# Patient Record
Sex: Female | Born: 1980
Health system: Southern US, Community
[De-identification: ages and names within clinical notes are randomized; demographics above are authoritative.]

## PROBLEM LIST (undated history)

## (undated) DIAGNOSIS — I10 Essential (primary) hypertension: Secondary | ICD-10-CM

## (undated) DIAGNOSIS — E119 Type 2 diabetes mellitus without complications: Secondary | ICD-10-CM

## (undated) DIAGNOSIS — E785 Hyperlipidemia, unspecified: Secondary | ICD-10-CM

## (undated) HISTORY — DX: Hyperlipidemia, unspecified: E78.5

## (undated) HISTORY — PX: OTHER SURGICAL HISTORY: SHX169

## (undated) HISTORY — DX: Essential (primary) hypertension: I10

## (undated) HISTORY — PX: CHOLECYSTECTOMY: SHX55

## (undated) HISTORY — DX: Type 2 diabetes mellitus without complications: E11.9

---
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
## 2001-12-02 ENCOUNTER — Ambulatory Visit (HOSPITAL_COMMUNITY): Admission: RE | Admit: 2001-12-02 | Discharge: 2001-12-02 | Payer: Self-pay

## 2002-01-02 ENCOUNTER — Ambulatory Visit (HOSPITAL_COMMUNITY): Admission: RE | Admit: 2002-01-02 | Discharge: 2002-01-02 | Payer: Self-pay

## 2002-01-07 ENCOUNTER — Encounter: Payer: Self-pay | Admitting: Emergency Medicine

## 2002-01-07 ENCOUNTER — Emergency Department (HOSPITAL_COMMUNITY): Admission: EM | Admit: 2002-01-07 | Discharge: 2002-01-07 | Payer: Self-pay | Admitting: Emergency Medicine

## 2002-03-17 ENCOUNTER — Encounter (HOSPITAL_COMMUNITY): Admission: RE | Admit: 2002-03-17 | Discharge: 2002-04-16 | Payer: Self-pay

## 2002-04-03 ENCOUNTER — Inpatient Hospital Stay (HOSPITAL_COMMUNITY): Admission: AD | Admit: 2002-04-03 | Discharge: 2002-04-06 | Payer: Self-pay

## 2002-04-13 ENCOUNTER — Encounter: Admission: RE | Admit: 2002-04-13 | Discharge: 2002-04-13 | Payer: Self-pay | Admitting: *Deleted

## 2002-04-17 ENCOUNTER — Encounter (HOSPITAL_COMMUNITY): Admission: RE | Admit: 2002-04-17 | Discharge: 2002-05-17 | Payer: Self-pay

## 2002-05-18 ENCOUNTER — Encounter (HOSPITAL_COMMUNITY): Admission: RE | Admit: 2002-05-18 | Discharge: 2002-05-22 | Payer: Self-pay

## 2002-05-24 ENCOUNTER — Inpatient Hospital Stay (HOSPITAL_COMMUNITY): Admission: AD | Admit: 2002-05-24 | Discharge: 2002-05-26 | Payer: Self-pay

## 2002-06-10 ENCOUNTER — Emergency Department (HOSPITAL_COMMUNITY): Admission: EM | Admit: 2002-06-10 | Discharge: 2002-06-11 | Payer: Self-pay | Admitting: Emergency Medicine

## 2002-06-11 ENCOUNTER — Encounter: Payer: Self-pay | Admitting: Emergency Medicine

## 2002-07-05 ENCOUNTER — Ambulatory Visit (HOSPITAL_COMMUNITY): Admission: RE | Admit: 2002-07-05 | Discharge: 2002-07-06 | Payer: Self-pay | Admitting: General Surgery

## 2002-07-05 ENCOUNTER — Encounter (INDEPENDENT_AMBULATORY_CARE_PROVIDER_SITE_OTHER): Payer: Self-pay | Admitting: Specialist

## 2002-07-05 ENCOUNTER — Encounter: Payer: Self-pay | Admitting: General Surgery

## 2002-09-05 ENCOUNTER — Other Ambulatory Visit: Admission: RE | Admit: 2002-09-05 | Discharge: 2002-09-05 | Payer: Self-pay

## 2004-05-09 ENCOUNTER — Other Ambulatory Visit: Admission: RE | Admit: 2004-05-09 | Discharge: 2004-05-09 | Payer: Self-pay | Admitting: Obstetrics and Gynecology

## 2004-07-28 ENCOUNTER — Inpatient Hospital Stay (HOSPITAL_COMMUNITY): Admission: AD | Admit: 2004-07-28 | Discharge: 2004-07-28 | Payer: Self-pay | Admitting: Obstetrics and Gynecology

## 2004-08-06 ENCOUNTER — Inpatient Hospital Stay (HOSPITAL_COMMUNITY): Admission: AD | Admit: 2004-08-06 | Discharge: 2004-08-06 | Payer: Self-pay | Admitting: Obstetrics and Gynecology

## 2004-08-11 ENCOUNTER — Inpatient Hospital Stay (HOSPITAL_COMMUNITY): Admission: AD | Admit: 2004-08-11 | Discharge: 2004-08-11 | Payer: Self-pay | Admitting: Obstetrics and Gynecology

## 2004-08-13 ENCOUNTER — Inpatient Hospital Stay (HOSPITAL_COMMUNITY): Admission: AD | Admit: 2004-08-13 | Discharge: 2004-08-13 | Payer: Self-pay | Admitting: Obstetrics and Gynecology

## 2004-08-19 ENCOUNTER — Inpatient Hospital Stay (HOSPITAL_COMMUNITY): Admission: AD | Admit: 2004-08-19 | Discharge: 2004-08-23 | Payer: Self-pay | Admitting: Obstetrics and Gynecology

## 2004-09-25 ENCOUNTER — Other Ambulatory Visit: Admission: RE | Admit: 2004-09-25 | Discharge: 2004-09-25 | Payer: Self-pay | Admitting: Obstetrics and Gynecology

## 2005-09-18 ENCOUNTER — Encounter: Admission: RE | Admit: 2005-09-18 | Discharge: 2005-09-18 | Payer: Self-pay | Admitting: Cardiovascular Disease

## 2006-09-10 ENCOUNTER — Other Ambulatory Visit: Admission: RE | Admit: 2006-09-10 | Discharge: 2006-09-10 | Payer: Self-pay | Admitting: Obstetrics and Gynecology

## 2008-12-26 ENCOUNTER — Emergency Department (HOSPITAL_COMMUNITY): Admission: EM | Admit: 2008-12-26 | Discharge: 2008-12-26 | Payer: Self-pay | Admitting: Family Medicine

## 2009-01-01 ENCOUNTER — Emergency Department (HOSPITAL_COMMUNITY): Admission: EM | Admit: 2009-01-01 | Discharge: 2009-01-01 | Payer: Self-pay | Admitting: Emergency Medicine

## 2009-01-05 ENCOUNTER — Emergency Department (HOSPITAL_COMMUNITY): Admission: EM | Admit: 2009-01-05 | Discharge: 2009-01-05 | Payer: Self-pay | Admitting: Emergency Medicine

## 2010-06-27 NOTE — Discharge Summary (Signed)
   NAME:  Lynn Anderson, Lynn Anderson                       ACCOUNT NO.:  [id number]   MEDICAL RECORD NO.:  [id number]                   PATIENT TYPE:  INP   LOCATION:  9155                                 FACILITY:  [facility]   PHYSICIAN:  Ronda Fairly. Galen Daft, M.D.              DATE OF BIRTH:  04/19/1980   DATE OF ADMISSION:  04/03/2002  DATE OF DISCHARGE:  04/06/2002                                 DISCHARGE SUMMARY   ADMISSION DIAGNOSIS:  Abnormal Doppler's.   PRINCIPAL DIAGNOSIS:  Abnormal Doppler's.   SECONDARY DIAGNOSIS:  Intrauterine growth restriction and oligohydramnios.   FINAL DIAGNOSIS:  Intrauterine growth restriction ad oligohydramnios.   COMPLICATIONS OF HOSPITALIZATION:  None.   CONDITION AT DISCHARGE:  Improved.   HOSPITAL COURSE:  The patient was admitted  on 04/03/02 for non reassuring  antenatal evaluation with oligohydramnios by ultrasound report and abnormal  umbilical artery Doppler's.  The patient had normal cerebral artery  Doppler's and the ultrasound fetal weight showed 1500-1600 grams with a  grade 2 placenta, this was 04/03/02.  The amniotic fluid index was 10.3 cm  but this was felt to be just above the fifth percentile.  The Doppler's  again were slightly abnormal.  The patient had no other extrinsic factors  and internal fetal medicine consult was performed during her  hospitalization, and NST testing and biophysical profile.  The patient did  well in the hospital, her tracing improved and she was discharged home on  the 26th.  We did a full evaluation and viral studies were performed.  All  of these results came back negative including hepatitis and lupus studies.  The patient did have slight anemia with a hemoglobin of 11.1 grams, a  slightly elevated white count at 13,000 but otherwise unremarkable labs.  Her creatinine clearance was normal after correction from the lab.  The  initial studies in the hospital suggested that there was a very low  creatinine clearance  but this was corrected and actually turned out to be a  high creatinine clearance when the math was improved.  The patient was given  TORCH titers also which were negative, and herpes was negative.  The patient  did have anticardiolipin's, they were negative.  ANA was negative.  The  patient was given instructions for medications and full instructions for  activity, wound care and follow up in the office.                                                Ronda Fairly. Galen Daft, M.D.    NJT/MEDQ  D:  05/05/2002  T:  05/06/2002  Job:  [id number]

## 2010-06-27 NOTE — Discharge Summary (Signed)
[name], Lynn Anderson NO.:  [id number]   MEDICAL RECORD NO.:  [id number]          PATIENT TYPE:  INP   LOCATION:  9121                          FACILITY:  [facility]   PHYSICIAN:  Rudy Jew. Ashley Royalty, M.D.DATE OF BIRTH:  [birth year]   DATE OF ADMISSION:  08/19/2004  DATE OF DISCHARGE:  08/23/2004                                 DISCHARGE SUMMARY   DISCHARGE DIAGNOSES:  1.  Transient hypertension.  2.  Normal spontaneous vaginal delivery of viable infant over intact      perineum.   HISTORY OF PRESENT ILLNESS:  The patient is a 30 year old gravida 3, para 1-  0-1-1 with an EDC of August 30, 2004.  Risk factors include late presentation  for prenatal care at 24 weeks.   LABORATORY DATA:  Blood type B positive, antibody screen negative, RPR,  HBsAg, HIV nonreactive.   HOSPITAL COURSE AND TREATMENT:  The patient was admitted August 19, 2004 for  induction of labor secondary to recent transient blood pressure elevations.  On admission, blood pressure was 140/84.  Cervical exam: Cervix was 4 cm,  75%, -2 to 3, vertex.  Rupture of membranes revealed clear fluid.  The  patient's labor was induced, PIH panel was obtained which was reassuring.  Uric acid 10.3.  The patient was delivered of a 7 pound, 9 ounce female  infant, Apgars 8 and 9 over intact perineum.  Nuchal cord.  There was  minimal right-sided periurethral __________  requiring a single suture.  Postpartum course was complicated by elevated blood pressure of 159/108.  The patient was sent to ICU for magnesium sulfate therapy and also labetalol  100 mg p.o. b.i.d.  On August 21, 2004, the [facility] panel was within normal  limits.  Uric acid had decreased.  The patient was stable to be discharged  in satisfactory condition on August 23, 2004.   DISPOSITION:  Followup in the office on July 17, 18 for blood pressure  check.  Labetalol 300 mg q.a.m., 200 in p.m.  Percocet, 5/3.25 15.  Motrin  600 mg q.i.d.      Elwyn Lade .  Hancock, N.P.    ______________________________  Rudy Jew Ashley Royalty, M.D.    MKH/MEDQ  D:  10/01/2004  T:  10/01/2004  Job:  [id number]

## 2010-06-27 NOTE — Op Note (Signed)
NAME:  Lynn Anderson, Lynn Anderson NO.:  [id number]   MEDICAL RECORD NO.:  [id number]                   PATIENT TYPE:  EMS   LOCATION:  MINO                                 FACILITY:  [facility]   PHYSICIAN:  Gita Kudo, M.D.              DATE OF BIRTH:  April 18, 1980   DATE OF PROCEDURE:  07/05/2002  DATE OF DISCHARGE:  06/11/2002                                 OPERATIVE REPORT   PROCEDURE:  Laparoscopic cholecystectomy.   SURGEON:  Gita Kudo, M.D.   ASSISTANT:  Donnie Coffin. Samuella Cota, M.D.   ANESTHESIA:  General endotracheal.   PREOPERATIVE DIAGNOSIS:  Gallstones.   POSTOPERATIVE DIAGNOSIS:  Gallstones.   INDICATIONS FOR PROCEDURE:  This 30 year old female recently pregnant,  developed abdominal pain.  A gallbladder ultrasound showed stones.  ALT was  elevated slightly, and other liver function studies were normal.   FINDINGS:  The gallbladder was thin-walled.  The cystic duct and artery were  normal in anatomy and in size.  A cholangiogram was normal to me.   DESCRIPTION OF PROCEDURE:  Under satisfactory general endotracheal  anesthesia, having received 1.0 g of Ancef, the patient's abdomen was  prepped and draped in a standard fashion.  A midline incision was made at  the umbilicus and carried into the peritoneum.  Controlled with a figure-of-  eight #0 Vicryl suture.  The operating Hussan port was inserted  and good  CO2 pneumoperitoneum established.  The camera was placed, and under direct  vision, through Marcaine-infiltrated incisions, two #5 ports were placed  laterally and a second #10 medially.  With good visualization, the graspers  through the lateral port gave excellent exposure, and we carefully dissected  the cystic duct and artery.  When these structures were circumferentially  identified, the artery was controlled with multiple metal clips, and a  single clip placed on the cystic duct near the gallbladder.  An incision was  made in the  cystic duct and a transcutaneous catheter passed, and  cholangiograms taken.  After interpreting them, the catheter was withdrawn,  and the duct controlled with multiple metal clips, and it and the artery  were divided.  The gallbladder was then dissected off of the liver bed from  below upwards using coagulating dissection for hemostasis.  When removed,  the liver bed was made dry by cautery, and lavaged with saline.  The camera moved to the upper port, and through the lower port a large  grasper was used to extract the gallbladder intact, and without spillage or  problem.  All sites were removed under direct vision after the CO2 was  released, and the area checked for hemostasis and lavaged with saline.  Then  the midline incision was infiltrated with Marcaine  and closed with the previous figure-of-eight, and a second interrupted #0  Vicryl.  The subcutaneous approximated with #4-0 Vicryl and Steri-Strips  applied to all incisions.  Dressings were  applied.  The patient went to the recovery room from the operating room in good  condition, without complications.                                                Gita Kudo, M.D.    MRL/MEDQ  D:  07/05/2002  T:  07/05/2002  Job:  [id number]

## 2010-07-24 IMAGING — CT CT HEAD W/O CM
1 of 2 series · 13 of 30 positions shown, 17 images · non-contrast
Comparison: 09/18/2005

CLINICAL DATA: Headaches, sinusitis

CT HEAD WITHOUT CONTRAST
TECHNIQUE: Contiguous axial images were obtained from the base of
the skull through the vertex without contrast.

[Series 2: brain · axial · 0.47mm/px · z∈[+129,+254]mm · 13 of 28 slices shown, 17 images]
[im 2/28  brain]
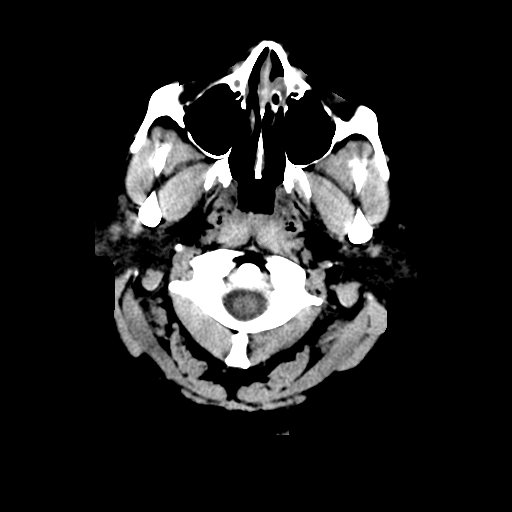
[im 2/28  bone]
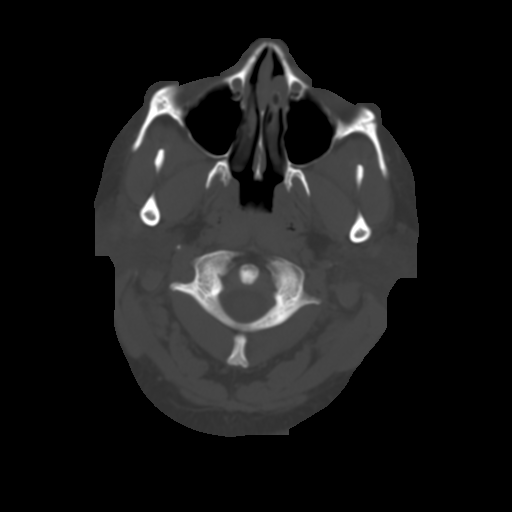
[im 4/28  brain]
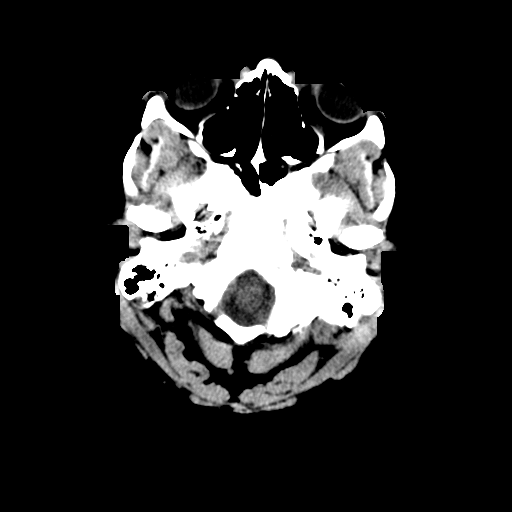
[im 6/28  brain]
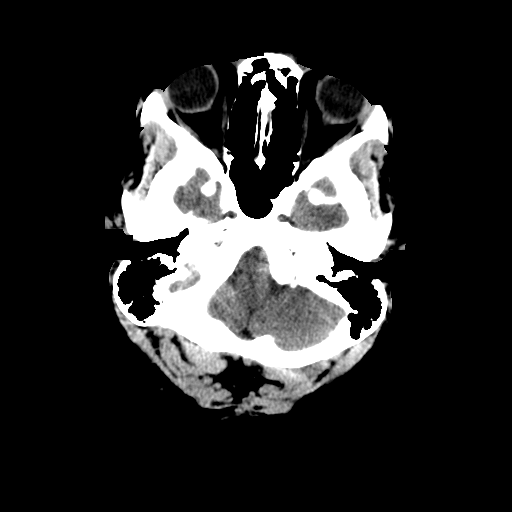
[im 8/28  brain]
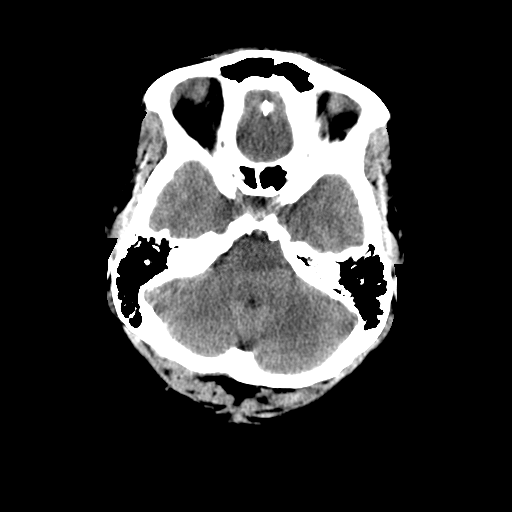
[im 10/28  brain]
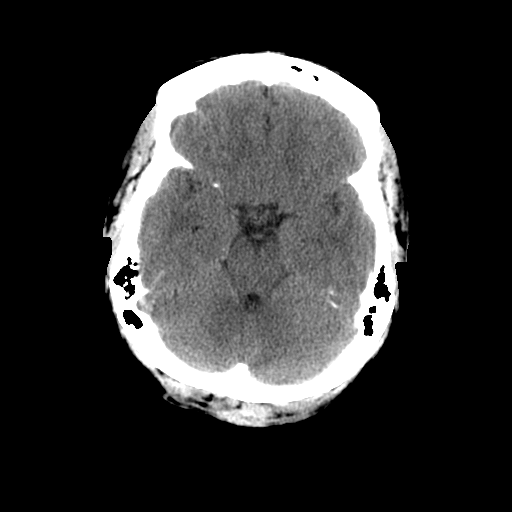
[im 10/28  bone]
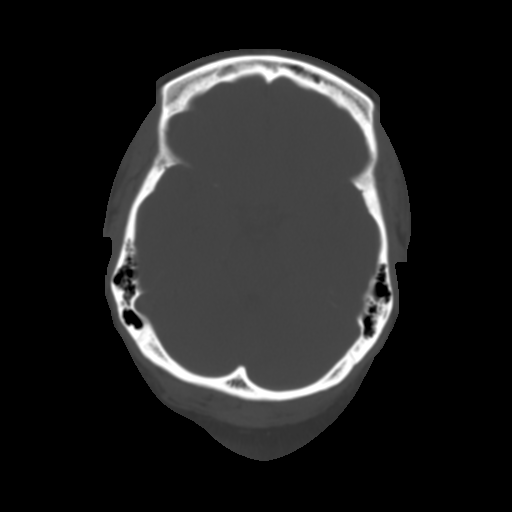
[im 12/28  brain]
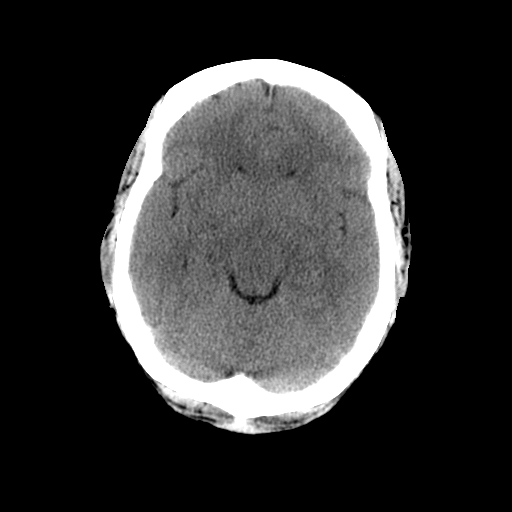
[im 14/28  brain]
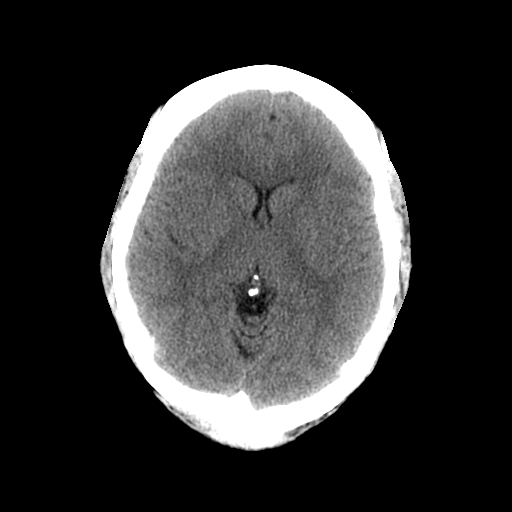
[im 16/28  brain]
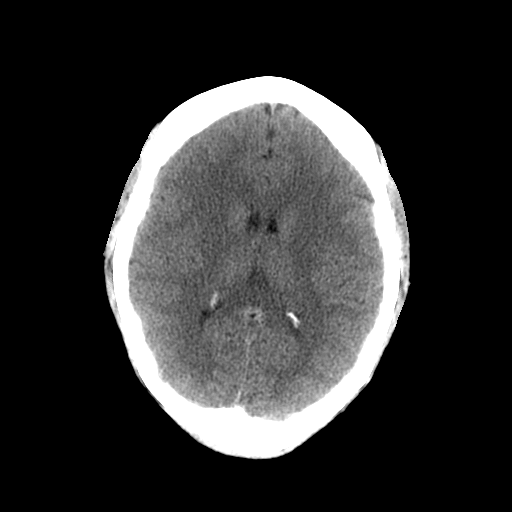
[im 18/28  brain]
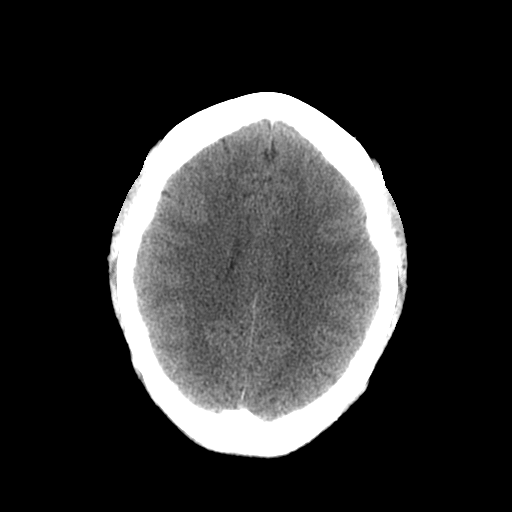
[im 18/28  bone]
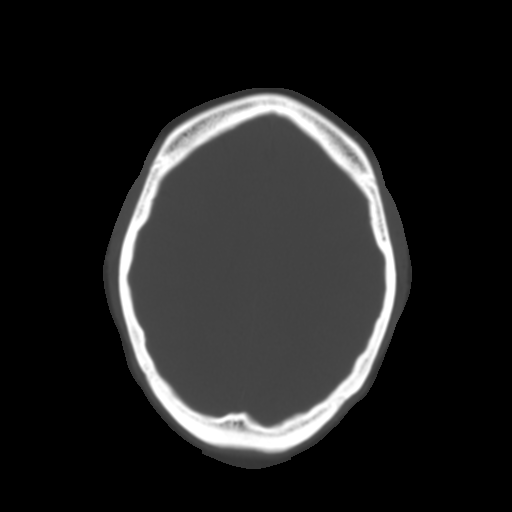
[im 20/28  brain]
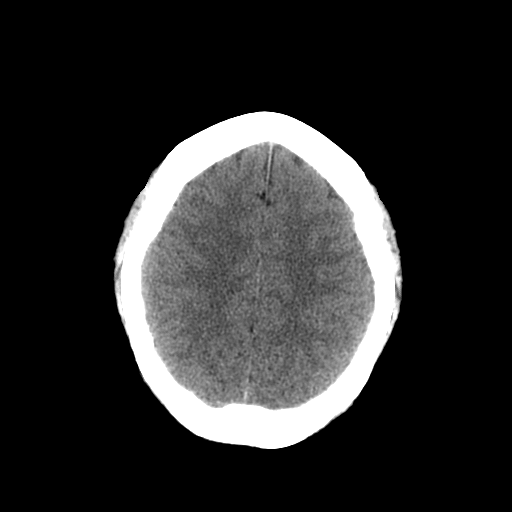
[im 22/28  brain]
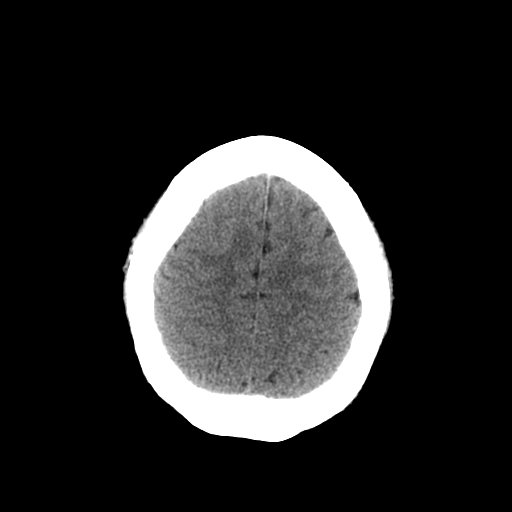
[im 24/28  brain]
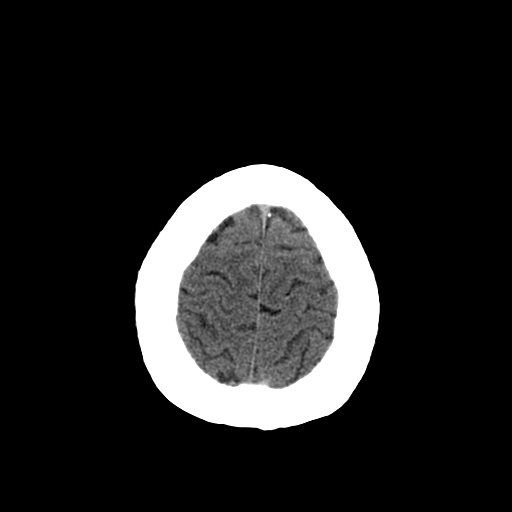
[im 26/28  brain]
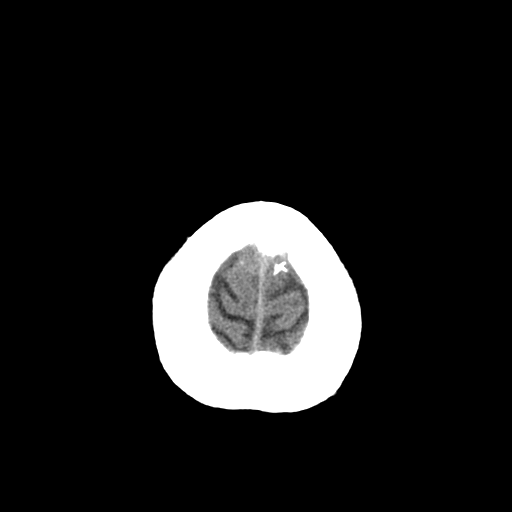
[im 26/28  bone]
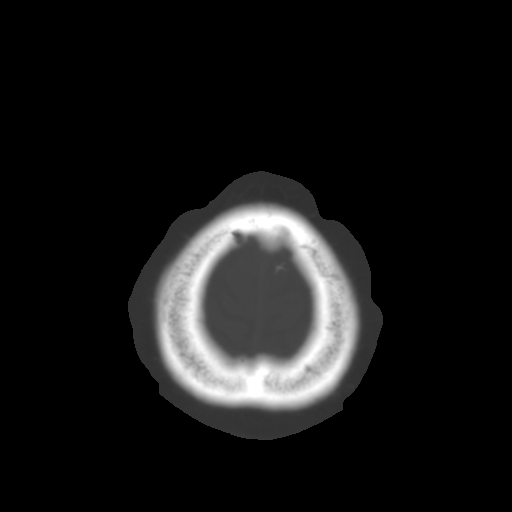

[13 of 30 positions shown; findings below may reference images not displayed]

FINDINGS: Normal ventricular morphology.
No midline shift or mass effect.
Normal appearance of brain parenchyma.
No intracranial hemorrhage, mass lesion, or acute infarction.
Visualized paranasal sinuses and mastoid air cells clear.
Bones unremarkable.
IMPRESSION: No acute intracranial abnormalities.

## 2014-09-28 ENCOUNTER — Ambulatory Visit (INDEPENDENT_AMBULATORY_CARE_PROVIDER_SITE_OTHER): Payer: Commercial Managed Care - PPO | Admitting: Women's Health

## 2014-09-28 ENCOUNTER — Encounter: Payer: Self-pay | Admitting: Women's Health

## 2014-09-28 ENCOUNTER — Other Ambulatory Visit (HOSPITAL_COMMUNITY)
Admission: RE | Admit: 2014-09-28 | Discharge: 2014-09-28 | Disposition: A | Discharge status: Home / Self Care | Payer: Commercial Managed Care - PPO | Source: Ambulatory Visit | Attending: Women's Health | Admitting: Women's Health

## 2014-09-28 VITALS — BP 127/86 | Ht 68.0 in | Wt 300.2 lb

## 2014-09-28 DIAGNOSIS — Z01419 Encounter for gynecological examination (general) (routine) without abnormal findings: Secondary | ICD-10-CM | POA: Insufficient documentation

## 2014-09-28 DIAGNOSIS — N898 Other specified noninflammatory disorders of vagina: Secondary | ICD-10-CM | POA: Diagnosis not present

## 2014-09-28 DIAGNOSIS — N760 Acute vaginitis: Secondary | ICD-10-CM | POA: Diagnosis not present

## 2014-09-28 DIAGNOSIS — A499 Bacterial infection, unspecified: Secondary | ICD-10-CM | POA: Diagnosis not present

## 2014-09-28 DIAGNOSIS — Z13220 Encounter for screening for lipoid disorders: Secondary | ICD-10-CM | POA: Diagnosis not present

## 2014-09-28 DIAGNOSIS — Z1151 Encounter for screening for human papillomavirus (HPV): Secondary | ICD-10-CM | POA: Diagnosis present

## 2014-09-28 DIAGNOSIS — B9689 Other specified bacterial agents as the cause of diseases classified elsewhere: Secondary | ICD-10-CM | POA: Insufficient documentation

## 2014-09-28 LAB — CBC WITH DIFFERENTIAL/PLATELET
Basophils Absolute: 0 10*3/uL (ref 0.0–0.1)
Basophils Relative: 0 % (ref 0–1)
Eosinophils Absolute: 0.1 10*3/uL (ref 0.0–0.7)
Eosinophils Relative: 1 % (ref 0–5)
HCT: 43 % (ref 36.0–46.0)
Hemoglobin: 14 g/dL (ref 12.0–15.0)
Lymphocytes Relative: 30 % (ref 12–46)
Lymphs Abs: 2.3 10*3/uL (ref 0.7–4.0)
MCH: 25.6 pg — ABNORMAL LOW (ref 26.0–34.0)
MCHC: 32.6 g/dL (ref 30.0–36.0)
MCV: 78.6 fL (ref 78.0–100.0)
MPV: 10.7 fL (ref 8.6–12.4)
Monocytes Absolute: 0.6 10*3/uL (ref 0.1–1.0)
Monocytes Relative: 8 % (ref 3–12)
Neutro Abs: 4.6 10*3/uL (ref 1.7–7.7)
Neutrophils Relative %: 61 % (ref 43–77)
Platelets: 232 10*3/uL (ref 150–400)
RBC: 5.47 MIL/uL — ABNORMAL HIGH (ref 3.87–5.11)
RDW: 13.8 % (ref 11.5–15.5)
WBC: 7.5 10*3/uL (ref 4.0–10.5)

## 2014-09-28 LAB — COMPREHENSIVE METABOLIC PANEL
ALT: 60 U/L — ABNORMAL HIGH (ref 6–29)
AST: 58 U/L — ABNORMAL HIGH (ref 10–30)
Albumin: 3.9 g/dL (ref 3.6–5.1)
Alkaline Phosphatase: 68 U/L (ref 33–115)
BUN: 8 mg/dL (ref 7–25)
CO2: 21 mmol/L (ref 20–31)
Calcium: 9 mg/dL (ref 8.6–10.2)
Chloride: 101 mmol/L (ref 98–110)
Creat: 0.67 mg/dL (ref 0.50–1.10)
Glucose, Bld: 247 mg/dL — ABNORMAL HIGH (ref 65–99)
Potassium: 4.4 mmol/L (ref 3.5–5.3)
Sodium: 135 mmol/L (ref 135–146)
Total Bilirubin: 0.4 mg/dL (ref 0.2–1.2)
Total Protein: 6.9 g/dL (ref 6.1–8.1)

## 2014-09-28 LAB — WET PREP FOR TRICH, YEAST, CLUE
Trich, Wet Prep: NONE SEEN
Yeast Wet Prep HPF POC: NONE SEEN

## 2014-09-28 LAB — LIPID PANEL
Cholesterol: 264 mg/dL — ABNORMAL HIGH (ref 125–200)
HDL: 47 mg/dL (ref >=46)
LDL Cholesterol: 167 mg/dL — ABNORMAL HIGH (ref <130)
Total CHOL/HDL Ratio: 5.6 Ratio — ABNORMAL HIGH (ref <=5.0)
Triglycerides: 248 mg/dL — ABNORMAL HIGH (ref <150)
VLDL: 50 mg/dL — ABNORMAL HIGH (ref <30)

## 2014-09-28 MED ORDER — METRONIDAZOLE 500 MG PO TABS: 500.0000 mg | ORAL_TABLET | Freq: Two times a day (BID) | ORAL | Status: DC

## 2014-09-28 NOTE — Patient Instructions (Signed)

## 2014-09-28 NOTE — Progress Notes (Signed)
Lynn Anderson Oct 31, 1980 [id number]    History:    Presents for annual exam.  Mirena IUD placed 09/2009 [facility], (records reviewed) amenorrhea. Reports normal Paps. Same partner, history of positive Trichomonas 2014.  Past medical history, past surgical history, family history and social history were all reviewed and documented in the EPIC chart. 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 2 daughters ages 10 and 65 both doing well. Mother hypertension, father died of heart disease.  ROS:  A ROS was performed and pertinent positives and negatives are included.  Exam:  Filed Vitals:   09/28/14 1045  BP: 127/86    General appearance:  Normal Thyroid:  Symmetrical, normal in size, without palpable masses or nodularity. Respiratory  Auscultation:  Clear without wheezing or rhonchi Cardiovascular  Auscultation:  Regular rate, without rubs, murmurs or gallops  Edema/varicosities:  Not grossly evident Abdominal  Soft,nontender, without masses, guarding or rebound.  Liver/spleen:  No organomegaly noted  Hernia:  None appreciated  Skin  Inspection:  Grossly normal   Breasts: Examined lying and sitting.     Right: Without masses, retractions, discharge or axillary adenopathy.     Left: Without masses, retractions, discharge or axillary adenopathy. Gentitourinary   Inguinal/mons:  Normal without inguinal adenopathy  External genitalia:  Normal  BUS/Urethra/Skene's glands:  Normal  Vagina:  Normal  Cervix:  Normal IUD strings visible  Uterus:   normal in size, shape and contour.  Midline and mobile  Adnexa/parametria:     Rt: Without masses or tenderness.   Lt: Without masses or tenderness.  Anus and perineum: Normal  Digital rectal exam: Normal sphincter tone without palpated masses or tenderness  Assessment/Plan:  34 y.o. SBF G2 P2 for new patient annual exam  requesting IUD to be removed and replaced.  09/19/2009  Mirena  IUD/amenorrhea Morbid obesity Bacteria vaginosis  Plan: We'll check coverage for Mirena IUD return to office for Dr. Lily Peer to remove and replace. Flagyl 500 twice daily for 7 days #14, alcohol precautions reviewed. GC/Chlamydia culture pending. Denies need for HIV, hepatitis or RPR. SBE's, annual screening mammogram at 40, calcium rich diet, vitamin D 1000 daily encouraged. CBC, lipid panel, CMP, TSH, UA, Pap with HR HPV typing, new screening guidelines reviewed. Reviewed importance of increasing exercise and decreasing calories for weight loss for health.  Harrington Challenger WHNP, 1:52 PM 09/28/2014

## 2014-09-29 LAB — GC/CHLAMYDIA PROBE AMP
CT Probe RNA: NEGATIVE
GC Probe RNA: NEGATIVE

## 2014-09-29 LAB — URINALYSIS W MICROSCOPIC + REFLEX CULTURE
Bilirubin Urine: NEGATIVE
Casts: NONE SEEN [LPF]
Crystals: NONE SEEN [HPF]
Hgb urine dipstick: NEGATIVE
Ketones, ur: NEGATIVE
Leukocytes, UA: NEGATIVE
Nitrite: NEGATIVE
Protein, ur: NEGATIVE
RBC / HPF: NONE SEEN RBC/HPF (ref <=2)
Specific Gravity, Urine: 1.018 (ref 1.001–1.035)
Squamous Epithelial / LPF: NONE SEEN [HPF] (ref <=5)
Yeast: NONE SEEN [HPF]
pH: 5.5 (ref 5.0–8.0)

## 2014-09-29 LAB — TSH: TSH: 2.622 u[IU]/mL (ref 0.350–4.500)

## 2014-09-30 LAB — URINE CULTURE
Colony Count: NO GROWTH
Organism ID, Bacteria: NO GROWTH

## 2014-10-01 ENCOUNTER — Telehealth: Payer: Self-pay | Admitting: Gynecology

## 2014-10-01 LAB — CYTOLOGY - PAP

## 2014-10-01 NOTE — Telephone Encounter (Signed)
10/01/14-Pt was notified today that her UMR ins covers the Mirena & insertion for contraception at 100%, no copay. She has appt set up with JF for insertion/wl

## 2014-10-02 ENCOUNTER — Telehealth: Payer: Self-pay | Admitting: *Deleted

## 2014-10-02 DIAGNOSIS — R7301 Impaired fasting glucose: Secondary | ICD-10-CM

## 2014-10-02 DIAGNOSIS — R81 Glycosuria: Secondary | ICD-10-CM

## 2014-10-02 NOTE — Telephone Encounter (Signed)
Referral placed they will contact pt to schedule. 

## 2014-10-02 NOTE — Telephone Encounter (Signed)
-----   Message from Keenan Bachelor, [place] sent at 10/01/2014  4:39 PM EDT ----- Regarding: referral to endocrinology ASAP per [facility] Per Harriett Sine "Please call and review blood sugar diabetic range, triglcerides also high which often are high with high blood sugars, also glucose in urine. 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 She will wait to hear date/time. (She does not have PCP .)

## 2014-10-03 NOTE — Telephone Encounter (Signed)
Appointment 11/05/14 @ 11:15am

## 2014-10-16 ENCOUNTER — Encounter: Payer: Self-pay | Admitting: Gynecology

## 2014-10-16 ENCOUNTER — Ambulatory Visit (INDEPENDENT_AMBULATORY_CARE_PROVIDER_SITE_OTHER): Payer: Commercial Managed Care - PPO | Admitting: Gynecology

## 2014-10-16 VITALS — BP 138/86

## 2014-10-16 DIAGNOSIS — Z30430 Encounter for insertion of intrauterine contraceptive device: Secondary | ICD-10-CM

## 2014-10-16 DIAGNOSIS — Z975 Presence of (intrauterine) contraceptive device: Secondary | ICD-10-CM | POA: Insufficient documentation

## 2014-10-16 NOTE — Patient Instructions (Signed)
Levonorgestrel intrauterine device (IUD) What is this medicine? LEVONORGESTREL IUD (LEE voe nor jes trel) is a contraceptive (birth control) device. The device is placed inside the uterus by a healthcare professional. It is used to prevent pregnancy and can also be used to treat heavy bleeding that occurs during your period. Depending on the device, it can be used for 3 to 5 years. This medicine may be used for other purposes; ask your health care provider or pharmacist if you have questions. COMMON BRAND NAME(S): LILETTA, Mirena, Skyla What should I tell my health care provider before I take this medicine? They need to know if you have any of these conditions: -abnormal Pap smear -cancer of the breast, uterus, or cervix -diabetes -endometritis -genital or pelvic infection now or in the past -have more than one sexual partner or your partner has more than one partner -heart disease -history of an ectopic or tubal pregnancy -immune system problems -IUD in place -liver disease or tumor -problems with blood clots or take blood-thinners -use intravenous drugs -uterus of unusual shape -vaginal bleeding that has not been explained -an unusual or allergic reaction to levonorgestrel, other hormones, silicone, or polyethylene, medicines, foods, dyes, or preservatives -pregnant or trying to get pregnant -breast-feeding How should I use this medicine? This device is placed inside the uterus by a health care professional. Talk to your pediatrician regarding the use of this medicine in children. Special care may be needed. Overdosage: If you think you have taken too much of this medicine contact a poison control center or emergency room at once. NOTE: This medicine is only for you. Do not share this medicine with others. What if I miss a dose? This does not apply. What may interact with this medicine? Do not take this medicine with any of the following  medications: -amprenavir -bosentan -fosamprenavir This medicine may also interact with the following medications: -aprepitant -barbiturate medicines for inducing sleep or treating seizures -bexarotene -griseofulvin -medicines to treat seizures like carbamazepine, ethotoin, felbamate, oxcarbazepine, phenytoin, topiramate -modafinil -pioglitazone -rifabutin -rifampin -rifapentine -some medicines to treat HIV infection like atazanavir, indinavir, lopinavir, nelfinavir, tipranavir, ritonavir -St. John's wort -warfarin This list may not describe all possible interactions. Give your health care provider a list of all the medicines, herbs, non-prescription drugs, or dietary supplements you use. Also tell them if you smoke, drink alcohol, or use illegal drugs. Some items may interact with your medicine. What should I watch for while using this medicine? Visit your doctor or health care professional for regular check ups. See your doctor if you or your partner has sexual contact with others, becomes HIV positive, or gets a sexual transmitted disease. This product does not protect you against HIV infection (AIDS) or other sexually transmitted diseases. You can check the placement of the IUD yourself by reaching up to the top of your vagina with clean fingers to feel the threads. Do not pull on the threads. It is a good habit to check placement after each menstrual period. Call your doctor right away if you feel more of the IUD than just the threads or if you cannot feel the threads at all. The IUD may come out by itself. You may become pregnant if the device comes out. If you notice that the IUD has come out use a backup birth control method like condoms and call your health care provider. Using tampons will not change the position of the IUD and are okay to use during your period. What side effects may   I notice from receiving this medicine? Side effects that you should report to your doctor or  health care professional as soon as possible: -allergic reactions like skin rash, itching or hives, swelling of the face, lips, or tongue -fever, flu-like symptoms -genital sores -high blood pressure -no menstrual period for 6 weeks during use -pain, swelling, warmth in the leg -pelvic pain or tenderness -severe or sudden headache -signs of pregnancy -stomach cramping -sudden shortness of breath -trouble with balance, talking, or walking -unusual vaginal bleeding, discharge -yellowing of the eyes or skin Side effects that usually do not require medical attention (report to your doctor or health care professional if they continue or are bothersome): -acne -breast pain -change in sex drive or performance -changes in weight -cramping, dizziness, or faintness while the device is being inserted -headache -irregular menstrual bleeding within first 3 to 6 months of use -nausea This list may not describe all possible side effects. Call your doctor for medical advice about side effects. You may report side effects to FDA at 1-800-FDA-1088. Where should I keep my medicine? This does not apply. NOTE: This sheet is a summary. It may not cover all possible information. If you have questions about this medicine, talk to your doctor, pharmacist, or health care provider.  2015, Elsevier/Gold Standard. (2011-02-26 13:54:04)  

## 2014-10-16 NOTE — Progress Notes (Signed)
   34 year old patient who presented to the office to remove her expired Mirena IUD and replaced with a new one. This is patient's third Mirena IUD and has done well.                                                                    IUD procedure note       Patient presented to the office today for placement of Mirena IUD. The patient had previously been provided with literature information on this method of contraception. The risks benefits and pros and cons were discussed and all her questions were answered. She is fully aware that this form of contraception is 99% effective and is good for 5 years.  Pelvic exam: Bartholin urethra Skene glands: Within normal limits Vagina: No lesions or discharge Cervix: No lesions or discharge Uterus: Anteverted position Adnexa: No masses or tenderness Rectal exam: Not done  The cervix was cleansed with Betadine solution. A single-tooth tenaculum was placed on the anterior cervical lip. The uterus sounded to 7-1/2 centimeter. The IUD was shown to the patient and inserted in a sterile fashion. The IUD string was trimmed. The single-tooth tenaculum was removed. Patient was instructed to return back to the office in one month for follow up.     Mirena IUD inserted 10/16/2014 good for 5 years. Lot number [id number]

## 2014-11-05 ENCOUNTER — Other Ambulatory Visit: Payer: Self-pay | Admitting: *Deleted

## 2014-11-05 ENCOUNTER — Ambulatory Visit (INDEPENDENT_AMBULATORY_CARE_PROVIDER_SITE_OTHER): Payer: Commercial Managed Care - PPO | Admitting: Internal Medicine

## 2014-11-05 ENCOUNTER — Encounter: Payer: Self-pay | Admitting: Internal Medicine

## 2014-11-05 ENCOUNTER — Other Ambulatory Visit (INDEPENDENT_AMBULATORY_CARE_PROVIDER_SITE_OTHER): Payer: Commercial Managed Care - PPO | Admitting: *Deleted

## 2014-11-05 VITALS — BP 122/60 | HR 83 | Temp 98.1°F | Resp 12 | Ht 68.0 in | Wt 300.0 lb

## 2014-11-05 DIAGNOSIS — R739 Hyperglycemia, unspecified: Secondary | ICD-10-CM

## 2014-11-05 DIAGNOSIS — E1165 Type 2 diabetes mellitus with hyperglycemia: Secondary | ICD-10-CM | POA: Insufficient documentation

## 2014-11-05 LAB — POCT GLYCOSYLATED HEMOGLOBIN (HGB A1C): Hemoglobin A1C: 9.4

## 2014-11-05 MED ORDER — GLUCOSE BLOOD VI STRP: ORAL_STRIP | Status: AC

## 2014-11-05 MED ORDER — METFORMIN HCL 500 MG PO TABS: 500.0000 mg | ORAL_TABLET | Freq: Two times a day (BID) | ORAL | Status: DC

## 2014-11-05 MED ORDER — METFORMIN HCL 500 MG PO TABS: 1000.0000 mg | ORAL_TABLET | Freq: Two times a day (BID) | ORAL | Status: DC

## 2014-11-05 MED ORDER — ONETOUCH DELICA LANCETS FINE MISC: Status: AC

## 2014-11-05 NOTE — Patient Instructions (Signed)
Please start Metformin 500 mg with dinner x 4 days. If you tolerate this well, add another Metformin tablet (500 mg) with breakfast x 4 days. If you tolerate this well, add another metformin tablet with dinner (total 1000 mg) x 4 days. If you tolerate this well, add another metformin tablet with breakfast (total 1000 mg). Continue with 1000 mg of metformin 2x a day with breakfast and dinner.  Please stop drinking sweet tea.  Please return in 1.5 months with your sugar log.  Please check sugars 1-2x a day, rotating check times.  Please let me know if the sugars are consistently <80 or >200.  Please schedule an appt with Oran Rein with nutrition.  Please schedule an appt with Cristy Folks for diabetes education.  PATIENT INSTRUCTIONS FOR TYPE 2 DIABETES:  **Please join MyChart!** - see attached instructions about how to join if you have not done so already.  DIET AND EXERCISE Diet and exercise is an important part of diabetic treatment.  We recommended aerobic exercise in the form of brisk walking (working between 40-60% of maximal aerobic capacity, similar to brisk walking) for 150 minutes per week (such as 30 minutes five days per week) along with 3 times per week performing 'resistance' training (using various gauge rubber tubes with handles) 5-10 exercises involving the major muscle groups (upper body, lower body and core) performing 10-15 repetitions (or near fatigue) each exercise. Start at half the above goal but build slowly to reach the above goals. If limited by weight, joint pain, or disability, we recommend daily walking in a swimming pool with water up to waist to reduce pressure from joints while allow for adequate exercise.    BLOOD GLUCOSES Monitoring your blood glucoses is important for continued management of your diabetes. Please check your blood glucoses 2-4 times a day: fasting, before meals and at bedtime (you can rotate these measurements - e.g. one day check before the  3 meals, the next day check before 2 of the meals and before bedtime, etc.).   HYPOGLYCEMIA (low blood sugar) Hypoglycemia is usually a reaction to not eating, exercising, or taking too much insulin/ other diabetes drugs.  Symptoms include tremors, sweating, hunger, confusion, headache, etc. Treat IMMEDIATELY with 15 grams of Carbs: . 4 glucose tablets .  cup regular juice/soda . 2 tablespoons raisins . 4 teaspoons sugar . 1 tablespoon honey Recheck blood glucose in 15 mins and repeat above if still symptomatic/blood glucose <100.  RECOMMENDATIONS TO REDUCE YOUR RISK OF DIABETIC COMPLICATIONS: * Take your prescribed MEDICATION(S) * Follow a DIABETIC diet: Complex carbs, fiber rich foods, (monounsaturated and polyunsaturated) fats * AVOID saturated/trans fats, high fat foods, >2,300 mg salt per day. * EXERCISE at least 5 times a week for 30 minutes or preferably daily.  * DO NOT SMOKE OR DRINK more than 1 drink a day. * Check your FEET every day. Do not wear tightfitting shoes. Contact us if you develop an ulcer * See your EYE doctor once a year or more if needed * Get a FLU shot once a year * Get a PNEUMONIA vaccine once before and once after age 31 years  GOALS:  * Your Hemoglobin A1c of <7%  * fasting sugars need to be <130 * after meals sugars need to be <180 (2h after you start eating) * Your Systolic BP should be 140 or lower  * Your Diastolic BP should be 80 or lower  * Your HDL (Good Cholesterol) should be 40 or higher  *  Your LDL (Bad Cholesterol) should be 100 or lower. * Your Triglycerides should be 150 or lower  * Your Urine microalbumin (kidney function) should be <30 * Your Body Mass Index should be 25 or lower    Please consider the following ways to cut down carbs and fat and increase fiber and micronutrients in your diet: - substitute whole grain for white bread or pasta - substitute brown rice for white rice - substitute 90-calorie flat bread pieces for  slices of bread when possible - substitute sweet potatoes or yams for white potatoes - substitute humus for margarine - substitute tofu for cheese when possible - substitute almond or rice milk for regular milk (would not drink soy milk daily due to concern for soy estrogen influence on breast cancer risk) - substitute dark chocolate for other sweets when possible - substitute water - can add lemon or orange slices for taste - for diet sodas (artificial sweeteners will trick your body that you can eat sweets without getting calories and will lead you to overeating and weight gain in the long run) - do not skip breakfast or other meals (this will slow down the metabolism and will result in more weight gain over time)  - can try smoothies made from fruit and almond/rice milk in am instead of regular breakfast - can also try old-fashioned (not instant) oatmeal made with almond/rice milk in am - order the dressing on the side when eating salad at a restaurant (pour less than half of the dressing on the salad) - eat as little meat as possible - can try juicing, but should not forget that juicing will get rid of the fiber, so would alternate with eating raw veg./fruits or drinking smoothies - use as little oil as possible, even when using olive oil - can dress a salad with a mix of balsamic vinegar and lemon juice, for e.g. - use agave nectar, stevia sugar, or regular sugar rather than artificial sweateners - steam or broil/roast veggies  - snack on veggies/fruit/nuts (unsalted, preferably) when possible, rather than processed foods - reduce or eliminate aspartame in diet (it is in diet sodas, chewing gum, etc) Read the labels!  Try to read Dr. Katherina Right book: "Program for Reversing Diabetes" for other ideas for healthy eating.

## 2014-11-05 NOTE — Progress Notes (Signed)
Patient ID: Lynn Anderson, female   DOB: Jun 18, 1980, 34 y.o.   MRN: [id number]  HPI: Lynn Anderson is a 34 y.o.-year-old female, referred by Lynn Rowan, NP (Dr Reynaldo Minium), for management of DM2, new dx.  She started to see ObGyn in 09/2014 >> Glucose checked and this was elevated, 247. No increased thirst or urination, no blurry vision, no weight loss.   No previous HbA1c.   Pt is not on any meds for DM yet.  Pt does not check sugars. - am: n/c - 2h after b'fast: n/c - before lunch: n/c - 2h after lunch: n/c - before dinner: n/c - 2h after dinner: n/c - bedtime: n/c - nighttime: n/c  Glucometer: none  Pt's meals are: - Breakfast: grits, hard boiled egg, cereal (cereals) - or skips - Lunch: sandwich - Dinner: spaghetti, parmesan chicken - Snacks: 2x: chips No more sodas, + sweet tea.   - no CKD, last BUN/creatinine:  Lab Results  Component Value Date   BUN 8 09/28/2014   CREATININE 0.67 09/28/2014   - last set of lipids: Lab Results  Component Value Date   CHOL 264* 09/28/2014   HDL 47 09/28/2014   LDLCALC 167* 09/28/2014   TRIG 248* 09/28/2014   CHOLHDL 5.6* 09/28/2014   - last eye exam was in "several year". No DR.  - no numbness and tingling in her feet.  Pt has FH of DM in aunt, mother, MGM.  She also has HTN and HL.  ROS: Constitutional: no weight gain/loss, no fatigue, no subjective hyperthermia/hypothermia Eyes: no blurry vision, no xerophthalmia ENT: no sore throat, no nodules palpated in throat, no dysphagia/odynophagia, no hoarseness Cardiovascular: no CP/SOB/palpitations/leg swelling Respiratory: no cough/SOB Gastrointestinal: no N/V/D/C Musculoskeletal: no muscle/joint aches Skin: no rashes Neurological: no tremors/numbness/tingling/dizziness Psychiatric: no depression/anxiety  Past Medical History  Diagnosis Date  . Hypertension   . Hyperlipidemia    Past Surgical History  Procedure Laterality Date  . Gallbladder surgery   2004   Social History   Social History  . Marital Status: Single    Spouse Name: N/A  . Number of Children: 2   Occupational History  . Surgical tech   Social History Main Topics  . Smoking status: Never Smoker   . Smokeless tobacco: Not on file  . Alcohol Use: No  . Drug Use: No  . Sexual Activity: Yes    Birth Control/ Protection: Condom   No current outpatient prescriptions on file prior to visit.   No current facility-administered medications on file prior to visit.   No Known Allergies Family History  Problem Relation Age of Onset  . Hyperlipidemia Mother   . Hypertension Mother   . Diabetes Mother   . Diabetes Maternal Grandmother   . Heart disease Father   . Diabetes Maternal Aunt    PE: BP 122/60 mmHg  Pulse 83  Temp(Src) 98.1 F (36.7 C) (Oral)  Resp 12  Ht  (1.727 m)  Wt 300 lb (136.079 kg)  BMI 45.63 kg/m2  SpO2 97% Wt Readings from Last 3 Encounters:  11/05/14 300 lb (136.079 kg)  09/28/14 300 lb 3.2 oz (136.17 kg)   Constitutional: Obese, in NAD Eyes: PERRLA, EOMI, no exophthalmos ENT: moist mucous membranes, no thyromegaly, no cervical lymphadenopathy Cardiovascular: RRR, No MRG Respiratory: CTA B Gastrointestinal: abdomen soft, NT, ND, BS+ Musculoskeletal: no deformities, strength intact in all 4 Skin: moist, warm, no rashes, + acanthosis Lynn JOSEPHSeck and face Neurological: no tremor with outstretched  hands, DTR normal in all 4  ASSESSMENT: 1. DM2, new dx  PLAN:  1. Patient with new dx of diabetes, likely type 2 based on body habitus (obese) and presence of acanthosis nigricans on face and neck. We checked a hemoglobin A1c at the time of the visit, and this returned high, at 9.4%, confirming a diagnosis of diabetes.  - We discussed about short and long term complications of diabetes (renal, ophthalmic, neurologic, cardiovascular complications, as well as hypo-and hyperglycemia). I advised her that we need to maintain her  hemoglobin A1c at or lower than 7% to avoid developing these complications in the future.  I explained what the hemoglobin A1c means. - We discussed about the importance of Diet and exercise and I strongly advised her to stop sweet tea - will refer her to nutrition and diabetes education.  - We discussed about options for treatment, and I suggested to start metformin - I advised her of possible side effects from metformin and how to avoid them.   Patient Instructions  Please start Metformin 500 mg with dinner x 4 days. If you tolerate this well, add another Metformin tablet (500 mg) with breakfast x 4 days. If you tolerate this well, add another metformin tablet with dinner (total 1000 mg) x 4 days. If you tolerate this well, add another metformin tablet with breakfast (total 1000 mg). Continue with 1000 mg of metformin 2x a day with breakfast and dinner.  Please stop drinking sweet tea.  Please return in 1.5 months with your sugar log.  Please check sugars 1-2x a day, rotating check times.  Please let me know if the sugars are consistently <80 or >200.  Please schedule an appt with Oran Rein with nutrition.  Please schedule an appt with Cristy Folks for diabetes education.  - Strongly advised her to start checking sugars at different times of the day - check 1-2 times a day, rotating checks - We gave her a glucometer (One Touch Verio flex) and demonstrated use; will send a prescription for test strips to her pharmacy. - given sugar log and advised how to fill it and to bring it at next appt  - given foot care handout and explained the principles  - given instructions for hypoglycemia management "15-15 rule"  - advised for yearly eye exams - I strongly advised her to establish care with a PCP  - Return to clinic in 1.5 mo with sugar log   - time spent with the patient: 1 hour, of which >50% was spent in obtaining information about her new dx of Diabetes, reviewing her previous labs, and  evaluations, counseling her about her condition (please see the discussed topics above), and developing a plan to further investigate it; she had a number of questions which I addressed.

## 2014-11-14 ENCOUNTER — Encounter: Payer: Self-pay | Admitting: Gynecology

## 2014-11-14 ENCOUNTER — Ambulatory Visit (INDEPENDENT_AMBULATORY_CARE_PROVIDER_SITE_OTHER): Payer: Commercial Managed Care - PPO | Admitting: Gynecology

## 2014-11-14 VITALS — BP 134/86

## 2014-11-14 DIAGNOSIS — Z30431 Encounter for routine checking of intrauterine contraceptive device: Secondary | ICD-10-CM | POA: Diagnosis not present

## 2014-11-14 NOTE — Progress Notes (Signed)
   34 she'll patient presented to the office today for her 1 month follow-up after having placed the Mirena IUD. This is patient's third Mirena IUD she's been very successful with it. She has no complaints today.  Exam: Blood pressure 134/86 Gen. appearance well-developed and nourished female in no acute distress Abdomen: Soft nontender no rebound or guarding Pelvic: Bartholin urethra Skene was within normal limits Vagina: No lesions or discharge Cervix: No lesions or discharge IUD string visualized Uterus: Anteverted normal size shape and consistency Adnexa: No palpable masses or tenderness Rectal exam: Not done  Assessment/plan: Patient one month status post replacement of Mirena IUD doing well. Patient otherwise scheduled to return back next year for annual exam or when necessary. Patient was offered the flu vaccine but stated that she will receive it at her job next week.

## 2014-11-16 ENCOUNTER — Ambulatory Visit: Payer: Commercial Managed Care - PPO | Admitting: Gynecology

## 2014-11-19 ENCOUNTER — Encounter: Payer: Commercial Managed Care - PPO | Admitting: Nutrition

## 2014-11-29 ENCOUNTER — Encounter: Payer: Commercial Managed Care - PPO | Admitting: Dietician

## 2014-12-18 ENCOUNTER — Ambulatory Visit (INDEPENDENT_AMBULATORY_CARE_PROVIDER_SITE_OTHER): Payer: Commercial Managed Care - PPO | Admitting: Internal Medicine

## 2014-12-18 ENCOUNTER — Encounter: Payer: Self-pay | Admitting: Internal Medicine

## 2014-12-18 ENCOUNTER — Other Ambulatory Visit (INDEPENDENT_AMBULATORY_CARE_PROVIDER_SITE_OTHER): Payer: Commercial Managed Care - PPO | Admitting: *Deleted

## 2014-12-18 VITALS — BP 118/76 | HR 94 | Temp 98.3°F | Resp 12 | Wt 303.0 lb

## 2014-12-18 DIAGNOSIS — E1165 Type 2 diabetes mellitus with hyperglycemia: Secondary | ICD-10-CM | POA: Diagnosis not present

## 2014-12-18 LAB — POCT GLYCOSYLATED HEMOGLOBIN (HGB A1C): Hemoglobin A1C: 8.3

## 2014-12-18 MED ORDER — METFORMIN HCL ER 500 MG PO TB24: 500.0000 mg | ORAL_TABLET | Freq: Every day | ORAL | Status: DC

## 2014-12-18 NOTE — Patient Instructions (Signed)
Please switch from regular Metformin to Metformin ER 500 mg 2x a day.  Continue to check sugars 1-2x a day.  Please schedule a new eye exam.  Please let me know if the sugars are consistently <80 or >200.  Please come back for a follow-up appointment in 3 months.

## 2014-12-18 NOTE — Progress Notes (Signed)
Patient ID: Lynn Anderson, female   DOB: Dec 15, 1980, 34 y.o.   MRN: [id number]  HPI: Lynn Anderson is a 34 y.o.-year-old female, initially referred by Maryelizabeth RowanNancy Young, NP (Dr Reynaldo MiniumJuan Fernandez), for management of DM2, new dx.   I reviewed and addended hx: She started to see ObGyn in 09/2014 >> Glucose checked and this was elevated, 247. No increased thirst or urination, no blurry vision, no weight loss.   Lab Results  Component Value Date   HGBA1C 9.4 11/05/2014   Pt is on: - Metformin 500 mg once a day in am for 1 mo (diarrhea with 500 mg bid), does not take it when she works - works in ED Sat and Sun  Pt checks sugars 1-3x a day: - am: n/c >> 131-173, 209 - 2h after b'fast: n/c - before lunch: n/c >> 131-194 - 2h after lunch: n/c >> 208 - before dinner: n/c >> 131, 145 - 2h after dinner: n/c - bedtime: n/c - nighttime: n/c  Glucometer: One Touch Verio, but test strips too expensive, using her mother's glucometer  Pt's meals are: - Breakfast: grits, hard boiled egg, cereal (cereals) - or skips - Lunch: sandwich - Dinner: spaghetti, parmesan chicken - Snacks: 2x: chips No more sodas, stopped sweet tea.   - no CKD, last BUN/creatinine:  Lab Results  Component Value Date   BUN 8 09/28/2014   CREATININE 0.67 09/28/2014   - last set of lipids: Lab Results  Component Value Date   CHOL 264* 09/28/2014   HDL 47 09/28/2014   LDLCALC 167* 09/28/2014   TRIG 248* 09/28/2014   CHOLHDL 5.6* 09/28/2014   - last eye exam was "several years ago". No DR.  - no numbness and tingling in her feet.  She also has HTN and HL.  ROS: Constitutional: no weight gain/loss, no fatigue, no subjective hyperthermia/hypothermia Eyes: no blurry vision, no xerophthalmia ENT: no sore throat, no nodules palpated in throat, no dysphagia/odynophagia, no hoarseness Cardiovascular: no CP/SOB/palpitations/leg swelling Respiratory: no cough/SOB Gastrointestinal: no N/V/+ D/C Musculoskeletal: no  muscle/joint aches Skin: no rashes Neurological: no tremors/numbness/tingling/dizziness  I reviewed pt's medications, allergies, PMH, social hx, family hx, and changes were documented in the history of present illness. Otherwise, unchanged from my initial visit note:  Past Medical History  Diagnosis Date  . Hypertension   . Hyperlipidemia    Past Surgical History  Procedure Laterality Date  . Gallbladder surgery  2004   Social History   Social History  . Marital Status: Single    Spouse Name: N/A  . Number of Children: 2   Occupational History  . Surgical tech   Social History Main Topics  . Smoking status: Never Smoker   . Smokeless tobacco: Not on file  . Alcohol Use: No  . Drug Use: No  . Sexual Activity: Yes    Birth Control/ Protection: Condom   Current Outpatient Prescriptions on File Prior to Visit  Medication Sig Dispense Refill  . ibuprofen (ADVIL,MOTRIN) 200 MG tablet Take 200 mg by mouth every 6 (six) hours as needed.    . metFORMIN (GLUCOPHAGE) 500 MG tablet Take 2 tablets (1,000 mg total) by mouth 2 (two) times daily with a meal. 120 tablet 1  . glucose blood (ONETOUCH VERIO) test strip Use to test blood sugar 2 times daily as instructed (Patient not taking: Reported on 12/18/2014) 100 each 2  . ONETOUCH DELICA LANCETS FINE MISC Use to test blood sugar 2 times daily as instructed. (Patient not  taking: Reported on 12/18/2014) 100 each 2   No current facility-administered medications on file prior to visit.   No Known Allergies Family History  Problem Relation Age of Onset  . Hyperlipidemia Mother   . Hypertension Mother   . Diabetes Mother   . Diabetes Maternal Grandmother   . Heart disease Father   . Diabetes Maternal Aunt    PE: BP 118/76 mmHg  Pulse 94  Temp(Src) 98.3 F (36.8 C) (Oral)  Resp 12  Wt 303 lb (137.44 kg)  SpO2 97% Body mass index is 46.08 kg/(m^2). Wt Readings from Last 3 Encounters:  12/18/14 303 lb (137.44 kg)  11/05/14 300 lb  (136.079 kg)  09/28/14 300 lb 3.2 oz (136.17 kg)   Constitutional: Obese, in NAD Eyes: PERRLA, EOMI, no exophthalmos ENT: moist mucous membranes, no thyromegaly, no cervical lymphadenopathy Cardiovascular: RRR, No MRG Respiratory: CTA B Gastrointestinal: abdomen soft, NT, ND, BS+ Musculoskeletal: no deformities, strength intact in all 4 Skin: moist, warm, no rashes, + acanthosis nigricans on neck and face Neurological: no tremor with outstretched hands, DTR normal in all 4  ASSESSMENT: 1. DM2, new dx  PLAN:  1. Patient with new dx of diabetes, likely type 2 based on body habitus (obese) and presence of acanthosis nigricans on face and neck. We checked a hemoglobin A1c at last visit >> returned high, at 9.4%, confirming a diagnosis of diabetes.  - At last visit, we discussed about the importance of Diet and exercise and I strongly advised her to stop sweet tea >> she did this and sugars are much better! We also started metformin, but she could not tolerate more than 500 mg a day and only ~5x a week. We will try to switch to Metformin ER and if she cannot tolerate this, try a DPP4 inhibitor. - At last visit, I also referred her to nutrition and diabetes education. She did not have these appointments yet.  - I advised her to:  Patient Instructions  Please switch from regular Metformin to Metformin ER 500 mg 2x a day.  Continue to check sugars 1-2x a day.  Please schedule a new eye exam.  Please let me know if the sugars are consistently <80 or >200.  Please come back for a follow-up appointment in 3 months.  - Strongly advised her to start checking sugars at different times of the day - check 1-2 times a day, rotating checks - advised for yearly eye exams >> need to schedule - will repeat her HbA1c today to confirm DM2 >> 8.3% (better) - Return to clinic in 3 mo with sugar log

## 2015-03-20 ENCOUNTER — Ambulatory Visit: Payer: Commercial Managed Care - PPO | Admitting: Internal Medicine

## 2015-07-21 ENCOUNTER — Other Ambulatory Visit: Payer: Self-pay | Admitting: Internal Medicine

## 2015-10-01 ENCOUNTER — Ambulatory Visit (INDEPENDENT_AMBULATORY_CARE_PROVIDER_SITE_OTHER): Payer: Commercial Managed Care - PPO | Admitting: Women's Health

## 2015-10-01 ENCOUNTER — Other Ambulatory Visit: Payer: Self-pay | Admitting: Women's Health

## 2015-10-01 ENCOUNTER — Encounter: Payer: Self-pay | Admitting: Women's Health

## 2015-10-01 VITALS — BP 130/84 | Ht 68.0 in | Wt 297.0 lb

## 2015-10-01 DIAGNOSIS — Z01419 Encounter for gynecological examination (general) (routine) without abnormal findings: Secondary | ICD-10-CM

## 2015-10-01 NOTE — Progress Notes (Signed)
Lynn Anderson 09-10-1980 [id number]    History:    Presents for annual exam. Amenorrheic on Mirena IUD placed 10/2014. Has one day of light spotting every 3-4 months. Same partner. Normal Pap history. Diabetes on metformin per primary care.  Past medical history, past surgical history, family history and social history were all reviewed and documented in the EPIC chart. 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 Daughters  ages 5011 and 7313 both doing well. Mother hypertension and diabetes, father died from heart disease, alcohol and smoking.  ROS:  A ROS was performed and pertinent positives and negatives are included.  Exam:  Vitals:   10/01/15 1007  BP: 130/84  Weight: 297 lb (134.7 kg)  Height: 5\' 8"  (1.727 m)   Body mass index is 45.16 kg/m.   General appearance:  Normal Thyroid:  Symmetrical, normal in size, without palpable masses or nodularity. Respiratory  Auscultation:  Clear without wheezing or rhonchi Cardiovascular  Auscultation:  Regular rate, without rubs, murmurs or gallops  Edema/varicosities:  Not grossly evident Abdominal  Soft,nontender, without masses, guarding or rebound.  Liver/spleen:  No organomegaly noted  Hernia:  None appreciated  Skin  Inspection:  Grossly normal   Breasts: Examined lying and sitting.     Right: Without masses, retractions, discharge or axillary adenopathy.     Left: Without masses, retractions, discharge or axillary adenopathy. Gentitourinary   Inguinal/mons:  Normal without inguinal adenopathy  External genitalia:  Normal  BUS/Urethra/Skene's glands:  Normal  Vagina:  Normal  Cervix:  Normal IUD strings visible  Uterus:  normal in size, shape and contour.  Midline and mobile  Adnexa/parametria:     Rt: Without masses or tenderness.   Lt: Without masses or tenderness.  Anus and perineum: Normal  Digital rectal exam: Normal sphincter tone without palpated masses or tenderness  Assessment/Plan:  35  y.o. SBF G2 P2  for annual exam with no complaints.  10/2014 Mirena IUD-amenorrhea Type 2 diabetes on metformin  labs and meds per primary care Obesity  Plan: SBE's, annual screening mammogram at 40, calcium rich diet, vitamin D 1000 daily encouraged. Reviewed importance of increasing regular exercise and decreasing calories/carbs for weight loss. UA, Pap normal with negative HR HPV 2016, new screening guidelines reviewed.    Lynn Anderson,Lynn Anderson [facility]NP, 10:39 AM 10/01/2015

## 2015-10-01 NOTE — Patient Instructions (Signed)
Health Maintenance, Female Adopting a healthy lifestyle and getting preventive care can go a long way to promote health and wellness. Talk with your health care provider about what schedule of regular examinations is right for you. This is a good chance for you to check in with your provider about disease prevention and staying healthy. In between checkups, there are plenty of things you can do on your own. Experts have done a lot of research about which lifestyle changes and preventive measures are most likely to keep you healthy. Ask your health care provider for more information. WEIGHT AND DIET  Eat a healthy diet  Be sure to include plenty of vegetables, fruits, low-fat dairy products, and lean protein.  Do not eat a lot of foods high in solid fats, added sugars, or salt.  Get regular exercise. This is one of the most important things you can do for your health.  Most adults should exercise for at least 150 minutes each week. The exercise should increase your heart rate and make you sweat (moderate-intensity exercise).  Most adults should also do strengthening exercises at least twice a week. This is in addition to the moderate-intensity exercise.  Maintain a healthy weight  Body mass index (BMI) is a measurement that can be used to identify possible weight problems. It estimates body fat based on height and weight. Your health care provider can help determine your BMI and help you achieve or maintain a healthy weight.  For females 20 years of age and older:   A BMI below 18.5 is considered underweight.  A BMI of 18.5 to 24.9 is normal.  A BMI of 25 to 29.9 is considered overweight.  A BMI of 30 and above is considered obese.  Watch levels of cholesterol and blood lipids  You should start having your blood tested for lipids and cholesterol at 37 years of age, then have this test every 5 years.  You may need to have your cholesterol levels checked more often if:  Your lipid  or cholesterol levels are high.  You are older than 37 years of age.  You are at high risk for heart disease.  CANCER SCREENING   Lung Cancer  Lung cancer screening is recommended for adults 55-80 years old who are at high risk for lung cancer because of a history of smoking.  A yearly low-dose CT scan of the lungs is recommended for people who:  Currently smoke.  Have quit within the past 15 years.  Have at least a 30-pack-year history of smoking. A pack year is smoking an average of one pack of cigarettes a day for 1 year.  Yearly screening should continue until it has been 15 years since you quit.  Yearly screening should stop if you develop a health problem that would prevent you from having lung cancer treatment.  Breast Cancer  Practice breast self-awareness. This means understanding how your breasts normally appear and feel.  It also means doing regular breast self-exams. Let your health care provider know about any changes, no matter how small.  If you are in your 20s or 30s, you should have a clinical breast exam (CBE) by a health care provider every 1-3 years as part of a regular health exam.  If you are 40 or older, have a CBE every year. Also consider having a breast X-ray (mammogram) every year.  If you have a family history of breast cancer, talk to your health care provider about genetic screening.  If you   are at high risk for breast cancer, talk to your health care provider about having an MRI and a mammogram every year.  Breast cancer gene (BRCA) assessment is recommended for women who have family members with BRCA-related cancers. BRCA-related cancers include:  Breast.  Ovarian.  Tubal.  Peritoneal cancers.  Results of the assessment will determine the need for genetic counseling and BRCA1 and BRCA2 testing. Cervical Cancer Your health care provider may recommend that you be screened regularly for cancer of the pelvic organs (ovaries, uterus, and  vagina). This screening involves a pelvic examination, including checking for microscopic changes to the surface of your cervix (Pap test). You may be encouraged to have this screening done every 3 years, beginning at age 21.  For women ages 30-65, health care providers may recommend pelvic exams and Pap testing every 3 years, or they may recommend the Pap and pelvic exam, combined with testing for human papilloma virus (HPV), every 5 years. Some types of HPV increase your risk of cervical cancer. Testing for HPV may also be done on women of any age with unclear Pap test results.  Other health care providers may not recommend any screening for nonpregnant women who are considered low risk for pelvic cancer and who do not have symptoms. Ask your health care provider if a screening pelvic exam is right for you.  If you have had past treatment for cervical cancer or a condition that could lead to cancer, you need Pap tests and screening for cancer for at least 20 years after your treatment. If Pap tests have been discontinued, your risk factors (such as having a new sexual partner) need to be reassessed to determine if screening should resume. Some women have medical problems that increase the chance of getting cervical cancer. In these cases, your health care provider may recommend more frequent screening and Pap tests. Colorectal Cancer  This type of cancer can be detected and often prevented.  Routine colorectal cancer screening usually begins at 37 years of age and continues through 37 years of age.  Your health care provider may recommend screening at an earlier age if you have risk factors for colon cancer.  Your health care provider may also recommend using home test kits to check for hidden blood in the stool.  A small camera at the end of a tube can be used to examine your colon directly (sigmoidoscopy or colonoscopy). This is done to check for the earliest forms of colorectal  cancer.  Routine screening usually begins at age 50.  Direct examination of the colon should be repeated every 5-10 years through 37 years of age. However, you may need to be screened more often if early forms of precancerous polyps or small growths are found. Skin Cancer  Check your skin from head to toe regularly.  Tell your health care provider about any new moles or changes in moles, especially if there is a change in a mole's shape or color.  Also tell your health care provider if you have a mole that is larger than the size of a pencil eraser.  Always use sunscreen. Apply sunscreen liberally and repeatedly throughout the day.  Protect yourself by wearing long sleeves, pants, a wide-brimmed hat, and sunglasses whenever you are outside. HEART DISEASE, DIABETES, AND HIGH BLOOD PRESSURE   High blood pressure causes heart disease and increases the risk of stroke. High blood pressure is more likely to develop in:  People who have blood pressure in the high end   of the normal range (130-139/85-89 mm Hg).  People who are overweight or obese.  People who are African American.  If you are 38-23 years of age, have your blood pressure checked every 3-5 years. If you are 61 years of age or older, have your blood pressure checked every year. You should have your blood pressure measured twice--once when you are at a hospital or clinic, and once when you are not at a hospital or clinic. Record the average of the two measurements. To check your blood pressure when you are not at a hospital or clinic, you can use:  An automated blood pressure machine at a pharmacy.  A home blood pressure monitor.  If you are between 45 years and 39 years old, ask your health care provider if you should take aspirin to prevent strokes.  Have regular diabetes screenings. This involves taking a blood sample to check your fasting blood sugar level.  If you are at a normal weight and have a low risk for diabetes,  have this test once every three years after 37 years of age.  If you are overweight and have a high risk for diabetes, consider being tested at a younger age or more often. PREVENTING INFECTION  Hepatitis B  If you have a higher risk for hepatitis B, you should be screened for this virus. You are considered at high risk for hepatitis B if:  You were born in a country where hepatitis B is common. Ask your health care provider which countries are considered high risk.  Your parents were born in a high-risk country, and you have not been immunized against hepatitis B (hepatitis B vaccine).  You have HIV or AIDS.  You use needles to inject street drugs.  You live with someone who has hepatitis B.  You have had sex with someone who has hepatitis B.  You get hemodialysis treatment.  You take certain medicines for conditions, including cancer, organ transplantation, and autoimmune conditions. Hepatitis C  Blood testing is recommended for:  Everyone born from [birth year] through 1967.  Anyone with known risk factors for hepatitis C. Sexually transmitted infections (STIs)  You should be screened for sexually transmitted infections (STIs) including gonorrhea and chlamydia if:  You are sexually active and are younger than 37 years of age.  You are older than 37 years of age and your health care provider tells you that you are at risk for this type of infection.  Your sexual activity has changed since you were last screened and you are at an increased risk for chlamydia or gonorrhea. Ask your health care provider if you are at risk.  If you do not have HIV, but are at risk, it may be recommended that you take a prescription medicine daily to prevent HIV infection. This is called pre-exposure prophylaxis (PrEP). You are considered at risk if:  You are sexually active and do not regularly use condoms or know the HIV status of your partner(s).  You take drugs by injection.  You are sexually  active with a partner who has HIV. Talk with your health care provider about whether you are at high risk of being infected with HIV. If you choose to begin PrEP, you should first be tested for HIV. You should then be tested every 3 months for as long as you are taking PrEP.  PREGNANCY   If you are premenopausal and you may become pregnant, ask your health care provider about preconception counseling.  If you may  become pregnant, take 400 to 800 micrograms (mcg) of folic acid every day.  If you want to prevent pregnancy, talk to your health care provider about birth control (contraception). OSTEOPOROSIS AND MENOPAUSE   Osteoporosis is a disease in which the bones lose minerals and strength with aging. This can result in serious bone fractures. Your risk for osteoporosis can be identified using a bone density scan.  If you are 42 years of age or older, or if you are at risk for osteoporosis and fractures, ask your health care provider if you should be screened.  Ask your health care provider whether you should take a calcium or vitamin D supplement to lower your risk for osteoporosis.  Menopause may have certain physical symptoms and risks.  Hormone replacement therapy may reduce some of these symptoms and risks. Talk to your health care provider about whether hormone replacement therapy is right for you.  HOME CARE INSTRUCTIONS   Schedule regular health, dental, and eye exams.  Stay current with your immunizations.   Do not use any tobacco products including cigarettes, chewing tobacco, or electronic cigarettes.  If you are pregnant, do not drink alcohol.  If you are breastfeeding, limit how much and how often you drink alcohol.  Limit alcohol intake to no more than 1 drink per day for nonpregnant women. One drink equals 12 ounces of beer, 5 ounces of wine, or 1 ounces of hard liquor.  Do not use street drugs.  Do not share needles.  Ask your health care provider for help if  you need support or information about quitting drugs.  Tell your health care provider if you often feel depressed.  Tell your health care provider if you have ever been abused or do not feel safe at home.   This information is not intended to replace advice given to you by your health care provider. Make sure you discuss any questions you have with your health care provider.   Document Released: 08/11/2010 Document Revised: 02/16/2014 Document Reviewed: 12/28/2012 Elsevier Interactive Patient Education 2016 [place] Carbohydrate Counting for Diabetes Mellitus Carbohydrate counting is a method for keeping track of the amount of carbohydrates you eat. Eating carbohydrates naturally increases the level of sugar (glucose) in your blood, so it is important for you to know the amount that is okay for you to have in every meal. Carbohydrate counting helps keep the level of glucose in your blood within normal limits. The amount of carbohydrates allowed is different for every person. A dietitian can help you calculate the amount that is right for you. Once you know the amount of carbohydrates you can have, you can count the carbohydrates in the foods you want to eat. Carbohydrates are found in the following foods:  Grains, such as breads and cereals.  Dried beans and soy products.  Starchy vegetables, such as potatoes, peas, and corn.  Fruit and fruit juices.  Milk and yogurt.  Sweets and snack foods, such as cake, cookies, candy, chips, soft drinks, and fruit drinks. CARBOHYDRATE COUNTING There are two ways to count the carbohydrates in your food. You can use either of the methods or a combination of both. Reading the "Nutrition Facts" on [place] The "Nutrition Facts" is an area that is included on the labels of almost all packaged food and beverages in the [place]. It includes the serving size of that food or beverage and information about the nutrients in each serving of  the food, including the grams (g)  of carbohydrate per serving.  Decide the number of servings of this food or beverage that you will be able to eat or drink. Multiply that number of servings by the number of grams of carbohydrate that is listed on the label for that serving. The total will be the amount of carbohydrates you will be having when you eat or drink this food or beverage. Learning Standard Serving Sizes of Food When you eat food that is not packaged or does not include "Nutrition Facts" on the label, you need to measure the servings in order to count the amount of carbohydrates.A serving of most carbohydrate-rich foods contains about 15 g of carbohydrates. The following list includes serving sizes of carbohydrate-rich foods that provide 15 g ofcarbohydrate per serving:   1 slice of bread (1 oz) or 1 six-inch tortilla.    of a hamburger bun or English muffin.  4-6 crackers.   cup unsweetened dry cereal.    cup hot cereal.   cup rice or pasta.    cup mashed potatoes or  of a large baked potato.  1 cup fresh fruit or one small piece of fruit.    cup canned or frozen fruit or fruit juice.  1 cup milk.   cup plain fat-free yogurt or yogurt sweetened with artificial sweeteners.   cup cooked dried beans or starchy vegetable, such as peas, corn, or potatoes.  Decide the number of standard-size servings that you will eat. Multiply that number of servings by 15 (the grams of carbohydrates in that serving). For example, if you eat 2 cups of strawberries, you will have eaten 2 servings and 30 g of carbohydrates (2 servings x 15 g = 30 g). For foods such as soups and casseroles, in which more than one food is mixed in, you will need to count the carbohydrates in each food that is included. EXAMPLE OF CARBOHYDRATE COUNTING Sample Dinner  3 oz chicken breast.   cup of brown rice.   cup of corn.  1 cup milk.   1 cup strawberries with sugar-free whipped topping.   Carbohydrate Calculation Step 1: Identify the foods that contain carbohydrates:   Rice.   Corn.   Milk.   Strawberries. Step 2:Calculate the number of servings eaten of each:   2 servings of rice.   1 serving of corn.   1 serving of milk.   1 serving of strawberries. Step 3: Multiply each of those number of servings by 15 g:   2 servings of rice x 15 g = 30 g.   1 serving of corn x 15 g = 15 g.   1 serving of milk x 15 g = 15 g.   1 serving of strawberries x 15 g = 15 g. Step 4: Add together all of the amounts to find the total grams of carbohydrates eaten: 30 g + 15 g + 15 g + 15 g = 75 g.   This information is not intended to replace advice given to you by your health care provider. Make sure you discuss any questions you have with your health care provider.   Document Released: 01/26/2005 Document Revised: 02/16/2014 Document Reviewed: 12/23/2012 Elsevier Interactive Patient Education [employer].

## 2015-10-02 LAB — URINALYSIS W MICROSCOPIC + REFLEX CULTURE
Bilirubin Urine: NEGATIVE
Casts: NONE SEEN [LPF]
Crystals: NONE SEEN [HPF]
Hgb urine dipstick: NEGATIVE
Ketones, ur: NEGATIVE
Nitrite: NEGATIVE
Protein, ur: NEGATIVE
Specific Gravity, Urine: 1.018 (ref 1.001–1.035)
Yeast: NONE SEEN [HPF]
pH: 5.5 (ref 5.0–8.0)

## 2015-10-05 LAB — URINE CULTURE

## 2015-10-11 ENCOUNTER — Other Ambulatory Visit: Payer: Self-pay | Admitting: Women's Health

## 2015-10-11 MED ORDER — NITROFURANTOIN MONOHYD MACRO 100 MG PO CAPS: 100.0000 mg | ORAL_CAPSULE | Freq: Two times a day (BID) | ORAL | 0 refills | Status: AC

## 2015-10-28 ENCOUNTER — Other Ambulatory Visit: Payer: Self-pay | Admitting: Internal Medicine

## 2015-10-28 ENCOUNTER — Other Ambulatory Visit: Payer: Self-pay

## 2015-12-22 ENCOUNTER — Other Ambulatory Visit: Payer: Self-pay | Admitting: Internal Medicine

## 2016-06-24 ENCOUNTER — Encounter: Payer: Self-pay | Admitting: Gynecology

## 2016-10-07 ENCOUNTER — Encounter: Payer: Self-pay | Admitting: Women's Health

## 2016-10-07 ENCOUNTER — Ambulatory Visit (INDEPENDENT_AMBULATORY_CARE_PROVIDER_SITE_OTHER): Payer: Commercial Managed Care - PPO | Admitting: Women's Health

## 2016-10-07 ENCOUNTER — Other Ambulatory Visit: Payer: Self-pay | Admitting: Women's Health

## 2016-10-07 VITALS — BP 132/84 | Ht 68.0 in | Wt 291.0 lb

## 2016-10-07 DIAGNOSIS — E1100 Type 2 diabetes mellitus with hyperosmolarity without nonketotic hyperglycemic-hyperosmolar coma (NKHHC): Secondary | ICD-10-CM

## 2016-10-07 DIAGNOSIS — Z13220 Encounter for screening for lipoid disorders: Secondary | ICD-10-CM | POA: Diagnosis not present

## 2016-10-07 DIAGNOSIS — Z01419 Encounter for gynecological examination (general) (routine) without abnormal findings: Secondary | ICD-10-CM

## 2016-10-07 LAB — CBC WITH DIFFERENTIAL/PLATELET
Basophils Absolute: 0 cells/uL (ref 0–200)
Basophils Relative: 0 %
Eosinophils Absolute: 96 cells/uL (ref 15–500)
Eosinophils Relative: 1 %
HCT: 42.8 % (ref 35.0–45.0)
Hemoglobin: 14.1 g/dL (ref 11.7–15.5)
Lymphocytes Relative: 27 %
Lymphs Abs: 2592 cells/uL (ref 850–3900)
MCH: 26.2 pg — ABNORMAL LOW (ref 27.0–33.0)
MCHC: 32.9 g/dL (ref 32.0–36.0)
MCV: 79.6 fL — ABNORMAL LOW (ref 80.0–100.0)
MPV: 10.7 fL (ref 7.5–12.5)
Monocytes Absolute: 672 cells/uL (ref 200–950)
Monocytes Relative: 7 %
Neutro Abs: 6240 cells/uL (ref 1500–7800)
Neutrophils Relative %: 65 %
Platelets: 247 10*3/uL (ref 140–400)
RBC: 5.38 MIL/uL — ABNORMAL HIGH (ref 3.80–5.10)
RDW: 13.7 % (ref 11.0–15.0)
WBC: 9.6 10*3/uL (ref 3.8–10.8)

## 2016-10-07 MED ORDER — METFORMIN HCL ER 500 MG PO TB24: ORAL_TABLET | ORAL | 0 refills | Status: DC

## 2016-10-07 NOTE — Progress Notes (Signed)
Lynn MelenaLakeisha R Anderson 11-21-1980 [id number]    History:    Presents for annual exam.  10/2014 Mirena IUD with rare spotting. Normal Pap history. Diabetes on Glucophage per endocrinologist, states has not been on Glucophage due to inability to return to office due to high cost. Same partner years.  Past medical history, past surgical history, family history and social history were all reviewed and documented in the EPIC chart. 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 Mother hypertension and diabetes, father heart disease deceased. 2 daughters ages 4512 and 6114.  ROS:  A ROS was performed and pertinent positives and negatives are included.  Exam:  Vitals:   10/07/16 1031  BP: 132/84  Weight: 291 lb (132 kg)  Height: 5\' 8"  (1.727 m)   Body mass index is 44.25 kg/m.   General appearance:  Normal Thyroid:  Symmetrical, normal in size, without palpable masses or nodularity. Respiratory  Auscultation:  Clear without wheezing or rhonchi Cardiovascular  Auscultation:  Regular rate, without rubs, murmurs or gallops  Edema/varicosities:  Not grossly evident Abdominal  Soft,nontender, without masses, guarding or rebound.  Liver/spleen:  No organomegaly noted  Hernia:  None appreciated  Skin  Inspection:  Grossly normal   Breasts: Examined lying and sitting.     Right: Without masses, retractions, discharge or axillary adenopathy.     Left: Without masses, retractions, discharge or axillary adenopathy. Gentitourinary   Inguinal/mons:  Normal without inguinal adenopathy  External genitalia:  Normal  BUS/Urethra/Skene's glands:  Normal  Vagina:  Normal  Cervix:  Normal IUD strings visible  Uterus:  normal in size, shape and contour.  Midline and mobile  Adnexa/parametria:     Rt: Without masses or tenderness.   Lt: Without masses or tenderness.  Anus and perineum: Normal  Digital rectal exam: Normal sphincter tone without palpated masses or tenderness  Assessment/Plan:   36 y.o. SBF G2 P2  for annual exam with no complaints.   10/2014 Mirena IUD rare spotting Type 2 diabetes without follow-up Morbid obesity  Plan: Reviewed importance of diabetes follow-up, will check labs today and will follow-up with primary care, prescription for Glucophage 500 XL prescription, proper use given 90 day supply only. SBE's, annual screening mammogram at 40, calcium rich diet, vitamin D 1000 daily encouraged. Reviewed importance of increasing exercise and decreasing calories/carbs for weight loss encouraged. CBC,  CMP, hemoglobin A1c, lipid panel, UA, Pap normal 2016, new screening guidelines reviewed.    Harrington Challengerancy J Kylina [facility] Orth[facility] SurgeryWHNP, 12:58 PM 10/07/2016

## 2016-10-07 NOTE — Patient Instructions (Signed)
Health Maintenance, Female Adopting a healthy lifestyle and getting preventive care can go a long way to promote health and wellness. Talk with your health care provider about what schedule of regular examinations is right for you. This is a good chance for you to check in with your provider about disease prevention and staying healthy. In between checkups, there are plenty of things you can do on your own. Experts have done a lot of research about which lifestyle changes and preventive measures are most likely to keep you healthy. Ask your health care provider for more information. Weight and diet Eat a healthy diet  Be sure to include plenty of vegetables, fruits, low-fat dairy products, and lean protein.  Do not eat a lot of foods high in solid fats, added sugars, or salt.  Get regular exercise. This is one of the most important things you can do for your health. ? Most adults should exercise for at least 150 minutes each week. The exercise should increase your heart rate and make you sweat (moderate-intensity exercise). ? Most adults should also do strengthening exercises at least twice a week. This is in addition to the moderate-intensity exercise.  Maintain a healthy weight  Body mass index (BMI) is a measurement that can be used to identify possible weight problems. It estimates body fat based on height and weight. Your health care provider can help determine your BMI and help you achieve or maintain a healthy weight.  For females 20 years of age and older: ? A BMI below 18.5 is considered underweight. ? A BMI of 18.5 to 24.9 is normal. ? A BMI of 25 to 29.9 is considered overweight. ? A BMI of 30 and above is considered obese.  Watch levels of cholesterol and blood lipids  You should start having your blood tested for lipids and cholesterol at 38 years of age, then have this test every 5 years.  You may need to have your cholesterol levels checked more often if: ? Your lipid or  cholesterol levels are high. ? You are older than 38 years of age. ? You are at high risk for heart disease.  Cancer screening Lung Cancer  Lung cancer screening is recommended for adults 55-80 years old who are at high risk for lung cancer because of a history of smoking.  A yearly low-dose CT scan of the lungs is recommended for people who: ? Currently smoke. ? Have quit within the past 15 years. ? Have at least a 30-pack-year history of smoking. A pack year is smoking an average of one pack of cigarettes a day for 1 year.  Yearly screening should continue until it has been 15 years since you quit.  Yearly screening should stop if you develop a health problem that would prevent you from having lung cancer treatment.  Breast Cancer  Practice breast self-awareness. This means understanding how your breasts normally appear and feel.  It also means doing regular breast self-exams. Let your health care provider know about any changes, no matter how small.  If you are in your 20s or 30s, you should have a clinical breast exam (CBE) by a health care provider every 1-3 years as part of a regular health exam.  If you are 40 or older, have a CBE every year. Also consider having a breast X-ray (mammogram) every year.  If you have a family history of breast cancer, talk to your health care provider about genetic screening.  If you are at high risk   for breast cancer, talk to your health care provider about having an MRI and a mammogram every year.  Breast cancer gene (BRCA) assessment is recommended for women who have family members with BRCA-related cancers. BRCA-related cancers include: ? Breast. ? Ovarian. ? Tubal. ? Peritoneal cancers.  Results of the assessment will determine the need for genetic counseling and BRCA1 and BRCA2 testing.  Cervical Cancer Your health care provider may recommend that you be screened regularly for cancer of the pelvic organs (ovaries, uterus, and  vagina). This screening involves a pelvic examination, including checking for microscopic changes to the surface of your cervix (Pap test). You may be encouraged to have this screening done every 3 years, beginning at age 22.  For women ages 56-65, health care providers may recommend pelvic exams and Pap testing every 3 years, or they may recommend the Pap and pelvic exam, combined with testing for human papilloma virus (HPV), every 5 years. Some types of HPV increase your risk of cervical cancer. Testing for HPV may also be done on women of any age with unclear Pap test results.  Other health care providers may not recommend any screening for nonpregnant women who are considered low risk for pelvic cancer and who do not have symptoms. Ask your health care provider if a screening pelvic exam is right for you.  If you have had past treatment for cervical cancer or a condition that could lead to cancer, you need Pap tests and screening for cancer for at least 20 years after your treatment. If Pap tests have been discontinued, your risk factors (such as having a new sexual partner) need to be reassessed to determine if screening should resume. Some women have medical problems that increase the chance of getting cervical cancer. In these cases, your health care provider may recommend more frequent screening and Pap tests.  Colorectal Cancer  This type of cancer can be detected and often prevented.  Routine colorectal cancer screening usually begins at 38 years of age and continues through 38 years of age.  Your health care provider may recommend screening at an earlier age if you have risk factors for colon cancer.  Your health care provider may also recommend using home test kits to check for hidden blood in the stool.  A small camera at the end of a tube can be used to examine your colon directly (sigmoidoscopy or colonoscopy). This is done to check for the earliest forms of colorectal  cancer.  Routine screening usually begins at age 33.  Direct examination of the colon should be repeated every 5-10 years through 38 years of age. However, you may need to be screened more often if early forms of precancerous polyps or small growths are found.  Skin Cancer  Check your skin from head to toe regularly.  Tell your health care provider about any new moles or changes in moles, especially if there is a change in a mole's shape or color.  Also tell your health care provider if you have a mole that is larger than the size of a pencil eraser.  Always use sunscreen. Apply sunscreen liberally and repeatedly throughout the day.  Protect yourself by wearing long sleeves, pants, a wide-brimmed hat, and sunglasses whenever you are outside.  Heart disease, diabetes, and high blood pressure  High blood pressure causes heart disease and increases the risk of stroke. High blood pressure is more likely to develop in: ? People who have blood pressure in the high end of  the normal range (130-139/85-89 mm Hg). ? People who are overweight or obese. ? People who are African American.  If you are 21-29 years of age, have your blood pressure checked every 3-5 years. If you are 3 years of age or older, have your blood pressure checked every year. You should have your blood pressure measured twice-once when you are at a hospital or clinic, and once when you are not at a hospital or clinic. Record the average of the two measurements. To check your blood pressure when you are not at a hospital or clinic, you can use: ? An automated blood pressure machine at a pharmacy. ? A home blood pressure monitor.  If you are between 17 years and 37 years old, ask your health care provider if you should take aspirin to prevent strokes.  Have regular diabetes screenings. This involves taking a blood sample to check your fasting blood sugar level. ? If you are at a normal weight and have a low risk for diabetes,  have this test once every three years after 38 years of age. ? If you are overweight and have a high risk for diabetes, consider being tested at a younger age or more often. Preventing infection Hepatitis B  If you have a higher risk for hepatitis B, you should be screened for this virus. You are considered at high risk for hepatitis B if: ? You were born in a country where hepatitis B is common. Ask your health care provider which countries are considered high risk. ? Your parents were born in a high-risk country, and you have not been immunized against hepatitis B (hepatitis B vaccine). ? You have HIV or AIDS. ? You use needles to inject street drugs. ? You live with someone who has hepatitis B. ? You have had sex with someone who has hepatitis B. ? You get hemodialysis treatment. ? You take certain medicines for conditions, including cancer, organ transplantation, and autoimmune conditions.  Hepatitis C  Blood testing is recommended for: ? Everyone born from [birth year] through 1967. ? Anyone with known risk factors for hepatitis C.  Sexually transmitted infections (STIs)  You should be screened for sexually transmitted infections (STIs) including gonorrhea and chlamydia if: ? You are sexually active and are younger than 38 years of age. ? You are older than 38 years of age and your health care provider tells you that you are at risk for this type of infection. ? Your sexual activity has changed since you were last screened and you are at an increased risk for chlamydia or gonorrhea. Ask your health care provider if you are at risk.  If you do not have HIV, but are at risk, it may be recommended that you take a prescription medicine daily to prevent HIV infection. This is called pre-exposure prophylaxis (PrEP). You are considered at risk if: ? You are sexually active and do not regularly use condoms or know the HIV status of your partner(s). ? You take drugs by injection. ? You are  sexually active with a partner who has HIV.  Talk with your health care provider about whether you are at high risk of being infected with HIV. If you choose to begin PrEP, you should first be tested for HIV. You should then be tested every 3 months for as long as you are taking PrEP. Pregnancy  If you are premenopausal and you may become pregnant, ask your health care provider about preconception counseling.  If you may become  pregnant, take 400 to 800 micrograms (mcg) of folic acid every day.  If you want to prevent pregnancy, talk to your health care provider about birth control (contraception). Osteoporosis and menopause  Osteoporosis is a disease in which the bones lose minerals and strength with aging. This can result in serious bone fractures. Your risk for osteoporosis can be identified using a bone density scan.  If you are 65 years of age or older, or if you are at risk for osteoporosis and fractures, ask your health care provider if you should be screened.  Ask your health care provider whether you should take a calcium or vitamin D supplement to lower your risk for osteoporosis.  Menopause may have certain physical symptoms and risks.  Hormone replacement therapy may reduce some of these symptoms and risks. Talk to your health care provider about whether hormone replacement therapy is right for you. Follow these instructions at home:  Schedule regular health, dental, and eye exams.  Stay current with your immunizations.  Do not use any tobacco products including cigarettes, chewing tobacco, or electronic cigarettes.  If you are pregnant, do not drink alcohol.  If you are breastfeeding, limit how much and how often you drink alcohol.  Limit alcohol intake to no more than 1 drink per day for nonpregnant women. One drink equals 12 ounces of beer, 5 ounces of wine, or 1 ounces of hard liquor.  Do not use street drugs.  Do not share needles.  Ask your health care  provider for help if you need support or information about quitting drugs.  Tell your health care provider if you often feel depressed.  Tell your health care provider if you have ever been abused or do not feel safe at home. This information is not intended to replace advice given to you by your health care provider. Make sure you discuss any questions you have with your health care provider. Document Released: 08/11/2010 Document Revised: 07/04/2015 Document Reviewed: 10/30/2014 Elsevier Interactive Patient Education  2018 [employer] Carbohydrate Counting for Diabetes Mellitus, Adult Carbohydrate counting is a method for keeping track of how many carbohydrates you eat. Eating carbohydrates naturally increases the amount of sugar (glucose) in the blood. Counting how many carbohydrates you eat helps keep your blood glucose within normal limits, which helps you manage your diabetes (diabetes mellitus). It is important to know how many carbohydrates you can safely have in each meal. This is different for every person. A diet and nutrition specialist (registered dietitian) can help you make a meal plan and calculate how many carbohydrates you should have at each meal and snack. Carbohydrates are found in the following foods:  Grains, such as breads and cereals.  Dried beans and soy products.  Starchy vegetables, such as potatoes, peas, and corn.  Fruit and fruit juices.  Milk and yogurt.  Sweets and snack foods, such as cake, cookies, candy, chips, and soft drinks.  How do I count carbohydrates? There are two ways to count carbohydrates in food. You can use either of the methods or a combination of both. Reading "Nutrition Facts" on packaged food The "Nutrition Facts" list is included on the labels of almost all packaged foods and beverages in the [place] It includes:  The serving size.  Information about nutrients in each serving, including the grams (g) of carbohydrate per  serving.  To use the "Nutrition Facts":  Decide how many servings you will have.  Multiply the number of servings by the number of carbohydrates   per serving.  The resulting number is the total amount of carbohydrates that you will be having.  Learning standard serving sizes of other foods When you eat foods containing carbohydrates that are not packaged or do not include "Nutrition Facts" on the label, you need to measure the servings in order to count the amount of carbohydrates:  Measure the foods that you will eat with a food scale or measuring cup, if needed.  Decide how many standard-size servings you will eat.  Multiply the number of servings by 15. Most carbohydrate-rich foods have about 15 g of carbohydrates per serving. ? For example, if you eat 8 oz (170 g) of strawberries, you will have eaten 2 servings and 30 g of carbohydrates (2 servings x 15 g = 30 g).  For foods that have more than one food mixed, such as soups and casseroles, you must count the carbohydrates in each food that is included.  The following list contains standard serving sizes of common carbohydrate-rich foods. Each of these servings has about 15 g of carbohydrates:   hamburger bun or  English muffin.   oz (15 mL) syrup.   oz (14 g) jelly.  1 slice of bread.  1 six-inch tortilla.  3 oz (85 g) cooked rice or pasta.  4 oz (113 g) cooked dried beans.  4 oz (113 g) starchy vegetable, such as peas, corn, or potatoes.  4 oz (113 g) hot cereal.  4 oz (113 g) mashed potatoes or  of a large baked potato.  4 oz (113 g) canned or frozen fruit.  4 oz (120 mL) fruit juice.  4-6 crackers.  6 chicken nuggets.  6 oz (170 g) unsweetened dry cereal.  6 oz (170 g) plain fat-free yogurt or yogurt sweetened with artificial sweeteners.  8 oz (240 mL) milk.  8 oz (170 g) fresh fruit or one small piece of fruit.  24 oz (680 g) popped popcorn.  Example of carbohydrate counting Sample meal  3  oz (85 g) chicken breast.  6 oz (170 g) brown rice.  4 oz (113 g) corn.  8 oz (240 mL) milk.  8 oz (170 g) strawberries with sugar-free whipped topping. Carbohydrate calculation 1. Identify the foods that contain carbohydrates: ? Rice. ? Corn. ? Milk. ? Strawberries. 2. Calculate how many servings you have of each food: ? 2 servings rice. ? 1 serving corn. ? 1 serving milk. ? 1 serving strawberries. 3. Multiply each number of servings by 15 g: ? 2 servings rice x 15 g = 30 g. ? 1 serving corn x 15 g = 15 g. ? 1 serving milk x 15 g = 15 g. ? 1 serving strawberries x 15 g = 15 g. 4. Add together all of the amounts to find the total grams of carbohydrates eaten: ? 30 g + 15 g + 15 g + 15 g = 75 g of carbohydrates total. This information is not intended to replace advice given to you by your health care provider. Make sure you discuss any questions you have with your health care provider. Document Released: 01/26/2005 Document Revised: 08/16/2015 Document Reviewed: 07/10/2015 Elsevier Interactive Patient Education  [employer].

## 2016-10-08 LAB — URINALYSIS W MICROSCOPIC + REFLEX CULTURE
Bacteria, UA: NONE SEEN [HPF]
Bilirubin Urine: NEGATIVE
Crystals: NONE SEEN [HPF]
Hgb urine dipstick: NEGATIVE
Ketones, ur: NEGATIVE
Leukocytes, UA: NEGATIVE
Nitrite: NEGATIVE
Specific Gravity, Urine: 1.019 (ref 1.001–1.035)
Yeast: NONE SEEN [HPF]
pH: 5.5 (ref 5.0–8.0)

## 2016-10-08 LAB — COMPREHENSIVE METABOLIC PANEL
ALT: 32 U/L — ABNORMAL HIGH (ref 6–29)
AST: 27 U/L (ref 10–30)
Albumin: 4.1 g/dL (ref 3.6–5.1)
Alkaline Phosphatase: 68 U/L (ref 33–115)
BUN: 9 mg/dL (ref 7–25)
CO2: 18 mmol/L — ABNORMAL LOW (ref 20–32)
Calcium: 9.1 mg/dL (ref 8.6–10.2)
Chloride: 101 mmol/L (ref 98–110)
Creat: 0.78 mg/dL (ref 0.50–1.10)
Glucose, Bld: 252 mg/dL — ABNORMAL HIGH (ref 65–99)
Potassium: 4.1 mmol/L (ref 3.5–5.3)
Sodium: 136 mmol/L (ref 135–146)
Total Bilirubin: 0.5 mg/dL (ref 0.2–1.2)
Total Protein: 7.1 g/dL (ref 6.1–8.1)

## 2016-10-08 LAB — LIPID PANEL
Cholesterol: 279 mg/dL — ABNORMAL HIGH (ref <200)
HDL: 52 mg/dL (ref >50)
LDL Cholesterol: 172 mg/dL — ABNORMAL HIGH (ref <100)
Total CHOL/HDL Ratio: 5.4 Ratio — ABNORMAL HIGH (ref <5.0)
Triglycerides: 274 mg/dL — ABNORMAL HIGH (ref <150)
VLDL: 55 mg/dL — ABNORMAL HIGH (ref <30)

## 2016-10-08 LAB — HEMOGLOBIN A1C
Hgb A1c MFr Bld: 9.6 % — ABNORMAL HIGH (ref <5.7)
Mean Plasma Glucose: 229 mg/dL

## 2016-10-10 LAB — URINE CULTURE

## 2016-10-13 ENCOUNTER — Other Ambulatory Visit: Payer: Self-pay | Admitting: Gynecology

## 2016-10-13 MED ORDER — SULFAMETHOXAZOLE-TRIMETHOPRIM 800-160 MG PO TABS: 1.0000 | ORAL_TABLET | Freq: Two times a day (BID) | ORAL | 0 refills | Status: DC

## 2017-01-22 ENCOUNTER — Other Ambulatory Visit: Payer: Self-pay | Admitting: Women's Health

## 2017-01-22 DIAGNOSIS — E1100 Type 2 diabetes mellitus with hyperosmolarity without nonketotic hyperglycemic-hyperosmolar coma (NKHHC): Secondary | ICD-10-CM

## 2017-03-15 ENCOUNTER — Ambulatory Visit (INDEPENDENT_AMBULATORY_CARE_PROVIDER_SITE_OTHER): Payer: Commercial Managed Care - PPO | Admitting: Women's Health

## 2017-03-15 ENCOUNTER — Encounter: Payer: Self-pay | Admitting: Women's Health

## 2017-03-15 VITALS — BP 146/84

## 2017-03-15 DIAGNOSIS — B3731 Acute candidiasis of vulva and vagina: Secondary | ICD-10-CM

## 2017-03-15 DIAGNOSIS — Z7189 Other specified counseling: Secondary | ICD-10-CM

## 2017-03-15 DIAGNOSIS — B373 Candidiasis of vulva and vagina: Secondary | ICD-10-CM | POA: Diagnosis not present

## 2017-03-15 LAB — WET PREP FOR TRICH, YEAST, CLUE

## 2017-03-15 MED ORDER — TERCONAZOLE 0.4 % VA CREA: 1.0000 | TOPICAL_CREAM | Freq: Every day | VAGINAL | 0 refills | Status: DC

## 2017-03-15 MED ORDER — METFORMIN HCL 500 MG PO TABS: 500.0000 mg | ORAL_TABLET | Freq: Two times a day (BID) | ORAL | 1 refills | Status: DC

## 2017-03-15 MED ORDER — FLUCONAZOLE 150 MG PO TABS
150.0000 mg | ORAL_TABLET | Freq: Once | ORAL | 1 refills | Status: AC
Start: 2017-03-15 — End: 2017-03-15

## 2017-03-15 NOTE — Progress Notes (Signed)
37 year old SBF G2 P2 presents with complaint of external vaginal itching and white discharge for the past few weeks, tried over-the-counter Monistat with some relief. Type 2 diabetes on metformin but stopped several weeks ago due to loose stools. 10/2014 Mirena IUD rare bleeding. Same partner. Denies urinary symptoms, abdominal pain or fever. Morbid obesity. 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.  Exam: Appears well. External genitalia erythematous at introitus, speculum exam scant white discharge vaginal walls with minimal erythema, wet prep positive for yeast. IUD strings visible.  Yeast vaginitis Type 2 diabetes  Plan: Diflucan 150 by mouth 1 dose and Terazol 7 apply externally at bedtime as needed. Prescriptions for both given. Reviewed importance of weight loss, low carb/sugar diet. Will start back on Glucophage 500 mg half tablet and gradually increase to twice a day. Reviewed importance of follow-up with primary care for diabetes management. Reviewed blood pressure elevated today, repeat at work follow-up as needed if greater than 130/80. Hemoglobin A1c pending.

## 2017-03-15 NOTE — Patient Instructions (Signed)

## 2017-03-16 LAB — HEMOGLOBIN A1C
Hgb A1c MFr Bld: 10.6 % of total Hgb — ABNORMAL HIGH (ref <5.7)
Mean Plasma Glucose: 258 (calc)
eAG (mmol/L): 14.3 (calc)

## 2017-04-06 ENCOUNTER — Telehealth: Payer: Self-pay

## 2017-04-06 DIAGNOSIS — Z7189 Other specified counseling: Secondary | ICD-10-CM

## 2017-04-06 MED ORDER — METFORMIN HCL 500 MG PO TABS: 500.0000 mg | ORAL_TABLET | Freq: Two times a day (BID) | ORAL | 1 refills | Status: DC

## 2017-04-06 NOTE — Telephone Encounter (Signed)
Refill sent.

## 2017-04-06 NOTE — Telephone Encounter (Signed)
Okay for refill?  

## 2017-04-06 NOTE — Telephone Encounter (Signed)
Received request from pharmacy to refill Metformin 500 mg tab with a 90 day supply of #180 tablets.

## 2017-06-07 ENCOUNTER — Encounter: Payer: Self-pay | Admitting: Gynecology

## 2017-06-07 ENCOUNTER — Ambulatory Visit: Payer: BLUE CROSS/BLUE SHIELD | Admitting: Gynecology

## 2017-06-07 VITALS — BP 118/76

## 2017-06-07 DIAGNOSIS — B373 Candidiasis of vulva and vagina: Secondary | ICD-10-CM | POA: Diagnosis not present

## 2017-06-07 DIAGNOSIS — B3731 Acute candidiasis of vulva and vagina: Secondary | ICD-10-CM

## 2017-06-07 MED ORDER — FLUCONAZOLE 150 MG PO TABS: 150.0000 mg | ORAL_TABLET | Freq: Every day | ORAL | 0 refills | Status: DC

## 2017-06-07 NOTE — Patient Instructions (Signed)
Take the Diflucan pill daily for 3 days and then take 1 of the Diflucan pills every week for 3 months to see if we cannot prevent recurrence of the yeast infections.

## 2017-06-07 NOTE — Progress Notes (Signed)
    Lynn Anderson 1981-01-17 [id number]        37 y.o.  [id number] presents with recurrent vaginal itching and discharge.  History of treatment for yeast and February.  Had a recurrence and was treated with Diflucan refill in March.  Now with 1 week or so of vaginal itching and discharge.  Does have a history of diabetes but reports that her glucose is in better control.  No vaginal odor.  No urinary symptoms such as frequency dysuria urgency low back pain fever or chills.  Past medical history,surgical history, problem list, medications, allergies, family history and social history were all reviewed and documented in the EPIC chart.  Directed ROS with pertinent positives and negatives documented in the history of present illness/assessment and plan.  Exam: Kennon Portela assistant Vitals:   06/07/17 1549  BP: 118/76   General appearance:  Normal Abdomen soft nontender without masses guarding rebound Pelvic external BUS vagina with thick white discharge.  Cervix grossly normal.  IUD string visualized.  Uterus grossly normal midline mobile nontender.  Adnexa without masses or tenderness.  Assessment/Plan:  37 y.o. [id number] with recurrent yeast infections.  Wet prep is positive for yeast.  Reviewed various strategies to include better glucose control.  Boric acid vaginal suppositories twice weekly versus Diflucan pills weekly over several months to suppress recurrences discussed.  We will go ahead and treat with Diflucan 150 mg x 3 days to eliminate yeast reserves and then Diflucan 150 mg weekly x3 months to see if we cannot prevent recurrences.  Patients can work on her glucose control.  Patient will follow-up if she has persistence or recurrence of her symptoms.    Dara Lords MD, 4:20 PM 06/07/2017

## 2017-06-07 NOTE — Addendum Note (Signed)
Addended by: Dayna Barker on: 06/07/2017 04:35 PM   Modules accepted: Orders

## 2017-06-08 DIAGNOSIS — B373 Candidiasis of vulva and vagina: Secondary | ICD-10-CM | POA: Diagnosis not present

## 2017-06-08 LAB — WET PREP FOR TRICH, YEAST, CLUE

## 2017-07-21 ENCOUNTER — Encounter (HOSPITAL_COMMUNITY): Payer: Self-pay

## 2017-07-21 ENCOUNTER — Emergency Department (HOSPITAL_COMMUNITY)
Admission: EM | Admit: 2017-07-21 | Discharge: 2017-07-21 | Disposition: A | Discharge status: Home / Self Care | Payer: BLUE CROSS/BLUE SHIELD | Attending: Emergency Medicine | Admitting: Emergency Medicine

## 2017-07-21 ENCOUNTER — Other Ambulatory Visit: Payer: Self-pay

## 2017-07-21 DIAGNOSIS — I10 Essential (primary) hypertension: Secondary | ICD-10-CM

## 2017-07-21 DIAGNOSIS — Z79899 Other long term (current) drug therapy: Secondary | ICD-10-CM | POA: Diagnosis not present

## 2017-07-21 DIAGNOSIS — Z7984 Long term (current) use of oral hypoglycemic drugs: Secondary | ICD-10-CM | POA: Diagnosis not present

## 2017-07-21 DIAGNOSIS — E119 Type 2 diabetes mellitus without complications: Secondary | ICD-10-CM | POA: Insufficient documentation

## 2017-07-21 DIAGNOSIS — R1013 Epigastric pain: Secondary | ICD-10-CM | POA: Diagnosis not present

## 2017-07-21 DIAGNOSIS — R109 Unspecified abdominal pain: Secondary | ICD-10-CM | POA: Diagnosis not present

## 2017-07-21 LAB — URINALYSIS, ROUTINE W REFLEX MICROSCOPIC
Bacteria, UA: NONE SEEN
Bilirubin Urine: NEGATIVE
Glucose, UA: >=500 mg/dL — AB
Hgb urine dipstick: NEGATIVE
Ketones, ur: 5 mg/dL — AB
Leukocytes, UA: NEGATIVE
Nitrite: NEGATIVE
Protein, ur: NEGATIVE mg/dL
Specific Gravity, Urine: 1.02 (ref 1.005–1.030)
pH: 5 (ref 5.0–8.0)

## 2017-07-21 LAB — CBC
HCT: 42.7 % (ref 36.0–46.0)
Hemoglobin: 13.8 g/dL (ref 12.0–15.0)
MCH: 25.3 pg — ABNORMAL LOW (ref 26.0–34.0)
MCHC: 32.3 g/dL (ref 30.0–36.0)
MCV: 78.3 fL (ref 78.0–100.0)
Platelets: 238 10*3/uL (ref 150–400)
RBC: 5.45 MIL/uL — ABNORMAL HIGH (ref 3.87–5.11)
RDW: 12.6 % (ref 11.5–15.5)
WBC: 8.4 10*3/uL (ref 4.0–10.5)

## 2017-07-21 LAB — COMPREHENSIVE METABOLIC PANEL
ALT: 40 U/L (ref 14–54)
AST: 42 U/L — ABNORMAL HIGH (ref 15–41)
Albumin: 3.4 g/dL — ABNORMAL LOW (ref 3.5–5.0)
Alkaline Phosphatase: 63 U/L (ref 38–126)
Anion gap: 10 (ref 5–15)
BUN: 7 mg/dL (ref 6–20)
CO2: 21 mmol/L — ABNORMAL LOW (ref 22–32)
Calcium: 8.2 mg/dL — ABNORMAL LOW (ref 8.9–10.3)
Chloride: 104 mmol/L (ref 101–111)
Creatinine, Ser: 0.72 mg/dL (ref 0.44–1.00)
GFR calc Af Amer: >60 mL/min (ref >60)
GFR calc non Af Amer: >60 mL/min (ref >60)
Glucose, Bld: 278 mg/dL — ABNORMAL HIGH (ref 65–99)
Potassium: 3.9 mmol/L (ref 3.5–5.1)
Sodium: 135 mmol/L (ref 135–145)
Total Bilirubin: 0.7 mg/dL (ref 0.3–1.2)
Total Protein: 6.7 g/dL (ref 6.5–8.1)

## 2017-07-21 LAB — I-STAT BETA HCG BLOOD, ED (MC, WL, AP ONLY): I-stat hCG, quantitative: <5 m[IU]/mL (ref <5)

## 2017-07-21 LAB — LIPASE, BLOOD: Lipase: 22 U/L (ref 11–51)

## 2017-07-21 MED ORDER — FAMOTIDINE 20 MG PO TABS
20.0000 mg | ORAL_TABLET | Freq: Two times a day (BID) | ORAL | 0 refills | Status: DC
Start: 2017-07-21 — End: 2017-10-13

## 2017-07-21 MED ORDER — SUCRALFATE 1 G PO TABS: 1.0000 g | ORAL_TABLET | Freq: Three times a day (TID) | ORAL | 1 refills | Status: DC

## 2017-07-21 MED ORDER — METOCLOPRAMIDE HCL 10 MG PO TABS: 10.0000 mg | ORAL_TABLET | Freq: Four times a day (QID) | ORAL | 0 refills | Status: DC | PRN

## 2017-07-21 MED ORDER — OMEPRAZOLE 20 MG PO CPDR: 20.0000 mg | DELAYED_RELEASE_CAPSULE | Freq: Every day | ORAL | 0 refills | Status: DC

## 2017-07-21 NOTE — ED Triage Notes (Signed)
Pt reports some generalized abdominal pain since Monday. Pt states the pain radiates around her left side into her back. Pt reports some nausea, no vomiting. Denies urinary symptoms.

## 2017-07-21 NOTE — ED Provider Notes (Addendum)
[facility] EMERGENCY DEPARTMENT Provider Note   CSN: [id number] Arrival date & time: 07/21/17  1155     History   Chief Complaint Chief Complaint  Patient presents with  . Abdominal Pain    HPI Lynn Anderson is a 37 y.o. female.  Patient with history of cholecystectomy presents the emergency department with complaint of epigastric pain that is been intermitten for the past 2 days.  Pain has been severe at times.  Patient has had associated nausea but no vomiting.  Pain radiates to her back and sometimes she will have shooting pains into her left side.  She has had decreased appetite.  Eating and drinking has not made the symptoms better or worse.  Patient does take ibuprofen approximately every other day for headaches and other pain.  She denies any extreme constipation or diarrhea.  No urinary symptoms.  Patient states that she feels a more constant sensation of fullness in her stomach.  No known sick contacts.  She denies alcohol use.  No chest pains or shortness of breath.  No cough or fever.  No treatments prior to arrival.     Past Medical History:  Diagnosis Date  . Diabetes mellitus without complication (HCC)   . Hyperlipidemia   . Hypertension     Patient Active Problem List   Diagnosis Date Noted  . Type 2 diabetes mellitus with hyperglycemia (HCC) 11/05/2014  . IUD (intrauterine device) in place 10/16/2014    Past Surgical History:  Procedure Laterality Date  . CHOLECYSTECTOMY    . Mirena      Inserted 10-16-14     OB History    Gravida  2   Para      Term      Preterm      AB  1   Living  2     SAB      TAB      Ectopic      Multiple      Live Births               Home Medications    Prior to Admission medications   Medication Sig Start Date End Date Taking? Authorizing Provider  famotidine (PEPCID) 20 MG tablet Take 1 tablet (20 mg total) by mouth 2 (two) times daily. 07/21/17   Renne Crigler, PA-C    fluconazole (DIFLUCAN) 150 MG tablet Take 1 tablet (150 mg total) by mouth daily. For 3 days then one tablet weekly for 3 month 06/07/17   Fontaine, Nadyne Coombes, MD  glucose blood (ONETOUCH VERIO) test strip Use to test blood sugar 2 times daily as instructed 11/05/14   Carlus Pavlov, MD  ibuprofen (ADVIL,MOTRIN) 200 MG tablet Take 200 mg by mouth every 6 (six) hours as needed.    [provider]  levonorgestrel (MIRENA) 20 MCG/24HR IUD 1 each by Intrauterine route once.    [provider]  metFORMIN (GLUCOPHAGE) 500 MG tablet Take 1 tablet (500 mg total) by mouth 2 (two) times daily with a meal. 04/06/17   Harrington Challenger, NP  metFORMIN (GLUCOPHAGE-XR) 500 MG 24 hr tablet TAKE 1 TABLET BY MOUTH EVERY DAY WITH BREAKFAST 01/22/17   Harrington Challenger, NP  metoCLOPramide (REGLAN) 10 MG tablet Take 1 tablet (10 mg total) by mouth every 6 (six) hours as needed for nausea or vomiting. 07/21/17   Renne Crigler, PA-C  omeprazole (PRILOSEC) 20 MG capsule Take 1 capsule (20 mg total) by mouth daily. 07/21/17  Renne CriglerGeiple, Mervin Ramires, PA-C  ONETOUCH DELICA LANCETS FINE MISC Use to test blood sugar 2 times daily as instructed. 11/05/14   Carlus PavlovGherghe, Cristina, MD  sucralfate (CARAFATE) 1 g tablet Take 1 tablet (1 g total) by mouth 4 (four) times daily -  with meals and at bedtime. 07/21/17   Renne CriglerGeiple, Ezell Poke, PA-C    Family History Family History  Problem Relation Age of Onset  . Hyperlipidemia Mother   . Hypertension Mother   . Diabetes Mother   . Diabetes Maternal Grandmother   . Heart disease Father   . Diabetes Maternal Aunt     Social History Social History   Tobacco Use  . Smoking status: Never Smoker  . Smokeless tobacco: Never Used  Substance Use Topics  . Alcohol use: No    Alcohol/week: 0.0 oz  . Drug use: No     Allergies   Patient has no known allergies.   Review of Systems Review of Systems  Constitutional: Negative for fever.  HENT: Negative for rhinorrhea and sore throat.    Eyes: Negative for redness.  Respiratory: Negative for cough.   Cardiovascular: Negative for chest pain.  Gastrointestinal: Positive for abdominal pain and nausea. Negative for diarrhea and vomiting.  Genitourinary: Negative for dysuria.  Musculoskeletal: Positive for back pain. Negative for myalgias.  Skin: Negative for rash.  Neurological: Negative for headaches.     Physical Exam Updated Vital Signs BP (!) 174/114 (BP Location: Right Arm)   Pulse 99   Temp 97.8 F (36.6 C) (Oral)   Resp 20   SpO2 100%   Physical Exam  Constitutional: She appears well-developed and well-nourished.  HENT:  Head: Normocephalic and atraumatic.  Mouth/Throat: Oropharynx is clear and moist.  Eyes: Conjunctivae are normal. Right eye exhibits no discharge. Left eye exhibits no discharge.  Neck: Normal range of motion. Neck supple.  Cardiovascular: Normal rate, regular rhythm and normal heart sounds.  No murmur heard. Pulmonary/Chest: Effort normal and breath sounds normal. She has no wheezes. She has no rhonchi. She has no rales.  Abdominal: Soft. Bowel sounds are normal. There is tenderness (Mild, epigastric) in the epigastric area. There is no rigidity, no rebound, no guarding, no tenderness at McBurney's point and negative Murphy's sign.  Neurological: She is alert.  Skin: Skin is warm and dry.  Psychiatric: She has a normal mood and affect.  Nursing note and vitals reviewed.    ED Treatments / Results  Labs (all labs ordered are listed, but only abnormal results are displayed) Labs Reviewed  COMPREHENSIVE METABOLIC PANEL - Abnormal; Notable for the following components:      Result Value   CO2 21 (*)    Glucose, Bld 278 (*)    Calcium 8.2 (*)    Albumin 3.4 (*)    AST 42 (*)    All other components within normal limits  CBC - Abnormal; Notable for the following components:   RBC 5.45 (*)    MCH 25.3 (*)    All other components within normal limits  URINALYSIS, ROUTINE W REFLEX  MICROSCOPIC - Abnormal; Notable for the following components:   Glucose, UA >=500 (*)    Ketones, ur 5 (*)    All other components within normal limits  LIPASE, BLOOD  I-STAT BETA HCG BLOOD, ED ([facility], [facility], [facility] ONLY)    EKG None  Radiology No results found.  Procedures Procedures (including critical care time)  Medications Ordered in ED Medications - No data to display   Initial Impression /  Assessment and Plan / ED Course  I have reviewed the triage vital signs and the nursing notes.  Pertinent labs & imaging results that were available during my care of the patient were reviewed by me and considered in my medical decision making (see chart for details).     Patient seen and examined.  Patient with minimal epigastric pain at time of exam.  Lab work is reassuring.  Patient is not pregnant.  Vital signs reviewed and are as follows: BP (!) 174/114 (BP Location: Right Arm)   Pulse 99   Temp 97.8 F (36.6 C) (Oral)   Resp 20   SpO2 100%   We discussed her symptoms and her lab testing today.  I offered symptom management and monitoring versus CT scan.  I discussed that I have low suspicion that imaging will reveal source of symptoms.  Patient agrees to defer this at this time.  We will discharged home with Reglan for nausea, Carafate, omeprazole and Pepcid.  Patient encouraged to follow-up with primary care in 1 week for recheck and for blood pressure recheck.  The patient was urged to return to the Emergency Department immediately with worsening of current symptoms, worsening abdominal pain, persistent vomiting, blood noted in stools, fever, or any other concerns. The patient verbalized understanding.    Final Clinical Impressions(s) / ED Diagnoses   Final diagnoses:  Epigastric pain  Hypertension, unspecified type   Patient with abdominal pain, mainly epigastric sometimes with radiation to the back and left lateral abdomen. Vitals are stable, no fever. Labs are reassuring,  shows hyperglycemia without ketosis. Imaging not felt indicated at this time. No signs of dehydration, patient is tolerating PO's. Lungs are clear and no signs suggestive of PNA.  Most likely diagnosis is gastritis versus peptic ulcer disease, gastroparesis.  Low concern for appendicitis, pancreatitis, ruptured viscus, UTI, kidney stone, aortic dissection, aortic aneurysm or other emergent abdominal etiology. Supportive therapy indicated with return if symptoms worsen.    ED Discharge Orders        Ordered    metoCLOPramide (REGLAN) 10 MG tablet  Every 6 hours PRN     07/21/17 1705    sucralfate (CARAFATE) 1 g tablet  3 times daily with meals & bedtime     07/21/17 1705    famotidine (PEPCID) 20 MG tablet  2 times daily     07/21/17 1705    omeprazole (PRILOSEC) 20 MG capsule  Daily     07/21/17 1705       Renne Crigler, PA-C 07/21/17 1712    Renne Crigler, PA-C 07/21/17 1713    Wynetta Fines, MD 07/21/17 2337

## 2017-07-21 NOTE — Discharge Instructions (Signed)
Please read and follow all provided instructions.  Your diagnoses today include:  1. Epigastric pain     Tests performed today include:  Blood counts and electrolytes  Blood tests to check liver and kidney function  Blood tests to check pancreas function  Urine test to look for infection and pregnancy (in women)  Vital signs. See below for your results today.   Medications prescribed:   Reglan - for nausea and vomiting   Carafate - for stomach upset and to protect your stomach   Omeprazole (Prilosec) - stomach acid reducer  This medication can be found over-the-counter   Pepcid (famotidine) - antihistamine  You can find this medication over-the-counter.   DO NOT exceed:   20mg  Pepcid every 12 hours  Take any prescribed medications only as directed.  Home care instructions:   Follow any educational materials contained in this packet.  Avoid ibuprofen, alcohol.  Follow-up instructions: Please follow-up with your primary care provider in the next 7 days for further evaluation of your symptoms.    Return instructions:  SEEK IMMEDIATE MEDICAL ATTENTION IF:  The pain does not go away or becomes severe   A temperature above 101F develops   Repeated vomiting occurs (multiple episodes)   The pain becomes localized to portions of the abdomen. The right side could possibly be appendicitis. In an adult, the left lower portion of the abdomen could be colitis or diverticulitis.   Blood is being passed in stools or vomit (bright red or black tarry stools)   You develop chest pain, difficulty breathing, dizziness or fainting, or become confused, poorly responsive, or inconsolable (young children)  If you have any other emergent concerns regarding your health  Additional Information: Abdominal (belly) pain can be caused by many things. Your caregiver performed an examination and possibly ordered blood/urine tests and imaging (CT scan, x-rays, ultrasound). Many cases  can be observed and treated at home after initial evaluation in the emergency department. Even though you are being discharged home, abdominal pain can be unpredictable. Therefore, you need a repeated exam if your pain does not resolve, returns, or worsens. Most patients with abdominal pain don't have to be admitted to the hospital or have surgery, but serious problems like appendicitis and gallbladder attacks can start out as nonspecific pain. Many abdominal conditions cannot be diagnosed in one visit, so follow-up evaluations are very important.  Your vital signs today were: BP (!) 174/114 (BP Location: Right Arm)    Pulse 99    Temp 97.8 F (36.6 C) (Oral)    Resp 20    SpO2 100%  If your blood pressure (bp) was elevated above 135/85 this visit, please have this repeated by your doctor within one month. --------------

## 2017-07-21 NOTE — ED Notes (Signed)
Pt stable, ambulatory, and verbalizes understanding of d/c instructions.  

## 2017-08-17 ENCOUNTER — Inpatient Hospital Stay: Payer: BLUE CROSS/BLUE SHIELD

## 2017-08-20 ENCOUNTER — Ambulatory Visit: Payer: BLUE CROSS/BLUE SHIELD | Attending: Family Medicine | Admitting: Family Medicine

## 2017-08-20 ENCOUNTER — Encounter: Payer: Self-pay | Admitting: Family Medicine

## 2017-08-20 ENCOUNTER — Other Ambulatory Visit: Payer: Self-pay

## 2017-08-20 VITALS — BP 161/123 | HR 88 | Temp 99.0°F | Resp 12 | Ht 67.0 in | Wt 288.0 lb

## 2017-08-20 DIAGNOSIS — I10 Essential (primary) hypertension: Secondary | ICD-10-CM | POA: Insufficient documentation

## 2017-08-20 DIAGNOSIS — Z9049 Acquired absence of other specified parts of digestive tract: Secondary | ICD-10-CM | POA: Diagnosis not present

## 2017-08-20 DIAGNOSIS — E785 Hyperlipidemia, unspecified: Secondary | ICD-10-CM | POA: Insufficient documentation

## 2017-08-20 DIAGNOSIS — Z7984 Long term (current) use of oral hypoglycemic drugs: Secondary | ICD-10-CM | POA: Diagnosis not present

## 2017-08-20 DIAGNOSIS — Z7689 Persons encountering health services in other specified circumstances: Secondary | ICD-10-CM | POA: Diagnosis not present

## 2017-08-20 DIAGNOSIS — Z79899 Other long term (current) drug therapy: Secondary | ICD-10-CM | POA: Diagnosis not present

## 2017-08-20 DIAGNOSIS — E7800 Pure hypercholesterolemia, unspecified: Secondary | ICD-10-CM | POA: Insufficient documentation

## 2017-08-20 DIAGNOSIS — R1013 Epigastric pain: Secondary | ICD-10-CM | POA: Insufficient documentation

## 2017-08-20 DIAGNOSIS — E1165 Type 2 diabetes mellitus with hyperglycemia: Secondary | ICD-10-CM | POA: Diagnosis not present

## 2017-08-20 LAB — POCT CBG (FASTING - GLUCOSE)-MANUAL ENTRY: Glucose Fasting, POC: 244 mg/dL — AB (ref 70–99)

## 2017-08-20 MED ORDER — ATORVASTATIN CALCIUM 20 MG PO TABS: 20.0000 mg | ORAL_TABLET | Freq: Every day | ORAL | 3 refills | Status: DC

## 2017-08-20 MED ORDER — GLIPIZIDE 10 MG PO TABS: 10.0000 mg | ORAL_TABLET | Freq: Two times a day (BID) | ORAL | 3 refills | Status: DC

## 2017-08-20 MED ORDER — ONETOUCH ULTRA 2 W/DEVICE KIT
1.0000 | PACK | Freq: Three times a day (TID) | 0 refills | Status: AC
Start: 2017-08-20 — End: ?

## 2017-08-20 MED ORDER — LISINOPRIL 5 MG PO TABS: 5.0000 mg | ORAL_TABLET | Freq: Every day | ORAL | 3 refills | Status: DC

## 2017-08-20 MED ORDER — ONETOUCH ULTRASOFT LANCETS MISC: 12 refills | Status: AC

## 2017-08-20 NOTE — Progress Notes (Signed)
Subjective:  Patient ID: Lynn Anderson, female    DOB: 07-08-1980  Age: 37 y.o. MRN: [id number]  CC: Hospitalization Follow-up   HPI Lynn Anderson is a 37 year old female with history of type 2 diabetes mellitus (A1c 10.6), hypertension, hyperlipidemia without a PCP who was receiving metformin from her GYN.  She presents today to establish care after an ED visit for epigastric pain on 07/21/2017 for which she received PPI and reports resolution of symptoms. She complains of diarrhea and abdominal cramping with metformin but has been taking it; she has not been checking her sugars due to lack of her glucometer.  Her blood pressure is elevated and she is currently not on any antihypertensives and review of her last labs reveal elevated lipids  and she is not on a statin.  Denies regular involvement in exercise, compliance with a diabetic diet or low-sodium, low-cholesterol diet. She denies hypoglycemia, numbness in extremities, visual concerns. She has no additional concerns today.  Past Medical History:  Diagnosis Date  . Diabetes mellitus without complication ([place])   . Hyperlipidemia   . Hypertension     Past Surgical History:  Procedure Laterality Date  . CHOLECYSTECTOMY    . Mirena      Inserted 10-16-14    No Known Allergies   Outpatient Medications Prior to Visit  Medication Sig Dispense Refill  . famotidine (PEPCID) 20 MG tablet Take 1 tablet (20 mg total) by mouth 2 (two) times daily. 30 tablet 0  . fluconazole (DIFLUCAN) 150 MG tablet Take 1 tablet (150 mg total) by mouth daily. For 3 days then one tablet weekly for 3 month 15 tablet 0  . ibuprofen (ADVIL,MOTRIN) 200 MG tablet Take 200 mg by mouth every 6 (six) hours as needed.    Marland Kitchen levonorgestrel (MIRENA) 20 MCG/24HR IUD 1 each by Intrauterine route once.    . metFORMIN (GLUCOPHAGE-XR) 500 MG 24 hr tablet TAKE 1 TABLET BY MOUTH EVERY DAY WITH BREAKFAST 90 tablet 3  . omeprazole (PRILOSEC) 20 MG capsule Take 1  capsule (20 mg total) by mouth daily. 30 capsule 0  . sucralfate (CARAFATE) 1 g tablet Take 1 tablet (1 g total) by mouth 4 (four) times daily -  with meals and at bedtime. 60 tablet 1  . glucose blood (ONETOUCH VERIO) test strip Use to test blood sugar 2 times daily as instructed 100 each 2  . metFORMIN (GLUCOPHAGE) 500 MG tablet Take 1 tablet (500 mg total) by mouth 2 (two) times daily with a meal. (Patient not taking: Reported on 08/20/2017) 180 tablet 1  . metoCLOPramide (REGLAN) 10 MG tablet Take 1 tablet (10 mg total) by mouth every 6 (six) hours as needed for nausea or vomiting. (Patient not taking: Reported on 08/20/2017) 10 tablet 0  . ONETOUCH DELICA LANCETS FINE MISC Use to test blood sugar 2 times daily as instructed. 100 each 2   No facility-administered medications prior to visit.     ROS Review of Systems  Constitutional: Negative for activity change, appetite change and fatigue.  HENT: Negative for congestion, sinus pressure and sore throat.   Eyes: Negative for visual disturbance.  Respiratory: Negative for cough, chest tightness, shortness of breath and wheezing.   Cardiovascular: Negative for chest pain and palpitations.  Gastrointestinal: Negative for abdominal distention, abdominal pain and constipation.  Endocrine: Negative for polydipsia.  Genitourinary: Negative for dysuria and frequency.  Musculoskeletal: Negative for arthralgias and back pain.  Skin: Negative for rash.  Neurological: Negative for tremors,  light-headedness and numbness.  Hematological: Does not bruise/bleed easily.  Psychiatric/Behavioral: Negative for agitation and behavioral problems.    Objective:  BP (!) 161/123   Pulse 88   Temp 99 F (37.2 C) (Oral)   Resp 12   Ht '5\' 7"'  (1.702 m)   Wt 288 lb (130.6 kg)   SpO2 99%   BMI 45.11 kg/m   BP/Weight 08/20/2017 07/21/2017 6/72/0947  Systolic BP 096 283 662  Diastolic BP 947 94 76  Wt. (Lbs) 288 - -  BMI 45.11 - -     Physical Exam    Constitutional: She is oriented to person, place, and time. She appears well-developed and well-nourished.  Cardiovascular: Normal rate, normal heart sounds and intact distal pulses.  No murmur heard. Pulmonary/Chest: Effort normal and breath sounds normal. She has no wheezes. She has no rales. She exhibits no tenderness.  Abdominal: Soft. Bowel sounds are normal. She exhibits no distension and no mass. There is no tenderness.  Musculoskeletal: Normal range of motion.  Neurological: She is alert and oriented to person, place, and time.  Skin: Skin is warm and dry.  Psychiatric: She has a normal mood and affect.     CMP Latest Ref Rng & Units 07/21/2017 10/07/2016 09/28/2014  Glucose 65 - 99 mg/dL 278(H) 252(H) 247(H)  BUN 6 - 20 mg/dL '7 9 8  ' Creatinine 0.44 - 1.00 mg/dL 0.72 0.78 0.67  Sodium 135 - 145 mmol/L 135 136 135  Potassium 3.5 - 5.1 mmol/L 3.9 4.1 4.4  Chloride 101 - 111 mmol/L 104 101 101  CO2 22 - 32 mmol/L 21(L) 18(L) 21  Calcium 8.9 - 10.3 mg/dL 8.2(L) 9.1 9.0  Total Protein 6.5 - 8.1 g/dL 6.7 7.1 6.9  Total Bilirubin 0.3 - 1.2 mg/dL 0.7 0.5 0.4  Alkaline Phos 38 - 126 U/L 63 68 68  AST 15 - 41 U/L 42(H) 27 58(H)  ALT 14 - 54 U/L 40 32(H) 60(H)    Lipid Panel     Component Value Date/Time   CHOL 279 (H) 10/07/2016 1131   TRIG 274 (H) 10/07/2016 1131   HDL 52 10/07/2016 1131   CHOLHDL 5.4 (H) 10/07/2016 1131   VLDL 55 (H) 10/07/2016 1131   LDLCALC 172 (H) 10/07/2016 1131    Lab Results  Component Value Date   HGBA1C 10.6 (H) 03/15/2017    Assessment & Plan:   1. Type 2 diabetes mellitus with hyperglycemia, without long-term current use of insulin (HCC) Uncontrolled with A1c of 10.6 Discontinued metformin due to GI intolerance Commenced glipizide Counseled on Diabetic diet, my plate method, 654 minutes of moderate intensity exercise/week Keep blood sugar logs with fasting goals of 80-120 mg/dl, random of less than 180 and in the event of sugars less than  60 mg/dl or greater than 400 mg/dl please notify the clinic ASAP. It is recommended that you undergo annual eye exams and annual foot exams. Pneumonia vaccine is recommended. - Glucose (CBG), Fasting - glipiZIDE (GLUCOTROL) 10 MG tablet; Take 1 tablet (10 mg total) by mouth 2 (two) times daily before a meal.  Dispense: 60 tablet; Refill: 3 - Lancets (ONETOUCH ULTRASOFT) lancets; Use as instructed  Dispense: 100 each; Refill: 12 - Blood Glucose Monitoring Suppl (ONE TOUCH ULTRA 2) w/Device KIT; 1 each by Does not apply route 3 (three) times daily.  Dispense: 1 each; Refill: 0  2. Pure hypercholesterolemia Uncontrolled Commenced Lipitor Low-cholesterol diet - atorvastatin (LIPITOR) 20 MG tablet; Take 1 tablet (20 mg total) by mouth  daily.  Dispense: 30 tablet; Refill: 3  3. Essential hypertension Uncontrolled Commenced lisinopril Counseled on blood pressure goal of less than 130/80, low-sodium, DASH diet, medication compliance, 150 minutes of moderate intensity exercise per week. Discussed medication compliance, adverse effects. - lisinopril (PRINIVIL,ZESTRIL) 5 MG tablet; Take 1 tablet (5 mg total) by mouth daily.  Dispense: 30 tablet; Refill: 3   Meds ordered this encounter  Medications  . glipiZIDE (GLUCOTROL) 10 MG tablet    Sig: Take 1 tablet (10 mg total) by mouth 2 (two) times daily before a meal.    Dispense:  60 tablet    Refill:  3    Discontinue metformin  . lisinopril (PRINIVIL,ZESTRIL) 5 MG tablet    Sig: Take 1 tablet (5 mg total) by mouth daily.    Dispense:  30 tablet    Refill:  3  . Lancets (ONETOUCH ULTRASOFT) lancets    Sig: Use as instructed    Dispense:  100 each    Refill:  12  . atorvastatin (LIPITOR) 20 MG tablet    Sig: Take 1 tablet (20 mg total) by mouth daily.    Dispense:  30 tablet    Refill:  3  . Blood Glucose Monitoring Suppl (ONE TOUCH ULTRA 2) w/Device KIT    Sig: 1 each by Does not apply route 3 (three) times daily.    Dispense:  1 each     Refill:  0    Please dispense with test strips; may substitute as per insurance requirements.    Follow-up: Return in about 3 months (around 11/20/2017) for Follow-up of chronic medical conditions.   Charlott Rakes MD

## 2017-08-20 NOTE — Progress Notes (Signed)
Hospital follow up: DM

## 2017-08-20 NOTE — Patient Instructions (Signed)
Diabetes Mellitus and Nutrition When you have diabetes (diabetes mellitus), it is very important to have healthy eating habits because your blood sugar (glucose) levels are greatly affected by what you eat and drink. Eating healthy foods in the appropriate amounts, at about the same times every day, can help you:  Control your blood glucose.  Lower your risk of heart disease.  Improve your blood pressure.  Reach or maintain a healthy weight.  Every person with diabetes is different, and each person has different needs for a meal plan. Your health care provider may recommend that you work with a diet and nutrition specialist (dietitian) to make a meal plan that is best for you. Your meal plan may vary depending on factors such as:  The calories you need.  The medicines you take.  Your weight.  Your blood glucose, blood pressure, and cholesterol levels.  Your activity level.  Other health conditions you have, such as heart or kidney disease.  How do carbohydrates affect me? Carbohydrates affect your blood glucose level more than any other type of food. Eating carbohydrates naturally increases the amount of glucose in your blood. Carbohydrate counting is a method for keeping track of how many carbohydrates you eat. Counting carbohydrates is important to keep your blood glucose at a healthy level, especially if you use insulin or take certain oral diabetes medicines. It is important to know how many carbohydrates you can safely have in each meal. This is different for every person. Your dietitian can help you calculate how many carbohydrates you should have at each meal and for snack. Foods that contain carbohydrates include:  Bread, cereal, rice, pasta, and crackers.  Potatoes and corn.  Peas, beans, and lentils.  Milk and yogurt.  Fruit and juice.  Desserts, such as cakes, cookies, ice cream, and candy.  How does alcohol affect me? Alcohol can cause a sudden decrease in blood  glucose (hypoglycemia), especially if you use insulin or take certain oral diabetes medicines. Hypoglycemia can be a life-threatening condition. Symptoms of hypoglycemia (sleepiness, dizziness, and confusion) are similar to symptoms of having too much alcohol. If your health care provider says that alcohol is safe for you, follow these guidelines:  Limit alcohol intake to no more than 1 drink per day for nonpregnant women and 2 drinks per day for men. One drink equals 12 oz of beer, 5 oz of wine, or 1 oz of hard liquor.  Do not drink on an empty stomach.  Keep yourself hydrated with water, diet soda, or unsweetened iced tea.  Keep in mind that regular soda, juice, and other mixers may contain a lot of sugar and must be counted as carbohydrates.  What are tips for following this plan? Reading food labels  Start by checking the serving size on the label. The amount of calories, carbohydrates, fats, and other nutrients listed on the label are based on one serving of the food. Many foods contain more than one serving per package.  Check the total grams (g) of carbohydrates in one serving. You can calculate the number of servings of carbohydrates in one serving by dividing the total carbohydrates by 15. For example, if a food has 30 g of total carbohydrates, it would be equal to 2 servings of carbohydrates.  Check the number of grams (g) of saturated and trans fats in one serving. Choose foods that have low or no amount of these fats.  Check the number of milligrams (mg) of sodium in one serving. Most people   should limit total sodium intake to less than 2,300 mg per day.  Always check the nutrition information of foods labeled as "low-fat" or "nonfat". These foods may be higher in added sugar or refined carbohydrates and should be avoided.  Talk to your dietitian to identify your daily goals for nutrients listed on the label. Shopping  Avoid buying canned, premade, or processed foods. These  foods tend to be high in fat, sodium, and added sugar.  Shop around the outside edge of the grocery store. This includes fresh fruits and vegetables, bulk grains, fresh meats, and fresh dairy. Cooking  Use low-heat cooking methods, such as baking, instead of high-heat cooking methods like deep frying.  Cook using healthy oils, such as olive, canola, or sunflower oil.  Avoid cooking with butter, cream, or high-fat meats. Meal planning  Eat meals and snacks regularly, preferably at the same times every day. Avoid going long periods of time without eating.  Eat foods high in fiber, such as fresh fruits, vegetables, beans, and whole grains. Talk to your dietitian about how many servings of carbohydrates you can eat at each meal.  Eat 4-6 ounces of lean protein each day, such as lean meat, chicken, fish, eggs, or tofu. 1 ounce is equal to 1 ounce of meat, chicken, or fish, 1 egg, or 1/4 cup of tofu.  Eat some foods each day that contain healthy fats, such as avocado, nuts, seeds, and fish. Lifestyle   Check your blood glucose regularly.  Exercise at least 30 minutes 5 or more days each week, or as told by your health care provider.  Take medicines as told by your health care provider.  Do not use any products that contain nicotine or tobacco, such as cigarettes and e-cigarettes. If you need help quitting, ask your health care provider.  Work with a counselor or diabetes educator to identify strategies to manage stress and any emotional and social challenges. What are some questions to ask my health care provider?  Do I need to meet with a diabetes educator?  Do I need to meet with a dietitian?  What number can I call if I have questions?  When are the best times to check my blood glucose? Where to find more information:  American Diabetes Association: diabetes.org/food-and-fitness/food  Academy of Nutrition and Dietetics:  www.eatright.org/resources/health/diseases-and-conditions/diabetes  National Institute of Diabetes and Digestive and Kidney Diseases (NIH): www.niddk.nih.gov/health-information/diabetes/overview/diet-eating-physical-activity Summary  A healthy meal plan will help you control your blood glucose and maintain a healthy lifestyle.  Working with a diet and nutrition specialist (dietitian) can help you make a meal plan that is best for you.  Keep in mind that carbohydrates and alcohol have immediate effects on your blood glucose levels. It is important to count carbohydrates and to use alcohol carefully. This information is not intended to replace advice given to you by your health care provider. Make sure you discuss any questions you have with your health care provider. Document Released: 10/23/2004 Document Revised: 03/02/2016 Document Reviewed: 03/02/2016 Elsevier Interactive Patient Education  2018 [employer]  

## 2017-10-13 ENCOUNTER — Encounter: Payer: Self-pay | Admitting: Women's Health

## 2017-10-13 ENCOUNTER — Ambulatory Visit: Payer: BLUE CROSS/BLUE SHIELD | Admitting: Women's Health

## 2017-10-13 VITALS — BP 142/82 | Ht 67.0 in | Wt 284.0 lb

## 2017-10-13 DIAGNOSIS — B373 Candidiasis of vulva and vagina: Secondary | ICD-10-CM | POA: Diagnosis not present

## 2017-10-13 DIAGNOSIS — B3731 Acute candidiasis of vulva and vagina: Secondary | ICD-10-CM

## 2017-10-13 DIAGNOSIS — Z01419 Encounter for gynecological examination (general) (routine) without abnormal findings: Secondary | ICD-10-CM | POA: Diagnosis not present

## 2017-10-13 LAB — WET PREP FOR TRICH, YEAST, CLUE

## 2017-10-13 MED ORDER — FLUCONAZOLE 150 MG PO TABS: 150.0000 mg | ORAL_TABLET | Freq: Every day | ORAL | 0 refills | Status: DC

## 2017-10-13 NOTE — Progress Notes (Addendum)
Lynn Anderson 39-Aug-1982 [id number]    History:    Presents for annual exam.  10/2014 Mirena IUD with amenorrhea.  Primary care manages hypertension, diabetes and hypercholesteremia.  Normal Pap history.  Same partner.  Past medical history, past surgical history, family history and social history were all reviewed and documented in the EPIC chart.  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  Mother hypertension diabetes, father heart disease.  2 daughters ages 4 and 69 both have had Gardasil.  ROS:  A ROS was performed and pertinent positives and negatives are included.  Exam:  Vitals:   10/13/17 1058  BP: (!) 142/82  Weight: 284 lb (128.8 kg)  Height: 5\' 7"  (1.702 m)   Body mass index is 44.48 kg/m.   General appearance:  Normal Thyroid:  Symmetrical, normal in size, without palpable masses or nodularity. Respiratory  Auscultation:  Clear without wheezing or rhonchi Cardiovascular  Auscultation:  Regular rate, without rubs, murmurs or gallops  Edema/varicosities:  Not grossly evident Abdominal  Soft,nontender, without masses, guarding or rebound.  Liver/spleen:  No organomegaly noted  Hernia:  None appreciated  Skin  Inspection:  Grossly normal   Breasts: Examined lying and sitting.     Right: Without masses, retractions, discharge or axillary adenopathy.     Left: Without masses, retractions, discharge or axillary adenopathy. Gentitourinary   Inguinal/mons:  Normal without inguinal adenopathy  External genitalia:  Normal  BUS/Urethra/Skene's glands:  Normal  Vagina:  Normal scant white discharge, wet prep negative  Cervix:  Normal IUD strings visible  Uterus:   normal in size, shape and contour.  Midline and mobile  Adnexa/parametria:     Rt: Without masses or tenderness.   Lt: Without masses or tenderness.  Anus and perineum: Normal  Digital rectal exam: Normal sphincter tone without palpated masses or tenderness  Assessment/Plan:  37 y.o.  SBF G2 P2  for annual exam with occasional vaginal itching.  10/2014 Mirena IUD with amenorrhea Hypertension, diabetes, hypercholesteremia-primary care manages labs and meds Morbid obesity Vaginal itching  Plan: Diflucan 150 mg 1 dose as needed.  Prescription, proper use given and reviewed importance of glucose control for yeast prevention.  SBE's, annual screening mammogram at 40, calcium rich foods, vitamin D 1000 daily encouraged.  Aware Mirena is good for 5 years.  Reviewed importance of increasing exercise and decreasing calorie/carbs.  GC/chlamydia, Pap with HR HPV typing, new screening guidelines reviewed.    Harrington Challenger [facility], 1:03 PM 10/13/2017

## 2017-10-13 NOTE — Patient Instructions (Signed)
Carbohydrate Counting for Diabetes Mellitus, Adult Carbohydrate counting is a method for keeping track of how many carbohydrates you eat. Eating carbohydrates naturally increases the amount of sugar (glucose) in the blood. Counting how many carbohydrates you eat helps keep your blood glucose within normal limits, which helps you manage your diabetes (diabetes mellitus). It is important to know how many carbohydrates you can safely have in each meal. This is different for every person. A diet and nutrition specialist (registered dietitian) can help you make a meal plan and calculate how many carbohydrates you should have at each meal and snack. Carbohydrates are found in the following foods:  Grains, such as breads and cereals.  Dried beans and soy products.  Starchy vegetables, such as potatoes, peas, and corn.  Fruit and fruit juices.  Milk and yogurt.  Sweets and snack foods, such as cake, cookies, candy, chips, and soft drinks.  How do I count carbohydrates? There are two ways to count carbohydrates in food. You can use either of the methods or a combination of both. Reading "Nutrition Facts" on packaged food The "Nutrition Facts" list is included on the labels of almost all packaged foods and beverages in the [place] It includes:  The serving size.  Information about nutrients in each serving, including the grams (g) of carbohydrate per serving.  To use the "Nutrition Facts":  Decide how many servings you will have.  Multiply the number of servings by the number of carbohydrates per serving.  The resulting number is the total amount of carbohydrates that you will be having.  Learning standard serving sizes of other foods When you eat foods containing carbohydrates that are not packaged or do not include "Nutrition Facts" on the label, you need to measure the servings in order to count the amount of carbohydrates:  Measure the foods that you will eat with a food scale or  measuring cup, if needed.  Decide how many standard-size servings you will eat.  Multiply the number of servings by 15. Most carbohydrate-rich foods have about 15 g of carbohydrates per serving. ? For example, if you eat 8 oz (170 g) of strawberries, you will have eaten 2 servings and 30 g of carbohydrates (2 servings x 15 g = 30 g).  For foods that have more than one food mixed, such as soups and casseroles, you must count the carbohydrates in each food that is included.  The following list contains standard serving sizes of common carbohydrate-rich foods. Each of these servings has about 15 g of carbohydrates:   hamburger bun or  English muffin.   oz (15 mL) syrup.   oz (14 g) jelly.  1 slice of bread.  1 six-inch tortilla.  3 oz (85 g) cooked rice or pasta.  4 oz (113 g) cooked dried beans.  4 oz (113 g) starchy vegetable, such as peas, corn, or potatoes.  4 oz (113 g) hot cereal.  4 oz (113 g) mashed potatoes or  of a large baked potato.  4 oz (113 g) canned or frozen fruit.  4 oz (120 mL) fruit juice.  4-6 crackers.  6 chicken nuggets.  6 oz (170 g) unsweetened dry cereal.  6 oz (170 g) plain fat-free yogurt or yogurt sweetened with artificial sweeteners.  8 oz (240 mL) milk.  8 oz (170 g) fresh fruit or one small piece of fruit.  24 oz (680 g) popped popcorn.  Example of carbohydrate counting Sample meal  3 oz (85 g) chicken breast.  6 oz (170 g) brown rice.  4 oz (113 g) corn.  8 oz (240 mL) milk.  8 oz (170 g) strawberries with sugar-free whipped topping. Carbohydrate calculation 1. Identify the foods that contain carbohydrates: ? Rice. ? Corn. ? Milk. ? Strawberries. 2. Calculate how many servings you have of each food: ? 2 servings rice. ? 1 serving corn. ? 1 serving milk. ? 1 serving strawberries. 3. Multiply each number of servings by 15 g: ? 2 servings rice x 15 g = 30 g. ? 1 serving corn x 15 g = 15 g. ? 1 serving milk x 15  g = 15 g. ? 1 serving strawberries x 15 g = 15 g. 4. Add together all of the amounts to find the total grams of carbohydrates eaten: ? 30 g + 15 g + 15 g + 15 g = 75 g of carbohydrates total. This information is not intended to replace advice given to you by your health care provider. Make sure you discuss any questions you have with your health care provider. Document Released: 01/26/2005 Document Revised: 08/16/2015 Document Reviewed: 07/10/2015 Elsevier Interactive Patient Education  2018 [place] Maintenance, Female Adopting a healthy lifestyle and getting preventive care can go a long way to promote health and wellness. Talk with your health care provider about what schedule of regular examinations is right for you. This is a good chance for you to check in with your provider about disease prevention and staying healthy. In between checkups, there are plenty of things you can do on your own. Experts have done a lot of research about which lifestyle changes and preventive measures are most likely to keep you healthy. Ask your health care provider for more information. Weight and diet Eat a healthy diet  Be sure to include plenty of vegetables, fruits, low-fat dairy products, and lean protein.  Do not eat a lot of foods high in solid fats, added sugars, or salt.  Get regular exercise. This is one of the most important things you can do for your health. ? Most adults should exercise for at least 150 minutes each week. The exercise should increase your heart rate and make you sweat (moderate-intensity exercise). ? Most adults should also do strengthening exercises at least twice a week. This is in addition to the moderate-intensity exercise.  Maintain a healthy weight  Body mass index (BMI) is a measurement that can be used to identify possible weight problems. It estimates body fat based on height and weight. Your health care provider can help determine your BMI and help you  achieve or maintain a healthy weight.  For females 21 years of age and older: ? A BMI below 18.5 is considered underweight. ? A BMI of 18.5 to 24.9 is normal. ? A BMI of 25 to 29.9 is considered overweight. ? A BMI of 30 and above is considered obese.  Watch levels of cholesterol and blood lipids  You should start having your blood tested for lipids and cholesterol at 39 years of age, then have this test every 5 years.  You may need to have your cholesterol levels checked more often if: ? Your lipid or cholesterol levels are high. ? You are older than 39 years of age. ? You are at high risk for heart disease.  Cancer screening Lung Cancer  Lung cancer screening is recommended for adults 44-72 years old who are at high risk for lung cancer because of a history of smoking.  A  yearly low-dose CT scan of the lungs is recommended for people who: ? Currently smoke. ? Have quit within the past 15 years. ? Have at least a 30-pack-year history of smoking. A pack year is smoking an average of one pack of cigarettes a day for 1 year.  Yearly screening should continue until it has been 15 years since you quit.  Yearly screening should stop if you develop a health problem that would prevent you from having lung cancer treatment.  Breast Cancer  Practice breast self-awareness. This means understanding how your breasts normally appear and feel.  It also means doing regular breast self-exams. Let your health care provider know about any changes, no matter how small.  If you are in your 20s or 30s, you should have a clinical breast exam (CBE) by a health care provider every 1-3 years as part of a regular health exam.  If you are 4 or older, have a CBE every year. Also consider having a breast X-ray (mammogram) every year.  If you have a family history of breast cancer, talk to your health care provider about genetic screening.  If you are at high risk for breast cancer, talk to your health  care provider about having an MRI and a mammogram every year.  Breast cancer gene (BRCA) assessment is recommended for women who have family members with BRCA-related cancers. BRCA-related cancers include: ? Breast. ? Ovarian. ? Tubal. ? Peritoneal cancers.  Results of the assessment will determine the need for genetic counseling and BRCA1 and BRCA2 testing.  Cervical Cancer Your health care provider may recommend that you be screened regularly for cancer of the pelvic organs (ovaries, uterus, and vagina). This screening involves a pelvic examination, including checking for microscopic changes to the surface of your cervix (Pap test). You may be encouraged to have this screening done every 3 years, beginning at age 12.  For women ages 13-65, health care providers may recommend pelvic exams and Pap testing every 3 years, or they may recommend the Pap and pelvic exam, combined with testing for human papilloma virus (HPV), every 5 years. Some types of HPV increase your risk of cervical cancer. Testing for HPV may also be done on women of any age with unclear Pap test results.  Other health care providers may not recommend any screening for nonpregnant women who are considered low risk for pelvic cancer and who do not have symptoms. Ask your health care provider if a screening pelvic exam is right for you.  If you have had past treatment for cervical cancer or a condition that could lead to cancer, you need Pap tests and screening for cancer for at least 20 years after your treatment. If Pap tests have been discontinued, your risk factors (such as having a new sexual partner) need to be reassessed to determine if screening should resume. Some women have medical problems that increase the chance of getting cervical cancer. In these cases, your health care provider may recommend more frequent screening and Pap tests.  Colorectal Cancer  This type of cancer can be detected and often  prevented.  Routine colorectal cancer screening usually begins at 39 years of age and continues through 39 years of age.  Your health care provider may recommend screening at an earlier age if you have risk factors for colon cancer.  Your health care provider may also recommend using home test kits to check for hidden blood in the stool.  A small camera at the end of  a tube can be used to examine your colon directly (sigmoidoscopy or colonoscopy). This is done to check for the earliest forms of colorectal cancer.  Routine screening usually begins at age 72.  Direct examination of the colon should be repeated every 5-10 years through 39 years of age. However, you may need to be screened more often if early forms of precancerous polyps or small growths are found.  Skin Cancer  Check your skin from head to toe regularly.  Tell your health care provider about any new moles or changes in moles, especially if there is a change in a mole's shape or color.  Also tell your health care provider if you have a mole that is larger than the size of a pencil eraser.  Always use sunscreen. Apply sunscreen liberally and repeatedly throughout the day.  Protect yourself by wearing long sleeves, pants, a wide-brimmed hat, and sunglasses whenever you are outside.  Heart disease, diabetes, and high blood pressure  High blood pressure causes heart disease and increases the risk of stroke. High blood pressure is more likely to develop in: ? People who have blood pressure in the high end of the normal range (130-139/85-89 mm Hg). ? People who are overweight or obese. ? People who are African American.  If you are 110-26 years of age, have your blood pressure checked every 3-5 years. If you are 32 years of age or older, have your blood pressure checked every year. You should have your blood pressure measured twice-once when you are at a hospital or clinic, and once when you are not at a hospital or clinic.  Record the average of the two measurements. To check your blood pressure when you are not at a hospital or clinic, you can use: ? An automated blood pressure machine at a pharmacy. ? A home blood pressure monitor.  If you are between 57 years and 62 years old, ask your health care provider if you should take aspirin to prevent strokes.  Have regular diabetes screenings. This involves taking a blood sample to check your fasting blood sugar level. ? If you are at a normal weight and have a low risk for diabetes, have this test once every three years after 39 years of age. ? If you are overweight and have a high risk for diabetes, consider being tested at a younger age or more often. Preventing infection Hepatitis B  If you have a higher risk for hepatitis B, you should be screened for this virus. You are considered at high risk for hepatitis B if: ? You were born in a country where hepatitis B is common. Ask your health care provider which countries are considered high risk. ? Your parents were born in a high-risk country, and you have not been immunized against hepatitis B (hepatitis B vaccine). ? You have HIV or AIDS. ? You use needles to inject street drugs. ? You live with someone who has hepatitis B. ? You have had sex with someone who has hepatitis B. ? You get hemodialysis treatment. ? You take certain medicines for conditions, including cancer, organ transplantation, and autoimmune conditions.  Hepatitis C  Blood testing is recommended for: ? Everyone born from [birth year] through 1967. ? Anyone with known risk factors for hepatitis C.  Sexually transmitted infections (STIs)  You should be screened for sexually transmitted infections (STIs) including gonorrhea and chlamydia if: ? You are sexually active and are younger than 39 years of age. ? You are older than  39 years of age and your health care provider tells you that you are at risk for this type of infection. ? Your sexual  activity has changed since you were last screened and you are at an increased risk for chlamydia or gonorrhea. Ask your health care provider if you are at risk.  If you do not have HIV, but are at risk, it may be recommended that you take a prescription medicine daily to prevent HIV infection. This is called pre-exposure prophylaxis (PrEP). You are considered at risk if: ? You are sexually active and do not regularly use condoms or know the HIV status of your partner(s). ? You take drugs by injection. ? You are sexually active with a partner who has HIV.  Talk with your health care provider about whether you are at high risk of being infected with HIV. If you choose to begin PrEP, you should first be tested for HIV. You should then be tested every 3 months for as long as you are taking PrEP. Pregnancy  If you are premenopausal and you may become pregnant, ask your health care provider about preconception counseling.  If you may become pregnant, take 400 to 800 micrograms (mcg) of folic acid every day.  If you want to prevent pregnancy, talk to your health care provider about birth control (contraception). Osteoporosis and menopause  Osteoporosis is a disease in which the bones lose minerals and strength with aging. This can result in serious bone fractures. Your risk for osteoporosis can be identified using a bone density scan.  If you are 29 years of age or older, or if you are at risk for osteoporosis and fractures, ask your health care provider if you should be screened.  Ask your health care provider whether you should take a calcium or vitamin D supplement to lower your risk for osteoporosis.  Menopause may have certain physical symptoms and risks.  Hormone replacement therapy may reduce some of these symptoms and risks. Talk to your health care provider about whether hormone replacement therapy is right for you. Follow these instructions at home:  Schedule regular health, dental,  and eye exams.  Stay current with your immunizations.  Do not use any tobacco products including cigarettes, chewing tobacco, or electronic cigarettes.  If you are pregnant, do not drink alcohol.  If you are breastfeeding, limit how much and how often you drink alcohol.  Limit alcohol intake to no more than 1 drink per day for nonpregnant women. One drink equals 12 ounces of beer, 5 ounces of wine, or 1 ounces of hard liquor.  Do not use street drugs.  Do not share needles.  Ask your health care provider for help if you need support or information about quitting drugs.  Tell your health care provider if you often feel depressed.  Tell your health care provider if you have ever been abused or do not feel safe at home. This information is not intended to replace advice given to you by your health care provider. Make sure you discuss any questions you have with your health care provider. Document Released: 08/11/2010 Document Revised: 07/04/2015 Document Reviewed: 10/30/2014 Elsevier Interactive Patient Education  [employer].

## 2017-10-14 LAB — PAP, TP IMAGING W/ HPV RNA, RFLX HPV TYPE 16,18/45: HPV DNA High Risk: NOT DETECTED

## 2017-10-14 LAB — C. TRACHOMATIS/N. GONORRHOEAE RNA
C. trachomatis RNA, TMA: NOT DETECTED
N. gonorrhoeae RNA, TMA: NOT DETECTED

## 2017-11-11 ENCOUNTER — Other Ambulatory Visit: Payer: Self-pay | Admitting: Family Medicine

## 2017-11-11 DIAGNOSIS — E1165 Type 2 diabetes mellitus with hyperglycemia: Secondary | ICD-10-CM

## 2017-11-18 ENCOUNTER — Other Ambulatory Visit: Payer: Self-pay | Admitting: Family Medicine

## 2017-11-18 DIAGNOSIS — I10 Essential (primary) hypertension: Secondary | ICD-10-CM

## 2017-11-18 DIAGNOSIS — E7800 Pure hypercholesterolemia, unspecified: Secondary | ICD-10-CM

## 2017-11-22 ENCOUNTER — Telehealth: Payer: Self-pay | Admitting: Pharmacist

## 2017-11-22 ENCOUNTER — Encounter: Payer: Self-pay | Admitting: Family Medicine

## 2017-11-22 ENCOUNTER — Ambulatory Visit: Payer: BLUE CROSS/BLUE SHIELD | Attending: Family Medicine | Admitting: Family Medicine

## 2017-11-22 VITALS — BP 156/95 | HR 98 | Temp 97.9°F | Ht 67.0 in | Wt 289.6 lb

## 2017-11-22 DIAGNOSIS — E7800 Pure hypercholesterolemia, unspecified: Secondary | ICD-10-CM | POA: Diagnosis not present

## 2017-11-22 DIAGNOSIS — L659 Nonscarring hair loss, unspecified: Secondary | ICD-10-CM | POA: Insufficient documentation

## 2017-11-22 DIAGNOSIS — Z9049 Acquired absence of other specified parts of digestive tract: Secondary | ICD-10-CM | POA: Insufficient documentation

## 2017-11-22 DIAGNOSIS — Z7984 Long term (current) use of oral hypoglycemic drugs: Secondary | ICD-10-CM | POA: Insufficient documentation

## 2017-11-22 DIAGNOSIS — E1165 Type 2 diabetes mellitus with hyperglycemia: Secondary | ICD-10-CM | POA: Diagnosis not present

## 2017-11-22 DIAGNOSIS — Z79899 Other long term (current) drug therapy: Secondary | ICD-10-CM | POA: Insufficient documentation

## 2017-11-22 DIAGNOSIS — I10 Essential (primary) hypertension: Secondary | ICD-10-CM | POA: Diagnosis not present

## 2017-11-22 DIAGNOSIS — Z6841 Body Mass Index (BMI) 40.0 and over, adult: Secondary | ICD-10-CM

## 2017-11-22 DIAGNOSIS — Z791 Long term (current) use of non-steroidal anti-inflammatories (NSAID): Secondary | ICD-10-CM | POA: Insufficient documentation

## 2017-11-22 DIAGNOSIS — E669 Obesity, unspecified: Secondary | ICD-10-CM | POA: Insufficient documentation

## 2017-11-22 LAB — POCT GLYCOSYLATED HEMOGLOBIN (HGB A1C): Hemoglobin A1C: 11 % — AB (ref 4.0–5.6)

## 2017-11-22 LAB — GLUCOSE, POCT (MANUAL RESULT ENTRY): POC Glucose: 208 mg/dl — AB (ref 70–99)

## 2017-11-22 MED ORDER — LOSARTAN POTASSIUM 100 MG PO TABS: 100.0000 mg | ORAL_TABLET | Freq: Every day | ORAL | 6 refills | Status: DC

## 2017-11-22 MED ORDER — SIMVASTATIN 40 MG PO TABS: 40.0000 mg | ORAL_TABLET | Freq: Every day | ORAL | 6 refills | Status: DC

## 2017-11-22 MED ORDER — LIRAGLUTIDE 18 MG/3ML ~~LOC~~ SOPN: PEN_INJECTOR | SUBCUTANEOUS | 3 refills | Status: DC

## 2017-11-22 NOTE — Progress Notes (Signed)
Subjective:  Patient ID: Lynn Anderson, female    DOB: [birth year]  Age: 37 y.o. MRN: [id number]  CC: Diabetes   HPI Lynn Anderson  is a 37 year old female with history of type 2 diabetes mellitus (A1c 11.2), hypertension, hyperlipidemia here for a follow-up visit. She endorses compliance with glipizide but states her sugars have been high.  Previously unable to tolerate metformin due to diarrhea. Her A1c is 11.0 which has trended up from 10.6 previously.  She has a problem with excessive ingestion of carbs and does not exercise regularly.  She denies visual concerns, numbness in extremities or hypoglycemia and is not up-to-date on an annual eye exam. Her blood pressure is elevated and she endorses taking her lisinopril but is would like to come off it due to history of lipedema in some family members well on lisinopril. She also thinks Lipitor caused her hair to fall out and she would like to discontinue this. She denies additional concerns today.  Past Medical History:  Diagnosis Date  . Diabetes mellitus without complication ([place])   . Hyperlipidemia   . Hypertension     Past Surgical History:  Procedure Laterality Date  . CHOLECYSTECTOMY    . Mirena      Inserted 10-16-14    No Known Allergies   Outpatient Medications Prior to Visit  Medication Sig Dispense Refill  . fluconazole (DIFLUCAN) 150 MG tablet Take 1 tablet (150 mg total) by mouth daily. For 3 days then one tablet weekly for 3 month 15 tablet 0  . glipiZIDE (GLUCOTROL) 10 MG tablet TAKE 1 TABLET (10 MG TOTAL) BY MOUTH 2 (TWO) TIMES DAILY BEFORE A MEAL. (STOP METFORMIN) 60 tablet 0  . ibuprofen (ADVIL,MOTRIN) 200 MG tablet Take 200 mg by mouth every 6 (six) hours as needed.    Marland Kitchen levonorgestrel (MIRENA) 20 MCG/24HR IUD 1 each by Intrauterine route once.    Glory Rosebush DELICA LANCETS FINE MISC Use to test blood sugar 2 times daily as instructed. 100 each 2  . atorvastatin (LIPITOR) 20 MG tablet TAKE 1 TABLET BY  MOUTH EVERY DAY 90 tablet 0  . lisinopril (PRINIVIL,ZESTRIL) 5 MG tablet TAKE 1 TABLET BY MOUTH EVERY DAY 30 tablet 0  . Blood Glucose Monitoring Suppl (ONE TOUCH ULTRA 2) w/Device KIT 1 each by Does not apply route 3 (three) times daily. (Patient not taking: Reported on 11/22/2017) 1 each 0  . glucose blood (ONETOUCH VERIO) test strip Use to test blood sugar 2 times daily as instructed (Patient not taking: Reported on 11/22/2017) 100 each 2  . Lancets (ONETOUCH ULTRASOFT) lancets Use as instructed (Patient not taking: Reported on 11/22/2017) 100 each 12   No facility-administered medications prior to visit.     ROS Review of Systems  Constitutional: Negative for activity change, appetite change and fatigue.  HENT: Negative for congestion, sinus pressure and sore throat.   Eyes: Negative for visual disturbance.  Respiratory: Negative for cough, chest tightness, shortness of breath and wheezing.   Cardiovascular: Negative for chest pain and palpitations.  Gastrointestinal: Negative for abdominal distention, abdominal pain and constipation.  Endocrine: Negative for polydipsia.  Genitourinary: Negative for dysuria and frequency.  Musculoskeletal: Negative for arthralgias and back pain.  Skin: Negative for rash.  Neurological: Negative for tremors, light-headedness and numbness.  Hematological: Does not bruise/bleed easily.  Psychiatric/Behavioral: Negative for agitation and behavioral problems.    Objective:  BP (!) 156/95   Pulse 98   Temp 97.9 F (36.6 C) (Oral)  Ht _0  (1.702 m)   Wt 289 lb 9.6 oz (131.4 kg)   SpO2 98%   BMI 45.36 kg/m   BP/Weight 11/22/2017 10/13/2017 8/92/1194  Systolic BP 174 081 448  Diastolic BP 95 82 185  Wt. (Lbs) 289.6 284 288  BMI 45.36 44.48 45.11      Physical Exam  Constitutional: She is oriented to person, place, and time. She appears well-developed and well-nourished.  Obese  Cardiovascular: Normal rate, normal heart sounds and intact  distal pulses.  No murmur heard. Pulmonary/Chest: Effort normal and breath sounds normal. She has no wheezes. She has no rales. She exhibits no tenderness.  Abdominal: Soft. Bowel sounds are normal. She exhibits no distension and no mass. There is no tenderness.  Musculoskeletal: Normal range of motion.  Neurological: She is alert and oriented to person, place, and time.  Skin: Skin is warm and dry.  Psychiatric: She has a normal mood and affect.     CMP Latest Ref Rng & Units 07/21/2017 10/07/2016 09/28/2014  Glucose 65 - 99 mg/dL 278(H) 252(H) 247(H)  BUN 6 - 20 mg/dL _1 Creatinine 0.44 - 1.00 mg/dL 0.72 0.78 0.67  Sodium 135 - 145 mmol/L 135 136 135  Potassium 3.5 - 5.1 mmol/L 3.9 4.1 4.4  Chloride 101 - 111 mmol/L 104 101 101  CO2 22 - 32 mmol/L 21(L) 18(L) 21  Calcium 8.9 - 10.3 mg/dL 8.2(L) 9.1 9.0  Total Protein 6.5 - 8.1 g/dL 6.7 7.1 6.9  Total Bilirubin 0.3 - 1.2 mg/dL 0.7 0.5 0.4  Alkaline Phos 38 - 126 U/L 63 68 68  AST 15 - 41 U/L 42(H) 27 58(H)  ALT 14 - 54 U/L 40 32(H) 60(H)    Lipid Panel     Component Value Date/Time   CHOL 279 (H) 10/07/2016 1131   TRIG 274 (H) 10/07/2016 1131   HDL 52 10/07/2016 1131   CHOLHDL 5.4 (H) 10/07/2016 1131   VLDL 55 (H) 10/07/2016 1131   LDLCALC 172 (H) 10/07/2016 1131    Lab Results  Component Value Date   HGBA1C 11.0 (A) 11/22/2017    Assessment & Plan:   1. Type 2 diabetes mellitus with hyperglycemia, without long-term current use of insulin (HCC) Uncontrolled with A1c of 11.0 which has trended up from 10.6 Victoza added to regimen Clinical pharmacist called in to perform education on titrating up Victoza dose and  - POCT glucose (manual entry) - POCT glycosylated hemoglobin (Hb A1C) - Ambulatory referral to Ophthalmology - CMP14+EGFR; Future - Lipid panel; Future - Microalbumin/Creatinine Ratio, Urine; Future - T4, free; Future - TSH; Future  2. Hair loss She attributes this to Lipitor which I have changed  to Simvastatin Will check thyroid function  3. Essential hypertension Discontinue Lisinopril as per patient request due to concern of lip edema in family members Commence Losartan  4. Pure hypercholesterolemia Uncontrolled Commenced Simvastatin Low cholesterol diet  5. Class 3 severe obesity due to excess calories with serious comorbidity and body mass index (BMI) of 40.0 to 44.9 in adult [facility]) Increase physical activity and reduce portion sizes.   Meds ordered this encounter  Medications  . losartan (COZAAR) 100 MG tablet    Sig: Take 1 tablet (100 mg total) by mouth daily.    Dispense:  30 tablet    Refill:  6    Discontinue lisinopril  . liraglutide (VICTOZA) 18 MG/3ML SOPN    Sig: Inject 0.6 mg daily in the morning for 1 week  then 1.2 mg daily for 1 week then 1.8 mg thereafter    Dispense:  3 pen    Refill:  3  . simvastatin (ZOCOR) 40 MG tablet    Sig: Take 1 tablet (40 mg total) by mouth at bedtime.    Dispense:  30 tablet    Refill:  6    Discontinue Lipitor    Follow-up: Return in about 3 months (around 02/22/2018) for Follow-up on chronic medical conditions.   Charlott Rakes MD

## 2017-11-22 NOTE — Patient Instructions (Signed)

## 2017-11-22 NOTE — Telephone Encounter (Signed)
Patient was educated on the use of the Victoza pen. Reviewed necessary supplies and operation of the pen. Also reviewed goal blood glucose levels. Patient was able to demonstrate use. All questions and concerns were addressed.  

## 2017-11-24 ENCOUNTER — Telehealth: Payer: Self-pay | Admitting: Family Medicine

## 2017-11-24 DIAGNOSIS — E1165 Type 2 diabetes mellitus with hyperglycemia: Secondary | ICD-10-CM

## 2017-11-24 NOTE — Telephone Encounter (Signed)
1) Medication(s) Requested (by name): She needs the needles for the victoza 2) Pharmacy [facility] Choice: [facility] [address] 3) Special Requests:   Approved medications will be sent to the pharmacy, we will reach out if there is an issue.  Requests made after 3pm may not be addressed until the following business day!  If a patient is unsure of the name of the medication(s) please note and ask patient to call back when they are able to provide all info, do not send to responsible party until all information is available!

## 2017-11-26 ENCOUNTER — Ambulatory Visit: Payer: BLUE CROSS/BLUE SHIELD | Attending: Family Medicine

## 2017-11-26 DIAGNOSIS — E1165 Type 2 diabetes mellitus with hyperglycemia: Secondary | ICD-10-CM | POA: Insufficient documentation

## 2017-11-26 MED ORDER — INSULIN PEN NEEDLE 32G X 4 MM MISC: 3 refills | Status: DC

## 2017-11-26 NOTE — Progress Notes (Signed)
Patient here for lab visit only 

## 2017-11-27 LAB — CMP14+EGFR
ALT: 18 IU/L (ref 0–32)
AST: 12 IU/L (ref 0–40)
Albumin/Globulin Ratio: 1.4 (ref 1.2–2.2)
Albumin: 3.9 g/dL (ref 3.5–5.5)
Alkaline Phosphatase: 63 IU/L (ref 39–117)
BUN/Creatinine Ratio: 10 (ref 9–23)
BUN: 8 mg/dL (ref 6–20)
Bilirubin Total: 0.5 mg/dL (ref 0.0–1.2)
CO2: 20 mmol/L (ref 20–29)
Calcium: 9.2 mg/dL (ref 8.7–10.2)
Chloride: 99 mmol/L (ref 96–106)
Creatinine, Ser: 0.81 mg/dL (ref 0.57–1.00)
GFR calc Af Amer: 106 mL/min/{1.73_m2} (ref >59)
GFR calc non Af Amer: 92 mL/min/{1.73_m2} (ref >59)
Globulin, Total: 2.8 g/dL (ref 1.5–4.5)
Glucose: 216 mg/dL — ABNORMAL HIGH (ref 65–99)
Potassium: 4.1 mmol/L (ref 3.5–5.2)
Sodium: 136 mmol/L (ref 134–144)
Total Protein: 6.7 g/dL (ref 6.0–8.5)

## 2017-11-27 LAB — LIPID PANEL
Chol/HDL Ratio: 4.9 ratio — ABNORMAL HIGH (ref 0.0–4.4)
Cholesterol, Total: 214 mg/dL — ABNORMAL HIGH (ref 100–199)
HDL: 44 mg/dL (ref >39)
LDL Calculated: 125 mg/dL — ABNORMAL HIGH (ref 0–99)
Triglycerides: 223 mg/dL — ABNORMAL HIGH (ref 0–149)
VLDL Cholesterol Cal: 45 mg/dL — ABNORMAL HIGH (ref 5–40)

## 2017-11-27 LAB — MICROALBUMIN / CREATININE URINE RATIO
Creatinine, Urine: 113.6 mg/dL
Microalb/Creat Ratio: 8.5 mg/g creat (ref 0.0–30.0)
Microalbumin, Urine: 9.7 ug/mL

## 2017-11-27 LAB — T4, FREE: Free T4: 1.22 ng/dL (ref 0.82–1.77)

## 2017-11-27 LAB — TSH: TSH: 4.64 u[IU]/mL — ABNORMAL HIGH (ref 0.450–4.500)

## 2017-11-29 ENCOUNTER — Other Ambulatory Visit: Payer: Self-pay | Admitting: Family Medicine

## 2017-11-29 DIAGNOSIS — R7989 Other specified abnormal findings of blood chemistry: Secondary | ICD-10-CM

## 2017-11-30 ENCOUNTER — Ambulatory Visit: Payer: BLUE CROSS/BLUE SHIELD | Admitting: Pharmacist

## 2017-12-02 ENCOUNTER — Encounter: Payer: Self-pay | Admitting: Family Medicine

## 2017-12-02 DIAGNOSIS — H2513 Age-related nuclear cataract, bilateral: Secondary | ICD-10-CM | POA: Diagnosis not present

## 2017-12-02 DIAGNOSIS — H10413 Chronic giant papillary conjunctivitis, bilateral: Secondary | ICD-10-CM | POA: Diagnosis not present

## 2017-12-02 DIAGNOSIS — E119 Type 2 diabetes mellitus without complications: Secondary | ICD-10-CM | POA: Diagnosis not present

## 2017-12-02 LAB — HM DIABETES EYE EXAM

## 2017-12-03 ENCOUNTER — Ambulatory Visit: Payer: BLUE CROSS/BLUE SHIELD | Attending: Family Medicine | Admitting: Pharmacist

## 2017-12-03 ENCOUNTER — Encounter: Payer: Self-pay | Admitting: Pharmacist

## 2017-12-03 ENCOUNTER — Ambulatory Visit: Payer: BLUE CROSS/BLUE SHIELD | Admitting: Pharmacist

## 2017-12-03 DIAGNOSIS — Z79899 Other long term (current) drug therapy: Secondary | ICD-10-CM | POA: Insufficient documentation

## 2017-12-03 DIAGNOSIS — E119 Type 2 diabetes mellitus without complications: Secondary | ICD-10-CM | POA: Insufficient documentation

## 2017-12-03 DIAGNOSIS — E1165 Type 2 diabetes mellitus with hyperglycemia: Secondary | ICD-10-CM

## 2017-12-03 DIAGNOSIS — Z833 Family history of diabetes mellitus: Secondary | ICD-10-CM | POA: Insufficient documentation

## 2017-12-03 DIAGNOSIS — Z7984 Long term (current) use of oral hypoglycemic drugs: Secondary | ICD-10-CM | POA: Insufficient documentation

## 2017-12-03 LAB — GLUCOSE, POCT (MANUAL RESULT ENTRY): POC Glucose: 311 mg/dl — AB (ref 70–99)

## 2017-12-03 NOTE — Patient Instructions (Signed)
Thank you for coming to see me today. Please do the following:  1. Increase Victoza 1.2 mg daily. 2. Continue checking blood sugars at home. 3. Continue making the lifestyle changes we've discussed together during our visit. Diet and exercise play a significant role in improving your blood sugars.  4. Follow-up with me in 1 month.   Hypoglycemia or low blood sugar:   Low blood sugar can happen quickly and may become an emergency if not treated right away.   While this shouldn't happen often, it can be brought upon if you skip a meal or do not eat enough. Also, if your insulin or other diabetes medications are dosed too high, this can cause your blood sugar to go to low.   Warning signs of low blood sugar include: 1. Feeling shaky or dizzy 2. Feeling weak or tired  3. Excessive hunger 4. Feeling anxious or upset  5. Sweating even when you aren't exercising  What to do if I experience low blood sugar? 1. Check your blood sugar with your meter. If lower than 70, proceed to step 2.  2. Treat with 3-4 glucose tablets or 3 packets of regular sugar. If these aren't around, you can try hard candy. Yet another option would be to drink 4 ounces of fruit juice or 6 ounces of REGULAR soda.  3. Re-check your sugar in 15 minutes. If it is still below 70, do what you did in step 2 again. If has come back up, go ahead and eat a snack or small meal at this time.

## 2017-12-03 NOTE — Progress Notes (Signed)
    S:    PCP: Dr. Alvis Lemmings  Patient arrives good spirits.  Presents for Victoza titration at the request of Dr. Alvis Lemmings.. Patient was referred and last seen by PCP on 11/22/17.    Family/Social History:  - FH: DM (mother) - Tobacco: never smoker  - Alcohol: denies use   Insurance coverage/medication affordability:  - BCBS  Patient reports adherence with medications. Tolerating Victoza well. Current diabetes medications include:  - Vitctoza 0.6 mg daily Current hypertension medications include:  - losartan 100 mg   Patient reports hypoglycemic events.  Patient reported dietary habits:  - reports having "problem" with carb heavy food  Patient-reported exercise habits:  - "a couple of times a week" - reports having membership to [facility]   Patient reports polyuria, polydipsia.  Patient reports neuropathy. Patient denies visual changes. Patient reports self foot exams.   O:  POCT: 311 Home fasting CBG: reports 200s; took this AM 220  2-hour post prandial CBG: doesn't take  Lab Results  Component Value Date   HGBA1C 11.0 (A) 11/22/2017   There were no vitals filed for this visit.  Lipid Panel     Component Value Date/Time   CHOL 214 (H) 11/26/2017 0842   TRIG 223 (H) 11/26/2017 0842   HDL 44 11/26/2017 0842   CHOLHDL 4.9 (H) 11/26/2017 0842   CHOLHDL 5.4 (H) 10/07/2016 1131   VLDL 55 (H) 10/07/2016 1131   LDLCALC 125 (H) 11/26/2017 1610    Clinical ASCVD: No  10 year ASCVD risk: cannot be calculated   A/P: Diabetes longstanding currently uncontrolled. Patient is able to verbalize appropriate hypoglycemia management plan. Patient is adherent with medication. Control is suboptimal due to dietary indiscretion. Cannot tolerate metformin.  -Increased dose of Victoza (liraglutide) to 1.2 mg daily.  -Extensively discussed pathophysiology of DM, recommended lifestyle interventions, dietary effects on glycemic control -Counseled on s/sx of and management of  hypoglycemia -Next A1C anticipated 02/2018.  -HM: Pneumovax, influenza, Tdap, foot exam all due - will address at f/u in 1 month.   ASCVD risk - primary prevention in patient with DM. Last LDL is not controlled. ASCVD risk score is not >20%  - moderate intensity statin indicated. Lisinopril changed to losartan d/t fam hx of angioedema.  -Continued simvastatin 40 mg.   Written patient instructions provided.  Total time in face to face counseling 15 minutes.   Follow up PharmacistClinic Visit in 1 month.     Patient seen with:  Leanne Chang, PharmD Candidate [employer] of 2021  Butch Penny, PharmD, CPP Clinical Pharmacist [facility] [phone]

## 2017-12-07 ENCOUNTER — Telehealth: Payer: Self-pay

## 2017-12-07 NOTE — Telephone Encounter (Signed)
-----   Message from Hoy Register, MD sent at 11/29/2017  1:45 PM EDT ----- Thyroid is abnormal which could explain her hair loss.  I have ordered a repeat in 1 month to confirm this and if still abnormal she would need to commence medications.  Cholesterol is elevated and I will encourage her to comply with the new medication for cholesterol which was initiated at her last visit as well as low-cholesterol diet and exercise.

## 2017-12-07 NOTE — Telephone Encounter (Signed)
Patient was called and informed of lab results and patient was also informed to repeat labs in one month. No appointment needed lab are already in epic.

## 2017-12-21 ENCOUNTER — Other Ambulatory Visit (HOSPITAL_COMMUNITY)
Admission: RE | Admit: 2017-12-21 | Discharge: 2017-12-21 | Disposition: A | Discharge status: Home / Self Care | Payer: BLUE CROSS/BLUE SHIELD | Source: Ambulatory Visit | Attending: Nurse Practitioner | Admitting: Nurse Practitioner

## 2017-12-21 ENCOUNTER — Ambulatory Visit: Payer: BLUE CROSS/BLUE SHIELD | Admitting: Nurse Practitioner

## 2017-12-21 ENCOUNTER — Encounter: Payer: Self-pay | Admitting: Nurse Practitioner

## 2017-12-21 VITALS — BP 142/94 | HR 97 | Temp 98.2°F | Ht 67.0 in | Wt 288.0 lb

## 2017-12-21 DIAGNOSIS — Z8249 Family history of ischemic heart disease and other diseases of the circulatory system: Secondary | ICD-10-CM

## 2017-12-21 DIAGNOSIS — Z833 Family history of diabetes mellitus: Secondary | ICD-10-CM

## 2017-12-21 DIAGNOSIS — R109 Unspecified abdominal pain: Secondary | ICD-10-CM | POA: Insufficient documentation

## 2017-12-21 DIAGNOSIS — E1165 Type 2 diabetes mellitus with hyperglycemia: Secondary | ICD-10-CM | POA: Insufficient documentation

## 2017-12-21 DIAGNOSIS — E785 Hyperlipidemia, unspecified: Secondary | ICD-10-CM | POA: Insufficient documentation

## 2017-12-21 DIAGNOSIS — Z794 Long term (current) use of insulin: Secondary | ICD-10-CM

## 2017-12-21 DIAGNOSIS — I10 Essential (primary) hypertension: Secondary | ICD-10-CM | POA: Insufficient documentation

## 2017-12-21 DIAGNOSIS — Z79899 Other long term (current) drug therapy: Secondary | ICD-10-CM

## 2017-12-21 DIAGNOSIS — Z9049 Acquired absence of other specified parts of digestive tract: Secondary | ICD-10-CM | POA: Insufficient documentation

## 2017-12-21 LAB — POCT URINALYSIS DIP (CLINITEK)
Bilirubin, UA: NEGATIVE
Blood, UA: NEGATIVE
Glucose, UA: 500 mg/dL — AB
Ketones, POC UA: NEGATIVE mg/dL
Leukocytes, UA: NEGATIVE
Nitrite, UA: NEGATIVE
POC,PROTEIN,UA: NEGATIVE
Spec Grav, UA: 1.015 (ref 1.010–1.025)
Urobilinogen, UA: 0.2 E.U./dL
pH, UA: 5.5 (ref 5.0–8.0)

## 2017-12-21 LAB — GLUCOSE, POCT (MANUAL RESULT ENTRY): POC Glucose: 205 mg/dl — AB (ref 70–99)

## 2017-12-21 NOTE — Progress Notes (Signed)
Assessment & Plan:  Lynn Anderson was seen today for back pain.  Diagnoses and all orders for this visit:  Left flank pain -     POCT URINALYSIS DIP (CLINITEK) -     Urine cytology ancillary only  Type 2 diabetes mellitus with hyperglycemia, without long-term current use of insulin (HCC) -     Glucose (CBG)    Patient has been counseled on age-appropriate routine health concerns for screening and prevention. These are reviewed and up-to-date. Referrals have been placed accordingly. Immunizations are up-to-date or declined.    Subjective:   Chief Complaint  Patient presents with  . Back Pain    Pt. stated she think she may have a UTI, but she is only having lower back pain. No vaginal discharge, vaginal ithcing, or odor.    HPI Lynn Anderson 38 y.o. female presents to office today with complaints of left sided flank pain which is currently resolving.   UTI Symptoms Left sided flank pain. Onset 3 days. Initially pain was 8/10 and now 1/10. She notes the pain onset was sudden without any injury or trauma. Location started off in the low back with radiation of pain to the left side. She didn't take any medications for the pain which resolved on its own. She denies any hematuria, dysuria, nausea, vomiting, fever, pelvic or abdominal pain. She would also like to be screened for a yeast infection as she reports a history of recurrent yeast infections due to her diabetes. She denies any GU symptoms.   Review of Systems  Constitutional: Negative for fever, malaise/fatigue and weight loss.  HENT: Negative.  Negative for nosebleeds.   Eyes: Negative.  Negative for blurred vision, double vision and photophobia.  Respiratory: Negative.  Negative for cough and shortness of breath.   Cardiovascular: Negative.  Negative for chest pain, palpitations and leg swelling.  Gastrointestinal: Negative.  Negative for heartburn, nausea and vomiting.  Genitourinary: Positive for flank pain. Negative for  dysuria, frequency, hematuria and urgency.  Musculoskeletal: Negative for myalgias.  Neurological: Negative.  Negative for dizziness, focal weakness, seizures and headaches.  Psychiatric/Behavioral: Negative.  Negative for suicidal ideas.    Past Medical History:  Diagnosis Date  . Diabetes mellitus without complication ([place])   . Hyperlipidemia   . Hypertension     Past Surgical History:  Procedure Laterality Date  . CHOLECYSTECTOMY    . Mirena      Inserted 10-16-14    Family History  Problem Relation Age of Onset  . Hyperlipidemia Mother   . Hypertension Mother   . Diabetes Mother   . Diabetes Maternal Grandmother   . Heart disease Father   . Diabetes Maternal Aunt     Social History Reviewed with no changes to be made today.   Outpatient Medications Prior to Visit  Medication Sig Dispense Refill  . ibuprofen (ADVIL,MOTRIN) 200 MG tablet Take 200 mg by mouth every 6 (six) hours as needed.    . Insulin Pen Needle (TRUEPLUS PEN NEEDLES) 32G X 4 MM MISC Use as directed once daily to administer victoza 100 each 3  . levonorgestrel (MIRENA) 20 MCG/24HR IUD 1 each by Intrauterine route once.    . liraglutide (VICTOZA) 18 MG/3ML SOPN Inject 0.6 mg daily in the morning for 1 week then 1.2 mg daily for 1 week then 1.8 mg thereafter 3 pen 3  . losartan (COZAAR) 100 MG tablet Take 1 tablet (100 mg total) by mouth daily. 30 tablet 6  . [facility] DELICA  LANCETS FINE MISC Use to test blood sugar 2 times daily as instructed. 100 each 2  . simvastatin (ZOCOR) 40 MG tablet Take 1 tablet (40 mg total) by mouth at bedtime. 30 tablet 6  . Blood Glucose Monitoring Suppl (ONE TOUCH ULTRA 2) w/Device KIT 1 each by Does not apply route 3 (three) times daily. (Patient not taking: Reported on 11/22/2017) 1 each 0  . fluconazole (DIFLUCAN) 150 MG tablet Take 1 tablet (150 mg total) by mouth daily. For 3 days then one tablet weekly for 3 month 15 tablet 0  . glucose blood (ONETOUCH VERIO) test strip  Use to test blood sugar 2 times daily as instructed (Patient not taking: Reported on 11/22/2017) 100 each 2  . Lancets (ONETOUCH ULTRASOFT) lancets Use as instructed (Patient not taking: Reported on 11/22/2017) 100 each 12   No facility-administered medications prior to visit.     No Known Allergies     Objective:    BP (!) 142/94 (BP Location: Left Arm, Patient Position: Sitting, Cuff Size: Large)   Pulse 97   Temp 98.2 F (36.8 C) (Oral)   Ht '5\' 7"'  (1.702 m)   Wt 288 lb (130.6 kg)   SpO2 100%   BMI 45.11 kg/m  Wt Readings from Last 3 Encounters:  12/21/17 288 lb (130.6 kg)  11/22/17 289 lb 9.6 oz (131.4 kg)  10/13/17 284 lb (128.8 kg)    Physical Exam  Constitutional: She is oriented to person, place, and time. She appears well-developed and well-nourished. She is cooperative.  HENT:  Head: Normocephalic and atraumatic.  Eyes: EOM are normal.  Neck: Normal range of motion.  Cardiovascular: Normal rate, regular rhythm and normal heart sounds. Exam reveals no gallop and no friction rub.  No murmur heard. Pulmonary/Chest: Effort normal and breath sounds normal. No tachypnea. No respiratory distress. She has no decreased breath sounds. She has no wheezes. She has no rhonchi. She has no rales. She exhibits no tenderness.  Abdominal: Soft. Bowel sounds are normal. She exhibits no distension and no mass. There is no tenderness. There is no rebound and no guarding. No hernia.  Musculoskeletal: Normal range of motion. She exhibits no edema.  Neurological: She is alert and oriented to person, place, and time. Coordination normal.  Skin: Skin is warm and dry.  Psychiatric: She has a normal mood and affect. Her behavior is normal. Judgment and thought content normal.  Nursing note and vitals reviewed.     Patient has been counseled extensively about nutrition and exercise as well as the importance of adherence with medications and regular follow-up. The patient was given clear  instructions to go to ER or return to medical center if symptoms don't improve, worsen or new problems develop. The patient verbalized understanding.   Follow-up: Return with PCP as scheduled for diabetes.   Gildardo Pounds, FNP-BC [facility] [place], [place]   12/21/2017, 10:20 AM

## 2017-12-21 NOTE — Patient Instructions (Signed)
It was a pleasure meeting you today!

## 2017-12-22 LAB — URINE CYTOLOGY ANCILLARY ONLY
Chlamydia: NEGATIVE
Neisseria Gonorrhea: NEGATIVE
Trichomonas: NEGATIVE

## 2017-12-24 LAB — URINE CYTOLOGY ANCILLARY ONLY
Bacterial vaginitis: NEGATIVE
Candida vaginitis: NEGATIVE

## 2017-12-29 ENCOUNTER — Telehealth: Payer: Self-pay

## 2017-12-29 NOTE — Telephone Encounter (Signed)
CMA spoke to patient to inform on results.  Pt. Verified DOB. Pt. Understood.  

## 2017-12-29 NOTE — Telephone Encounter (Signed)
-----   Message from Claiborne RiggZelda W Fleming, NP sent at 12/27/2017  8:32 PM EST ----- Cytology negative for yeast and bacteria

## 2017-12-30 ENCOUNTER — Ambulatory Visit: Payer: BLUE CROSS/BLUE SHIELD | Attending: Family Medicine

## 2017-12-30 DIAGNOSIS — R7989 Other specified abnormal findings of blood chemistry: Secondary | ICD-10-CM

## 2017-12-31 ENCOUNTER — Other Ambulatory Visit: Payer: Self-pay | Admitting: Family Medicine

## 2017-12-31 LAB — TSH: TSH: 2.26 u[IU]/mL (ref 0.450–4.500)

## 2017-12-31 LAB — T4, FREE: Free T4: 1.1 ng/dL (ref 0.82–1.77)

## 2018-01-03 ENCOUNTER — Ambulatory Visit: Payer: BLUE CROSS/BLUE SHIELD | Attending: Family Medicine | Admitting: Pharmacist

## 2018-01-03 ENCOUNTER — Encounter: Payer: Self-pay | Admitting: Pharmacist

## 2018-01-03 ENCOUNTER — Telehealth: Payer: Self-pay

## 2018-01-03 DIAGNOSIS — E1165 Type 2 diabetes mellitus with hyperglycemia: Secondary | ICD-10-CM | POA: Diagnosis not present

## 2018-01-03 DIAGNOSIS — E119 Type 2 diabetes mellitus without complications: Secondary | ICD-10-CM | POA: Diagnosis not present

## 2018-01-03 DIAGNOSIS — I10 Essential (primary) hypertension: Secondary | ICD-10-CM | POA: Diagnosis not present

## 2018-01-03 LAB — GLUCOSE, POCT (MANUAL RESULT ENTRY): POC Glucose: 213 mg/dl — AB (ref 70–99)

## 2018-01-03 MED ORDER — LIRAGLUTIDE 18 MG/3ML ~~LOC~~ SOPN: 1.8000 mg | PEN_INJECTOR | Freq: Every day | SUBCUTANEOUS | 2 refills | Status: DC

## 2018-01-03 NOTE — Progress Notes (Signed)
    S:    PCP: Dr. Alvis LemmingsNewlin  Patient arrives good spirits.  Presents for Victoza titration at the request of Dr. Alvis LemmingsNewlin. Patient was referred and last seen by PCP on 11/22/17. I last saw her 12/03/17. We increased Victoza to 1.2 mg daily.   Family/Social History:  - FH: DM (mother) - Tobacco: never smoker  - Alcohol: denies use   Insurance coverage/medication affordability:  - BCBS  Patient reports adherence with medications. Tolerating Victoza well. Current diabetes medications include:  - Vitctoza 1.8 mg daily Current hypertension medications include:  - losartan 100 mg   Patient denies hypoglycemic events.  Patient reported dietary habits:  - reports having "problem" with carb heavy food  Patient-reported exercise habits:  - "a couple of times a week" - [facility]   Patient denies polyuria, polydipsia. Reports polyphagia at night.  Patient denies neuropathy. Patient denies visual changes. Patient reports self foot exams.   O:  POCT: 213 Home fasting CBG: 160s - 250s Lab Results  Component Value Date   HGBA1C 11.0 (A) 11/22/2017   There were no vitals filed for this visit.  Lipid Panel     Component Value Date/Time   CHOL 214 (H) 11/26/2017 0842   TRIG 223 (H) 11/26/2017 0842   HDL 44 11/26/2017 0842   CHOLHDL 4.9 (H) 11/26/2017 0842   CHOLHDL 5.4 (H) 10/07/2016 1131   VLDL 55 (H) 10/07/2016 1131   LDLCALC 125 (H) 11/26/2017 16100842   Clinical ASCVD: No  10 year ASCVD risk: cannot be calculated   A/P: Diabetes longstanding currently uncontrolled. Patient is able to verbalize appropriate hypoglycemia management plan. Patient is adherent with medication. Control is suboptimal due to dietary indiscretion.   Pt is not due to have an A1c until 02/2018. With her improvement in home sugars, would expect improvement in A1c. She may achieve control with addition of an oral medication. I recommend Jardiance should she need continued A1c lowering  in the future. She cannot tolerate glipizide or metformin. Will continue Victoza for now.   Her insurance is renewing in 2020 and she states that she will be establishing with a new doctor. I recommended for her to make an earlier appointment with Dr. Alvis LemmingsNewlin.  -Continued Victoza 1.8 mg daily. -Extensively discussed pathophysiology of DM, recommended lifestyle interventions, dietary effects on glycemic control -Counseled on s/sx of and management of hypoglycemia -Next A1C anticipated 02/2018.  -HM: Pneumovax, influenza, Tdap, foot exam all due; pt refused  ASCVD risk - primary prevention in patient with DM. Last LDL is not controlled. ASCVD risk score is not >20%  - moderate intensity statin indicated. -Continued and encouraged adherence to simvastatin 40 mg.   Written patient instructions provided.  Total time in face to face counseling 15 minutes.   Follow-up with PCP.  Butch PennyLuke Van Ausdall, PharmD, CPP Clinical Pharmacist [facility] [phone]

## 2018-01-03 NOTE — Patient Instructions (Addendum)
Thank you for coming to see me today. Please do the following:  1. Continue Victoza 1.8 mg daily.  2. Continue checking blood sugars at home.  3. Continue making the lifestyle changes we've discussed together during our visit. Diet and exercise play a significant role in improving your blood sugars.   Follow-up: Try to reschedule your appointment with Dr. Alvis LemmingsNewlin for December.

## 2018-01-03 NOTE — Telephone Encounter (Signed)
-----   Message from Hoy RegisterEnobong Newlin, MD sent at 12/31/2017 10:55 AM EST ----- Please inform the patient that labs are normal. Thank you.

## 2018-01-03 NOTE — Telephone Encounter (Signed)
Patient was called and informed of lab results. 

## 2018-01-05 ENCOUNTER — Ambulatory Visit: Payer: BLUE CROSS/BLUE SHIELD | Attending: Family Medicine | Admitting: Family Medicine

## 2018-01-05 ENCOUNTER — Encounter: Payer: Self-pay | Admitting: Family Medicine

## 2018-01-05 VITALS — BP 142/95 | HR 101 | Temp 98.1°F | Ht 67.0 in | Wt 283.4 lb

## 2018-01-05 DIAGNOSIS — Z9889 Other specified postprocedural states: Secondary | ICD-10-CM | POA: Insufficient documentation

## 2018-01-05 DIAGNOSIS — Z794 Long term (current) use of insulin: Secondary | ICD-10-CM | POA: Insufficient documentation

## 2018-01-05 DIAGNOSIS — R51 Headache: Secondary | ICD-10-CM | POA: Diagnosis not present

## 2018-01-05 DIAGNOSIS — E1165 Type 2 diabetes mellitus with hyperglycemia: Secondary | ICD-10-CM | POA: Insufficient documentation

## 2018-01-05 DIAGNOSIS — Z9049 Acquired absence of other specified parts of digestive tract: Secondary | ICD-10-CM | POA: Insufficient documentation

## 2018-01-05 DIAGNOSIS — Z793 Long term (current) use of hormonal contraceptives: Secondary | ICD-10-CM | POA: Insufficient documentation

## 2018-01-05 DIAGNOSIS — I10 Essential (primary) hypertension: Secondary | ICD-10-CM

## 2018-01-05 DIAGNOSIS — Z882 Allergy status to sulfonamides status: Secondary | ICD-10-CM | POA: Insufficient documentation

## 2018-01-05 DIAGNOSIS — B373 Candidiasis of vulva and vagina: Secondary | ICD-10-CM | POA: Insufficient documentation

## 2018-01-05 DIAGNOSIS — Z79899 Other long term (current) drug therapy: Secondary | ICD-10-CM | POA: Insufficient documentation

## 2018-01-05 DIAGNOSIS — E785 Hyperlipidemia, unspecified: Secondary | ICD-10-CM | POA: Insufficient documentation

## 2018-01-05 DIAGNOSIS — B3731 Acute candidiasis of vulva and vagina: Secondary | ICD-10-CM

## 2018-01-05 LAB — GLUCOSE, POCT (MANUAL RESULT ENTRY): POC Glucose: 306 mg/dl — AB (ref 70–99)

## 2018-01-05 MED ORDER — GLIPIZIDE 10 MG PO TABS: 10.0000 mg | ORAL_TABLET | Freq: Two times a day (BID) | ORAL | 6 refills | Status: DC

## 2018-01-05 MED ORDER — FLUCONAZOLE 150 MG PO TABS: 150.0000 mg | ORAL_TABLET | Freq: Every day | ORAL | 0 refills | Status: DC

## 2018-01-05 MED ORDER — LIRAGLUTIDE 18 MG/3ML ~~LOC~~ SOPN: 1.8000 mg | PEN_INJECTOR | Freq: Every day | SUBCUTANEOUS | 3 refills | Status: DC

## 2018-01-05 NOTE — Progress Notes (Signed)
Subjective:  Patient ID: Lynn Anderson, female    DOB: November 12, 1980  Age: 37 y.o. MRN: [id number]  CC: Diabetes   HPI [name]   is a 37 year old female with history of type 2 diabetes mellitus (A1c 11.0), hypertension, hyperlipidemia here for a follow-up visit. Previously unable to tolerate metformin due to diarrhea.  Victoza was initiated at her last office visit and glipizide discontinued however her fasting sugar has ranged between 180 and 250 and it is 306 today. She denies hypoglycemic episodes. Lisinopril had been switched to losartan due to concerns of angioedema which has been experiencing family members but she feels losartan gives her headache but states she would like to remain on it given its renal protective effect. She has had recurrent vaginal candidiasis and is requesting a refill of her Diflucan which was previously prescribed by her GYN. She will be transferring care to [facility] due to insurance issues.  Past Medical History:  Diagnosis Date  . Diabetes mellitus without complication ([place])   . Hyperlipidemia   . Hypertension     Past Surgical History:  Procedure Laterality Date  . CHOLECYSTECTOMY    . Mirena      Inserted 10-16-14    Allergies  Allergen Reactions  . Metformin And Related Diarrhea      Outpatient Medications Prior to Visit  Medication Sig Dispense Refill  . Blood Glucose Monitoring Suppl (ONE TOUCH ULTRA 2) w/Device KIT 1 each by Does not apply route 3 (three) times daily. 1 each 0  . glucose blood (ONETOUCH VERIO) test strip Use to test blood sugar 2 times daily as instructed 100 each 2  . ibuprofen (ADVIL,MOTRIN) 200 MG tablet Take 200 mg by mouth every 6 (six) hours as needed.    . Insulin Pen Needle (TRUEPLUS PEN NEEDLES) 32G X 4 MM MISC Use as directed once daily to administer victoza 100 each 3  . Lancets (ONETOUCH ULTRASOFT) lancets Use as instructed 100 each 12  . levonorgestrel (MIRENA) 20 MCG/24HR IUD  1 each by Intrauterine route once.    Marland Kitchen losartan (COZAAR) 100 MG tablet Take 1 tablet (100 mg total) by mouth daily. 30 tablet 6  . ONETOUCH DELICA LANCETS FINE MISC Use to test blood sugar 2 times daily as instructed. 100 each 2  . simvastatin (ZOCOR) 40 MG tablet Take 1 tablet (40 mg total) by mouth at bedtime. 30 tablet 6  . liraglutide (VICTOZA) 18 MG/3ML SOPN Inject 0.3 mLs (1.8 mg total) into the skin daily. 9 mL 2  . fluconazole (DIFLUCAN) 150 MG tablet Take 1 tablet (150 mg total) by mouth daily. For 3 days then one tablet weekly for 3 month (Patient not taking: Reported on 01/05/2018) 15 tablet 0   No facility-administered medications prior to visit.     ROS Review of Systems  Constitutional: Negative for activity change, appetite change and fatigue.  HENT: Negative for congestion, sinus pressure and sore throat.   Eyes: Negative for visual disturbance.  Respiratory: Negative for cough, chest tightness, shortness of breath and wheezing.   Cardiovascular: Negative for chest pain and palpitations.  Gastrointestinal: Negative for abdominal distention, abdominal pain and constipation.  Endocrine: Negative for polydipsia.  Genitourinary: Negative for dysuria and frequency.  Musculoskeletal: Negative for arthralgias and back pain.  Skin: Negative for rash.  Neurological: Positive for headaches. Negative for tremors, light-headedness and numbness.  Hematological: Does not bruise/bleed easily.  Psychiatric/Behavioral: Negative for agitation and behavioral problems.    Objective:  BP (!) 142/95   Pulse (!) 101   Temp 98.1 F (36.7 C) (Oral)   Ht _0  (1.702 m)   Wt 283 lb 6.4 oz (128.5 kg)   SpO2 98%   BMI 44.39 kg/m   BP/Weight 01/05/2018 12/21/2017 34/28/7681  Systolic BP 157 262 035  Diastolic BP 95 94 95  Wt. (Lbs) 283.4 288 289.6  BMI 44.39 45.11 45.36      Physical Exam  Constitutional: She is oriented to person, place, and time. She appears well-developed and  well-nourished.  Cardiovascular: Normal rate, normal heart sounds and intact distal pulses.  No murmur heard. Pulmonary/Chest: Effort normal and breath sounds normal. She has no wheezes. She has no rales. She exhibits no tenderness.  Abdominal: Soft. Bowel sounds are normal. She exhibits no distension and no mass. There is no tenderness.  Musculoskeletal: Normal range of motion.  Neurological: She is alert and oriented to person, place, and time.  Psychiatric: She has a normal mood and affect.    CMP Latest Ref Rng & Units 11/26/2017 07/21/2017 10/07/2016  Glucose 65 - 99 mg/dL 216(H) 278(H) 252(H)  BUN 6 - 20 mg/dL _1 Creatinine 0.57 - 1.00 mg/dL 0.81 0.72 0.78  Sodium 134 - 144 mmol/L 136 135 136  Potassium 3.5 - 5.2 mmol/L 4.1 3.9 4.1  Chloride 96 - 106 mmol/L 99 104 101  CO2 20 - 29 mmol/L 20 21(L) 18(L)  Calcium 8.7 - 10.2 mg/dL 9.2 8.2(L) 9.1  Total Protein 6.0 - 8.5 g/dL 6.7 6.7 7.1  Total Bilirubin 0.0 - 1.2 mg/dL 0.5 0.7 0.5  Alkaline Phos 39 - 117 IU/L 63 63 68  AST 0 - 40 IU/L 12 42(H) 27  ALT 0 - 32 IU/L 18 40 32(H)    Lipid Panel     Component Value Date/Time   CHOL 214 (H) 11/26/2017 0842   TRIG 223 (H) 11/26/2017 0842   HDL 44 11/26/2017 0842   CHOLHDL 4.9 (H) 11/26/2017 0842   CHOLHDL 5.4 (H) 10/07/2016 1131   VLDL 55 (H) 10/07/2016 1131   LDLCALC 125 (H) 11/26/2017 0842    Lab Results  Component Value Date   HGBA1C 11.0 (A) 11/22/2017    Assessment & Plan:   1. Type 2 diabetes mellitus with hyperglycemia, without long-term current use of insulin (HCC) Uncontrolled with A1c of 11.0 Glipizide added back to regimen and educated about management of hypoglycemia.  She knows to cut back on glipizide to 5 mg tablet if this occurs. Compliant with diabetic diet, lifestyle modifications - POCT glucose (manual entry) - glipiZIDE (GLUCOTROL) 10 MG tablet; Take 1 tablet (10 mg total) by mouth 2 (two) times daily before a meal.  Dispense: 60 tablet; Refill: 6 -  liraglutide (VICTOZA) 18 MG/3ML SOPN; Inject 0.3 mLs (1.8 mg total) into the skin daily.  Dispense: 9 mL; Refill: 3  2. Vaginal candidiasis Refilled Diflucan  3. Essential hypertension Slightly elevated above goal Discussed renal protective effect of ACE inhibitors and ARB and despite no allergic reaction to lisinopril she would like to stay off it due to family history of allergic reactions. She has opted to remain on losartan despite her headaches and I have discussed with her other available ARB used which could be tried but might have a higher co-pay.   Meds ordered this encounter  Medications  . glipiZIDE (GLUCOTROL) 10 MG tablet    Sig: Take 1 tablet (10 mg total) by mouth 2 (two) times daily before a  meal.    Dispense:  60 tablet    Refill:  6  . fluconazole (DIFLUCAN) 150 MG tablet    Sig: Take 1 tablet (150 mg total) by mouth daily. For 3 days then one tablet weekly for 3 month    Dispense:  15 tablet    Refill:  0  . liraglutide (VICTOZA) 18 MG/3ML SOPN    Sig: Inject 0.3 mLs (1.8 mg total) into the skin daily.    Dispense:  9 mL    Refill:  3    Follow-up: Return for Follow-up of chronic medical conditions, patient will be reestablishing care with another PCP due to. insurance  Charlott Rakes MD

## 2018-02-22 ENCOUNTER — Ambulatory Visit: Payer: BLUE CROSS/BLUE SHIELD | Admitting: Family Medicine

## 2018-05-17 ENCOUNTER — Other Ambulatory Visit: Payer: Self-pay | Admitting: Family Medicine

## 2018-06-15 ENCOUNTER — Other Ambulatory Visit: Payer: Self-pay | Admitting: Family Medicine

## 2018-06-27 ENCOUNTER — Other Ambulatory Visit: Payer: Self-pay | Admitting: Family Medicine

## 2018-06-27 DIAGNOSIS — E1165 Type 2 diabetes mellitus with hyperglycemia: Secondary | ICD-10-CM

## 2018-07-27 ENCOUNTER — Other Ambulatory Visit: Payer: Self-pay | Admitting: Family Medicine

## 2018-07-27 DIAGNOSIS — E1165 Type 2 diabetes mellitus with hyperglycemia: Secondary | ICD-10-CM

## 2018-09-30 ENCOUNTER — Other Ambulatory Visit: Payer: Self-pay | Admitting: Family Medicine

## 2018-09-30 DIAGNOSIS — E1165 Type 2 diabetes mellitus with hyperglycemia: Secondary | ICD-10-CM

## 2018-10-18 ENCOUNTER — Other Ambulatory Visit: Payer: Self-pay

## 2018-10-19 ENCOUNTER — Encounter: Payer: Self-pay | Admitting: Women's Health

## 2018-10-19 ENCOUNTER — Ambulatory Visit: Payer: BLUE CROSS/BLUE SHIELD | Admitting: Women's Health

## 2018-10-19 VITALS — BP 128/80 | Ht 67.0 in | Wt 285.0 lb

## 2018-10-19 DIAGNOSIS — Z01419 Encounter for gynecological examination (general) (routine) without abnormal findings: Secondary | ICD-10-CM | POA: Diagnosis not present

## 2018-10-19 DIAGNOSIS — B373 Candidiasis of vulva and vagina: Secondary | ICD-10-CM | POA: Diagnosis not present

## 2018-10-19 DIAGNOSIS — B3731 Acute candidiasis of vulva and vagina: Secondary | ICD-10-CM

## 2018-10-19 LAB — WET PREP FOR TRICH, YEAST, CLUE

## 2018-10-19 MED ORDER — FLUCONAZOLE 150 MG PO TABS: 150.0000 mg | ORAL_TABLET | Freq: Every day | ORAL | 0 refills | Status: DC

## 2018-10-19 NOTE — Progress Notes (Signed)
Lynn Anderson Mar 17, 1980 [id number]    History:    Presents for annual exam.  10/2014 Mirena IUD spotting every 2 to 3 months.  Same partner.  Normal Pap history.  Has not had a screening mammogram.  Primary care manages hypertension, diabetes and hypercholesteremia.  Past medical history, past surgical history, family history and social history were all reviewed and documented in the EPIC chart.  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  2 daughters ages 61 and 73 both  had Gardasil.  Mother hypertension and diabetes, father heart disease.  ROS:  A ROS was performed and pertinent positives and negatives are included.  Exam:  Vitals:   10/19/18 1112  BP: 128/80  Weight: 285 lb (129.3 kg)  Height: 5\' 7"  (1.702 m)   Body mass index is 44.64 kg/m.   General appearance:  Normal Thyroid:  Symmetrical, normal in size, without palpable masses or nodularity. Respiratory  Auscultation:  Clear without wheezing or rhonchi Cardiovascular  Auscultation:  Regular rate, without rubs, murmurs or gallops  Edema/varicosities:  Not grossly evident Abdominal  Soft,nontender, without masses, guarding or rebound.  Liver/spleen:  No organomegaly noted  Hernia:  None appreciated  Skin  Inspection:  Grossly normal   Breasts: Examined lying and sitting.     Right: Without masses, retractions, discharge or axillary adenopathy.     Left: Without masses, retractions, discharge or axillary adenopathy. Gentitourinary   Inguinal/mons:  Normal without inguinal adenopathy  External genitalia:  Normal  BUS/Urethra/Skene's glands:  Normal  Vagina:  Normal  Cervix:  Normal IUD strings visible  Uterus:   normal in size, shape and contour.  Midline and mobile  Adnexa/parametria:     Rt: Without masses or tenderness.   Lt: Without masses or tenderness.  Anus and perineum: Normal  Digital rectal exam: Normal sphincter tone without palpated masses or tenderness  Assessment/Plan:  38 y.o. SBF G2 P2  for annual exam with occasional vaginal itching.  10/2014 Mirena IUD spotting every 2 to 3 months Occasional yeast vaginitis Hypertension, diabetes, hypercholesteremia-primary care manages labs and meds Morbid obesity  Plan: Wet prep negative, states she uses an occasional Diflucan 150 at onset of vaginal itching and has had rare yeast infections this past year.  Refill of Diflucan 150 p.o. x1 dose given.  SBEs, start annual screening mammograms, breast center information given instructed to schedule.  Reviewed importance of regular exercise, decreasing calorie/carbs, diabetic diet discussed.  Aware Mirena IUD is good for 5 years, options reviewed plans to have removed and replaced next year.  Pap normal 2019, new screening guidelines reviewed.    Uncertain, 11:22 AM 10/19/2018

## 2018-10-19 NOTE — Patient Instructions (Addendum)
Breast center  [phone]  [address] [address] [facility] [address]  Good seeing you today!  Carbohydrate Counting for Diabetes Mellitus, Adult  Carbohydrate counting is a method of keeping track of how many carbohydrates you eat. Eating carbohydrates naturally increases the amount of sugar (glucose) in the blood. Counting how many carbohydrates you eat helps keep your blood glucose within normal limits, which helps you manage your diabetes (diabetes mellitus). It is important to know how many carbohydrates you can safely have in each meal. This is different for every person. A diet and nutrition specialist (registered dietitian) can help you make a meal plan and calculate how many carbohydrates you should have at each meal and snack. Carbohydrates are found in the following foods:  Grains, such as breads and cereals.  Dried beans and soy products.  Starchy vegetables, such as potatoes, peas, and corn.  Fruit and fruit juices.  Milk and yogurt.  Sweets and snack foods, such as cake, cookies, candy, chips, and soft drinks. How do I count carbohydrates? There are two ways to count carbohydrates in food. You can use either of the methods or a combination of both. Reading "Nutrition Facts" on packaged food The "Nutrition Facts" list is included on the labels of almost all packaged foods and beverages in the [place] It includes:  The serving size.  Information about nutrients in each serving, including the grams (g) of carbohydrate per serving. To use the "Nutrition Facts":  Decide how many servings you will have.  Multiply the number of servings by the number of carbohydrates per serving.  The resulting number is the total amount of carbohydrates that you will be having. Learning standard serving sizes of other foods When you eat carbohydrate foods that are not packaged or do not include "Nutrition Facts" on the label, you need to measure the servings in order to count the amount of carbohydrates:   Measure the foods that you will eat with a food scale or measuring cup, if needed.  Decide how many standard-size servings you will eat.  Multiply the number of servings by 15. Most carbohydrate-rich foods have about 15 g of carbohydrates per serving. ? For example, if you eat 8 oz (170 g) of strawberries, you will have eaten 2 servings and 30 g of carbohydrates (2 servings x 15 g = 30 g).  For foods that have more than one food mixed, such as soups and casseroles, you must count the carbohydrates in each food that is included. The following list contains standard serving sizes of common carbohydrate-rich foods. Each of these servings has about 15 g of carbohydrates:   hamburger bun or  English muffin.   oz (15 mL) syrup.   oz (14 g) jelly.  1 slice of bread.  1 six-inch tortilla.  3 oz (85 g) cooked rice or pasta.  4 oz (113 g) cooked dried beans.  4 oz (113 g) starchy vegetable, such as peas, corn, or potatoes.  4 oz (113 g) hot cereal.  4 oz (113 g) mashed potatoes or  of a large baked potato.  4 oz (113 g) canned or frozen fruit.  4 oz (120 mL) fruit juice.  4-6 crackers.  6 chicken nuggets.  6 oz (170 g) unsweetened dry cereal.  6 oz (170 g) plain fat-free yogurt or yogurt sweetened with artificial sweeteners.  8 oz (240 mL) milk.  8 oz (170 g) fresh fruit or one small piece of fruit.  24 oz (680 g) popped popcorn. Example of  carbohydrate counting Sample meal  3 oz (85 g) chicken breast.  6 oz (170 g) brown rice.  4 oz (113 g) corn.  8 oz (240 mL) milk.  8 oz (170 g) strawberries with sugar-free whipped topping. Carbohydrate calculation 1. Identify the foods that contain carbohydrates: ? Rice. ? Corn. ? Milk. ? Strawberries. 2. Calculate how many servings you have of each food: ? 2 servings rice. ? 1 serving corn. ? 1 serving milk. ? 1 serving strawberries. 3. Multiply each number of servings by 15 g: ? 2 servings rice x 15 g = 30 g. ?  1 serving corn x 15 g = 15 g. ? 1 serving milk x 15 g = 15 g. ? 1 serving strawberries x 15 g = 15 g. 4. Add together all of the amounts to find the total grams of carbohydrates eaten: ? 30 g + 15 g + 15 g + 15 g = 75 g of carbohydrates total. Summary  Carbohydrate counting is a method of keeping track of how many carbohydrates you eat.  Eating carbohydrates naturally increases the amount of sugar (glucose) in the blood.  Counting how many carbohydrates you eat helps keep your blood glucose within normal limits, which helps you manage your diabetes.  A diet and nutrition specialist (registered dietitian) can help you make a meal plan and calculate how many carbohydrates you should have at each meal and snack. This information is not intended to replace advice given to you by your health care provider. Make sure you discuss any questions you have with your health care provider. Document Released: 01/26/2005 Document Revised: 08/20/2016 Document Reviewed: 07/10/2015 Elsevier Patient Education  2020 [place] Maintenance, Female Adopting a healthy lifestyle and getting preventive care are important in promoting health and wellness. Ask your health care provider about:  The right schedule for you to have regular tests and exams.  Things you can do on your own to prevent diseases and keep yourself healthy. What should I know about diet, weight, and exercise? Eat a healthy diet   Eat a diet that includes plenty of vegetables, fruits, low-fat dairy products, and lean protein.  Do not eat a lot of foods that are high in solid fats, added sugars, or sodium. Maintain a healthy weight Body mass index (BMI) is used to identify weight problems. It estimates body fat based on height and weight. Your health care provider can help determine your BMI and help you achieve or maintain a healthy weight. Get regular exercise Get regular exercise. This is one of the most important things  you can do for your health. Most adults should:  Exercise for at least 150 minutes each week. The exercise should increase your heart rate and make you sweat (moderate-intensity exercise).  Do strengthening exercises at least twice a week. This is in addition to the moderate-intensity exercise.  Spend less time sitting. Even light physical activity can be beneficial. Watch cholesterol and blood lipids Have your blood tested for lipids and cholesterol at 40 years of age, then have this test every 5 years. Have your cholesterol levels checked more often if:  Your lipid or cholesterol levels are high.  You are older than 40 years of age.  You are at high risk for heart disease. What should I know about cancer screening? Depending on your health history and family history, you may need to have cancer screening at various ages. This may include screening for:  Breast cancer.  Cervical  cancer.  Colorectal cancer.  Skin cancer.  Lung cancer. What should I know about heart disease, diabetes, and high blood pressure? Blood pressure and heart disease  High blood pressure causes heart disease and increases the risk of stroke. This is more likely to develop in people who have high blood pressure readings, are of African descent, or are overweight.  Have your blood pressure checked: ? Every 3-5 years if you are 6518-40 years of age. ? Every year if you are 40 years old or older. Diabetes Have regular diabetes screenings. This checks your fasting blood sugar level. Have the screening done:  Once every three years after age 40 if you are at a normal weight and have a low risk for diabetes.  More often and at a younger age if you are overweight or have a high risk for diabetes. What should I know about preventing infection? Hepatitis B If you have a higher risk for hepatitis B, you should be screened for this virus. Talk with your health care provider to find out if you are at risk for  hepatitis B infection. Hepatitis C Testing is recommended for:  Everyone born from [birth year] through 1967.  Anyone with known risk factors for hepatitis C. Sexually transmitted infections (STIs)  Get screened for STIs, including gonorrhea and chlamydia, if: ? You are sexually active and are younger than 40 years of age. ? You are older than 40 years of age and your health care provider tells you that you are at risk for this type of infection. ? Your sexual activity has changed since you were last screened, and you are at increased risk for chlamydia or gonorrhea. Ask your health care provider if you are at risk.  Ask your health care provider about whether you are at high risk for HIV. Your health care provider may recommend a prescription medicine to help prevent HIV infection. If you choose to take medicine to prevent HIV, you should first get tested for HIV. You should then be tested every 3 months for as long as you are taking the medicine. Pregnancy  If you are about to stop having your period (premenopausal) and you may become pregnant, seek counseling before you get pregnant.  Take 400 to 800 micrograms (mcg) of folic acid every day if you become pregnant.  Ask for birth control (contraception) if you want to prevent pregnancy. Osteoporosis and menopause Osteoporosis is a disease in which the bones lose minerals and strength with aging. This can result in bone fractures. If you are 40 years old or older, or if you are at risk for osteoporosis and fractures, ask your health care provider if you should:  Be screened for bone loss.  Take a calcium or vitamin D supplement to lower your risk of fractures.  Be given hormone replacement therapy (HRT) to treat symptoms of menopause. Follow these instructions at home: Lifestyle  Do not use any products that contain nicotine or tobacco, such as cigarettes, e-cigarettes, and chewing tobacco. If you need help quitting, ask your health care  provider.  Do not use street drugs.  Do not share needles.  Ask your health care provider for help if you need support or information about quitting drugs. Alcohol use  Do not drink alcohol if: ? Your health care provider tells you not to drink. ? You are pregnant, may be pregnant, or are planning to become pregnant.  If you drink alcohol: ? Limit how much you use to 0-1 drink a day. ?  Limit intake if you are breastfeeding.  Be aware of how much alcohol is in your drink. In the [place] one drink equals one 12 oz bottle of beer (355 mL), one 5 oz glass of wine (148 mL), or one 1 oz glass of hard liquor (44 mL). General instructions  Schedule regular health, dental, and eye exams.  Stay current with your vaccines.  Tell your health care provider if: ? You often feel depressed. ? You have ever been abused or do not feel safe at home. Summary  Adopting a healthy lifestyle and getting preventive care are important in promoting health and wellness.  Follow your health care provider's instructions about healthy diet, exercising, and getting tested or screened for diseases.  Follow your health care provider's instructions on monitoring your cholesterol and blood pressure. This information is not intended to replace advice given to you by your health care provider. Make sure you discuss any questions you have with your health care provider. Document Released: 08/11/2010 Document Revised: 01/19/2018 Document Reviewed: 01/19/2018 Elsevier Patient Education  2020 [employer].

## 2018-10-28 ENCOUNTER — Other Ambulatory Visit: Payer: Self-pay

## 2018-10-28 ENCOUNTER — Emergency Department (HOSPITAL_COMMUNITY)
Admission: EM | Admit: 2018-10-28 | Discharge: 2018-10-28 | Disposition: A | Discharge status: Home / Self Care | Payer: BLUE CROSS/BLUE SHIELD | Attending: Emergency Medicine | Admitting: Emergency Medicine

## 2018-10-28 ENCOUNTER — Encounter (HOSPITAL_COMMUNITY): Payer: Self-pay | Admitting: Emergency Medicine

## 2018-10-28 DIAGNOSIS — Z79899 Other long term (current) drug therapy: Secondary | ICD-10-CM | POA: Diagnosis not present

## 2018-10-28 DIAGNOSIS — S39012A Strain of muscle, fascia and tendon of lower back, initial encounter: Secondary | ICD-10-CM | POA: Insufficient documentation

## 2018-10-28 DIAGNOSIS — Y92410 Unspecified street and highway as the place of occurrence of the external cause: Secondary | ICD-10-CM | POA: Diagnosis not present

## 2018-10-28 DIAGNOSIS — E119 Type 2 diabetes mellitus without complications: Secondary | ICD-10-CM | POA: Diagnosis not present

## 2018-10-28 DIAGNOSIS — Y999 Unspecified external cause status: Secondary | ICD-10-CM | POA: Insufficient documentation

## 2018-10-28 DIAGNOSIS — Z794 Long term (current) use of insulin: Secondary | ICD-10-CM | POA: Diagnosis not present

## 2018-10-28 DIAGNOSIS — S3992XA Unspecified injury of lower back, initial encounter: Secondary | ICD-10-CM | POA: Diagnosis not present

## 2018-10-28 DIAGNOSIS — I10 Essential (primary) hypertension: Secondary | ICD-10-CM | POA: Insufficient documentation

## 2018-10-28 DIAGNOSIS — Y9389 Activity, other specified: Secondary | ICD-10-CM | POA: Diagnosis not present

## 2018-10-28 MED ORDER — CYCLOBENZAPRINE HCL 10 MG PO TABS: 10.0000 mg | ORAL_TABLET | Freq: Two times a day (BID) | ORAL | 0 refills | Status: DC | PRN

## 2018-10-28 MED ORDER — IBUPROFEN 800 MG PO TABS: 800.0000 mg | ORAL_TABLET | Freq: Three times a day (TID) | ORAL | 0 refills | Status: DC
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
## 2018-11-01 ENCOUNTER — Encounter: Payer: Self-pay | Admitting: Gynecology

## 2018-11-09 ENCOUNTER — Encounter: Payer: Self-pay | Admitting: Family Medicine

## 2018-11-09 ENCOUNTER — Ambulatory Visit: Payer: BLUE CROSS/BLUE SHIELD | Attending: Family Medicine | Admitting: Family Medicine

## 2018-11-09 DIAGNOSIS — M545 Low back pain, unspecified: Secondary | ICD-10-CM

## 2018-11-09 DIAGNOSIS — E1165 Type 2 diabetes mellitus with hyperglycemia: Secondary | ICD-10-CM | POA: Diagnosis not present

## 2018-11-09 DIAGNOSIS — I10 Essential (primary) hypertension: Secondary | ICD-10-CM

## 2018-11-09 MED ORDER — TRUEPLUS PEN NEEDLES 32G X 4 MM MISC: 3 refills | Status: DC

## 2018-11-09 MED ORDER — LOSARTAN POTASSIUM 100 MG PO TABS: 100.0000 mg | ORAL_TABLET | Freq: Every day | ORAL | 6 refills | Status: DC

## 2018-11-09 MED ORDER — GLIPIZIDE 10 MG PO TABS: 10.0000 mg | ORAL_TABLET | Freq: Two times a day (BID) | ORAL | 6 refills | Status: DC

## 2018-11-09 MED ORDER — SIMVASTATIN 40 MG PO TABS: 40.0000 mg | ORAL_TABLET | Freq: Every day | ORAL | 6 refills | Status: DC

## 2018-11-09 MED ORDER — VICTOZA 18 MG/3ML ~~LOC~~ SOPN: 1.8000 mg | PEN_INJECTOR | Freq: Every day | SUBCUTANEOUS | 6 refills | Status: DC
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
## 2019-01-25 ENCOUNTER — Other Ambulatory Visit: Payer: Self-pay | Admitting: Women's Health

## 2019-01-26 NOTE — Telephone Encounter (Signed)
Ok for diflucan 150 mg one dose if no relief OV

## 2019-01-26 NOTE — Telephone Encounter (Signed)
Rx sent for 1 tablet.

## 2019-01-26 NOTE — Telephone Encounter (Signed)
Per note on 10/19/18 "Occasional yeast vaginitis"

## 2019-01-30 ENCOUNTER — Telehealth: Payer: Self-pay | Admitting: *Deleted

## 2019-01-30 NOTE — Telephone Encounter (Signed)
Please call and review okay for Diflucan 150 p.o. today repeat in 3 days if needed, and weekly if needed, #10 only. please review this is detoxified in the liver so can cause liver damage if too much Diflucan is taken.  She is diabetic, ask if her blood sugars have been good, avoid too many carbs.

## 2019-01-30 NOTE — Telephone Encounter (Signed)
Patient has pharmacy requesting refill on diflucan 150 mg tablet #15 on 01/27/19, you only approved 1 tablet. Patient said she takes 1 po daily x 3 days then 1 po daily as needed for recurrent yeast. Please advise

## 2019-01-31 MED ORDER — FLUCONAZOLE 150 MG PO TABS: ORAL_TABLET | ORAL | 0 refills | Status: DC

## 2019-01-31 NOTE — Telephone Encounter (Signed)
Left detailed message on cell per DPR access. Rx sent.  

## 2019-04-19 ENCOUNTER — Telehealth: Payer: Self-pay | Admitting: Family Medicine

## 2019-04-19 DIAGNOSIS — E1165 Type 2 diabetes mellitus with hyperglycemia: Secondary | ICD-10-CM

## 2019-04-19 MED ORDER — VICTOZA 18 MG/3ML ~~LOC~~ SOPN: 1.8000 mg | PEN_INJECTOR | Freq: Every day | SUBCUTANEOUS | 0 refills | Status: DC

## 2019-04-19 NOTE — Telephone Encounter (Signed)
Medication has been refilled for 30 days patient must keep upcoming appointment.

## 2019-04-19 NOTE — Telephone Encounter (Signed)
Patient called and requested for listed medication to be refilled.   Victoza

## 2019-05-04 ENCOUNTER — Ambulatory Visit: Payer: Self-pay | Attending: Family Medicine | Admitting: Family Medicine

## 2019-05-04 ENCOUNTER — Other Ambulatory Visit: Payer: Self-pay

## 2019-05-04 ENCOUNTER — Encounter: Payer: Self-pay | Admitting: Family Medicine

## 2019-05-04 DIAGNOSIS — E785 Hyperlipidemia, unspecified: Secondary | ICD-10-CM

## 2019-05-04 DIAGNOSIS — B3731 Acute candidiasis of vulva and vagina: Secondary | ICD-10-CM

## 2019-05-04 DIAGNOSIS — I10 Essential (primary) hypertension: Secondary | ICD-10-CM

## 2019-05-04 DIAGNOSIS — E1165 Type 2 diabetes mellitus with hyperglycemia: Secondary | ICD-10-CM

## 2019-05-04 DIAGNOSIS — E1169 Type 2 diabetes mellitus with other specified complication: Secondary | ICD-10-CM

## 2019-05-04 DIAGNOSIS — B373 Candidiasis of vulva and vagina: Secondary | ICD-10-CM

## 2019-05-04 MED ORDER — VICTOZA 18 MG/3ML ~~LOC~~ SOPN: 1.8000 mg | PEN_INJECTOR | Freq: Every day | SUBCUTANEOUS | 6 refills | Status: DC

## 2019-05-04 MED ORDER — FLUCONAZOLE 150 MG PO TABS: ORAL_TABLET | ORAL | 0 refills | Status: DC

## 2019-05-04 MED ORDER — SIMVASTATIN 40 MG PO TABS: 40.0000 mg | ORAL_TABLET | Freq: Every day | ORAL | 1 refills | Status: DC

## 2019-05-04 MED ORDER — LOSARTAN POTASSIUM 100 MG PO TABS: 100.0000 mg | ORAL_TABLET | Freq: Every day | ORAL | 1 refills | Status: DC

## 2019-05-04 MED ORDER — GLIPIZIDE 10 MG PO TABS: 20.0000 mg | ORAL_TABLET | Freq: Two times a day (BID) | ORAL | 1 refills | Status: DC

## 2019-05-04 NOTE — Progress Notes (Signed)
Virtual Visit via Telephone Note  I connected with Lynn Anderson, on 05/04/2019 at 11:01 AM by telephone due to the COVID-19 pandemic and verified that I am speaking with the correct person using two identifiers.   Consent: I discussed the limitations, risks, security and privacy concerns of performing an evaluation and management service by telephone and the availability of in person appointments. I also discussed with the patient that there may be a patient responsible charge related to this service. The patient expressed understanding and agreed to proceed.   Location of Patient: Home  Location of Provider: Clinic   Persons participating in Telemedicine visit: Lynn Anderson Dr. Margarita Rana     History of Present Illness: 39 year old female with a history of type 2 diabetes mellitus (A1c 11.0), hypertension, hyperlipidemia here for follow-up visit. She has been out of Victoza for 11 days as her insurance requires her to fill it at [facility] but this took a while to arrive in the mail and her blood sugars were elevated but now are starting to improve and is now at 318, previously were in the 400s She is not compliant with a Diabetic diet; she drinks sweet tea. She has not been checking her blood pressures at home. Today she requests a refill of Diflucan which she has had to take chronically over 3 months in the past due to recurrent vaginal candidiasis. Past Medical History:  Diagnosis Date  . Diabetes mellitus without complication ([place])   . Hyperlipidemia   . Hypertension    Allergies  Allergen Reactions  . Metformin And Related Diarrhea    Current Outpatient Medications on File Prior to Visit  Medication Sig Dispense Refill  . Blood Glucose Monitoring Suppl (ONE TOUCH ULTRA 2) w/Device KIT 1 each by Does not apply route 3 (three) times daily. 1 each 0  . cyclobenzaprine (FLEXERIL) 10 MG tablet Take 1 tablet (10 mg total) by mouth 2 (two) times  daily as needed for muscle spasms. 20 tablet 0  . glipiZIDE (GLUCOTROL) 10 MG tablet Take 1 tablet (10 mg total) by mouth 2 (two) times daily before a meal. 60 tablet 6  . glucose blood (ONETOUCH VERIO) test strip Use to test blood sugar 2 times daily as instructed 100 each 2  . Insulin Pen Needle (TRUEPLUS PEN NEEDLES) 32G X 4 MM MISC Use as directed once daily to administer victoza 100 each 3  . Lancets (ONETOUCH ULTRASOFT) lancets Use as instructed 100 each 12  . levonorgestrel (MIRENA) 20 MCG/24HR IUD 1 each by Intrauterine route once.    . liraglutide (VICTOZA) 18 MG/3ML SOPN Inject 0.3 mLs (1.8 mg total) into the skin daily. 9 mL 0  . losartan (COZAAR) 100 MG tablet Take 1 tablet (100 mg total) by mouth daily. 30 tablet 6  . ONETOUCH DELICA LANCETS FINE MISC Use to test blood sugar 2 times daily as instructed. 100 each 2  . simvastatin (ZOCOR) 40 MG tablet Take 1 tablet (40 mg total) by mouth daily at 6 PM. 30 tablet 6  . fluconazole (DIFLUCAN) 150 MG tablet Take one tablet by mouth now and repeat in 3 days. Then 1 tablet weekly as needed. (Patient not taking: Reported on 05/04/2019) 10 tablet 0  . ibuprofen (ADVIL) 800 MG tablet Take 1 tablet (800 mg total) by mouth 3 (three) times daily. (Patient not taking: Reported on 11/09/2018) 21 tablet 0   No current facility-administered medications on file prior to visit.    Observations/Objective: Awake,  alert, oriented x3 Not in acute distress  CMP Latest Ref Rng & Units 11/26/2017 07/21/2017 10/07/2016  Glucose 65 - 99 mg/dL 216(H) 278(H) 252(H)  BUN 6 - 20 mg/dL '8 7 9  ' Creatinine 0.57 - 1.00 mg/dL 0.81 0.72 0.78  Sodium 134 - 144 mmol/L 136 135 136  Potassium 3.5 - 5.2 mmol/L 4.1 3.9 4.1  Chloride 96 - 106 mmol/L 99 104 101  CO2 20 - 29 mmol/L 20 21(L) 18(L)  Calcium 8.7 - 10.2 mg/dL 9.2 8.2(L) 9.1  Total Protein 6.0 - 8.5 g/dL 6.7 6.7 7.1  Total Bilirubin 0.0 - 1.2 mg/dL 0.5 0.7 0.5  Alkaline Phos 39 - 117 IU/L 63 63 68  AST 0 - 40  IU/L 12 42(H) 27  ALT 0 - 32 IU/L 18 40 32(H)    Lipid Panel     Component Value Date/Time   CHOL 214 (H) 11/26/2017 0842   TRIG 223 (H) 11/26/2017 0842   HDL 44 11/26/2017 0842   CHOLHDL 4.9 (H) 11/26/2017 0842   CHOLHDL 5.4 (H) 10/07/2016 1131   VLDL 55 (H) 10/07/2016 1131   LDLCALC 125 (H) 11/26/2017 0842   LABVLDL 45 (H) 11/26/2017 0842    Lab Results  Component Value Date   HGBA1C 11.0 (A) 11/22/2017    Assessment and Plan: 1. Type 2 diabetes mellitus with hyperglycemia, without long-term current use of insulin (HCC) Uncontrolled with A1c of 11.0; goal is less than 7 Poor glycemic control due to noncompliance with diabetic diet, running out of medications Will reviewed blood sugar log at next visit and adjust regimen accordingly Counseled on Diabetic diet, my plate method, 242 minutes of moderate intensity exercise/week Blood sugar logs with fasting goals of 80-120 mg/dl, random of less than 180 and in the event of sugars less than 60 mg/dl or greater than 400 mg/dl encouraged to notify the clinic. Advised on the need for annual eye exams, annual foot exams, Pneumonia vaccine. - liraglutide (VICTOZA) 18 MG/3ML SOPN; Inject 0.3 mLs (1.8 mg total) into the skin daily.  Dispense: 9 mL; Refill: 6 - simvastatin (ZOCOR) 40 MG tablet; Take 1 tablet (40 mg total) by mouth daily at 6 PM.  Dispense: 90 tablet; Refill: 1 - glipiZIDE (GLUCOTROL) 10 MG tablet; Take 2 tablets (20 mg total) by mouth 2 (two) times daily before a meal.  Dispense: 360 tablet; Refill: 1  2. Vaginal candidiasis - fluconazole (DIFLUCAN) 150 MG tablet; Take one tablet by mouth now and repeat in 3 days. Then 1 tablet weekly as needed.  Dispense: 15 tablet; Refill: 0  3. Essential hypertension She has not been checking her blood pressures We will check at next in person visit - losartan (COZAAR) 100 MG tablet; Take 1 tablet (100 mg total) by mouth daily.  Dispense: 90 tablet; Refill: 1  4. Hyperlipidemia  associated with type 2 diabetes mellitus ([place]) Uncontrolled Lipid panel at next visit Continue simvastatin   Follow Up Instructions: Return in about 1 month (around 06/04/2019) for Diabetes mellitus.    I discussed the assessment and treatment plan with the patient. The patient was provided an opportunity to ask questions and all were answered. The patient agreed with the plan and demonstrated an understanding of the instructions.   The patient was advised to call back or seek an in-person evaluation if the symptoms worsen or if the condition fails to improve as anticipated.     I provided 15 minutes total of non-face-to-face time during this encounter including median intraservice  time, reviewing previous notes, investigations, ordering medications, medical decision making, coordinating care and patient verbalized understanding at the end of the visit.     Charlott Rakes, MD, FAAFP. [facility] and [place], [place]   05/04/2019, 11:01 AM
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
## 2019-06-06 ENCOUNTER — Encounter: Payer: Self-pay | Admitting: Family Medicine

## 2019-06-06 ENCOUNTER — Ambulatory Visit: Payer: PRIVATE HEALTH INSURANCE | Attending: Family Medicine | Admitting: Family Medicine

## 2019-06-06 ENCOUNTER — Other Ambulatory Visit: Payer: Self-pay

## 2019-06-06 VITALS — BP 141/91 | HR 103 | Ht 67.0 in | Wt 283.6 lb

## 2019-06-06 DIAGNOSIS — E1165 Type 2 diabetes mellitus with hyperglycemia: Secondary | ICD-10-CM | POA: Diagnosis not present

## 2019-06-06 DIAGNOSIS — Z1159 Encounter for screening for other viral diseases: Secondary | ICD-10-CM

## 2019-06-06 DIAGNOSIS — I10 Essential (primary) hypertension: Secondary | ICD-10-CM | POA: Diagnosis not present

## 2019-06-06 LAB — POCT GLYCOSYLATED HEMOGLOBIN (HGB A1C): HbA1c, POC (controlled diabetic range): 11.7 % — AB (ref 0.0–7.0)

## 2019-06-06 LAB — GLUCOSE, POCT (MANUAL RESULT ENTRY): POC Glucose: 206 mg/dl — AB (ref 70–99)

## 2019-06-06 MED ORDER — LANTUS SOLOSTAR 100 UNIT/ML ~~LOC~~ SOPN: 12.0000 [IU] | PEN_INJECTOR | Freq: Every day | SUBCUTANEOUS | 3 refills | Status: DC

## 2019-06-06 MED ORDER — GLIPIZIDE 10 MG PO TABS: 20.0000 mg | ORAL_TABLET | Freq: Two times a day (BID) | ORAL | 1 refills | Status: DC

## 2019-06-06 MED ORDER — TRUEPLUS PEN NEEDLES 32G X 4 MM MISC: 3 refills | Status: DC

## 2019-06-06 NOTE — Progress Notes (Signed)
Takes BP medication at night.

## 2019-06-06 NOTE — Progress Notes (Signed)
Established Patient Office Visit  Subjective:  Patient ID: KALONI BISAILLON, female    DOB: January 24, 1981  Age: 39 y.o. MRN: [id number]  CC:  Chief Complaint  Patient presents with  . Diabetes    HPI KRYSTEN VERONICA is a 39 yo female with a medical history of DM 2 (A1c 11.7). Hyperlipidemia, Hypertension, and obesity that comes into today for her 1 month follow up for her chronic medical conditions.   Ms. Krikorian endorses compliance with all of her medications and has been checking and logging her blood sugars at home. She checks fasting and random sugars before meals and ranges between 190-280. On today's visit her A1c is elevated from 11.0 (11/2017) to 11.7. She reports that she is having trouble reducing carb heavy meals (like pasta, rice and bread), and still drinks sweet tea at least twice a week. She reports that she is taking walks at least 3x a week. Today, she is concerned about her glycemic control with her blood sugars still ranging in the 200s. 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 She does not report any low blood sugars or hypoglycemic symptoms. She does not report any vision changes, numbness or tingling. She recently had an eye exam in March 2021, and reports that her eye doctor is concerned about her A1c, but does not report any diabetic retinopathy.  She recently had her COVID-19 vaccine and did not have any adverse effects.   She is compliant with her statin therapy and does not report any myalgias.   She does not have any additional concerns for today's visit.   Past Medical History:  Diagnosis Date  . Diabetes mellitus without complication ([place])   . Hyperlipidemia   . Hypertension     Past Surgical History:  Procedure Laterality Date  . CHOLECYSTECTOMY    . Mirena      Inserted 10-16-14    Family History  Problem Relation Age of Onset  .  Hyperlipidemia Mother   . Hypertension Mother   . Diabetes Mother   . Diabetes Maternal Grandmother   . Heart disease Father   . Diabetes Maternal Aunt     Social History   Socioeconomic History  . Marital status: Single    Spouse name: Not on file  . Number of children: Not on file  . Years of education: Not on file  . Highest education level: Not on file  Occupational History  . Not on file  Tobacco Use  . Smoking status: Never Smoker  . Smokeless tobacco: Never Used  Substance and Sexual Activity  . Alcohol use: No    Alcohol/week: 0.0 standard drinks  . Drug use: No  . Sexual activity: Not Currently    Birth control/protection: I.U.D.    Comment: Mirena  inserted 10-16-14-INTERCOURSE AGE 43,MORE THAN 5  SEXUAL PARTNERS  Other Topics Concern  . Not on file  Social History Narrative  . Not on file   Social Determinants of Health   Financial Resource Strain:   . Difficulty of Paying Living Expenses:   Food Insecurity:   . Worried About Charity fundraiser in the Last Year:   . Arboriculturist in the Last Year:   Transportation Needs:   . Film/video editor (Medical):   Marland Kitchen Lack of Transportation (Non-Medical):   Physical Activity:   . Days of Exercise per Week:   .  Minutes of Exercise per Session:   Stress:   . Feeling of Stress :   Social Connections:   . Frequency of Communication with Friends and Family:   . Frequency of Social Gatherings with Friends and Family:   . Attends Religious Services:   . Active Member of Clubs or Organizations:   . Attends Archivist Meetings:   Marland Kitchen Marital Status:   Intimate Partner Violence:   . Fear of Current or Ex-Partner:   . Emotionally Abused:   Marland Kitchen Physically Abused:   . Sexually Abused:     Outpatient Medications Prior to Visit  Medication Sig Dispense Refill  . Blood Glucose Monitoring Suppl (ONE TOUCH ULTRA 2) w/Device KIT 1 each by Does not apply route 3 (three) times daily. 1 each 0  . cyclobenzaprine  (FLEXERIL) 10 MG tablet Take 1 tablet (10 mg total) by mouth 2 (two) times daily as needed for muscle spasms. 20 tablet 0  . fluconazole (DIFLUCAN) 150 MG tablet Take one tablet by mouth now and repeat in 3 days. Then 1 tablet weekly as needed. 15 tablet 0  . glucose blood (ONETOUCH VERIO) test strip Use to test blood sugar 2 times daily as instructed 100 each 2  . ibuprofen (ADVIL) 800 MG tablet Take 1 tablet (800 mg total) by mouth 3 (three) times daily. 21 tablet 0  . Lancets (ONETOUCH ULTRASOFT) lancets Use as instructed 100 each 12  . levonorgestrel (MIRENA) 20 MCG/24HR IUD 1 each by Intrauterine route once.    . liraglutide (VICTOZA) 18 MG/3ML SOPN Inject 0.3 mLs (1.8 mg total) into the skin daily. 9 mL 6  . losartan (COZAAR) 100 MG tablet Take 1 tablet (100 mg total) by mouth daily. 90 tablet 1  . ONETOUCH DELICA LANCETS FINE MISC Use to test blood sugar 2 times daily as instructed. 100 each 2  . simvastatin (ZOCOR) 40 MG tablet Take 1 tablet (40 mg total) by mouth daily at 6 PM. 90 tablet 1  . glipiZIDE (GLUCOTROL) 10 MG tablet Take 2 tablets (20 mg total) by mouth 2 (two) times daily before a meal. 360 tablet 1  . Insulin Pen Needle (TRUEPLUS PEN NEEDLES) 32G X 4 MM MISC Use as directed once daily to administer victoza 100 each 3   No facility-administered medications prior to visit.    Allergies  Allergen Reactions  . Metformin And Related Diarrhea    ROS Review of Systems  Constitutional: Negative for appetite change and unexpected weight change.  HENT: Negative.   Eyes: Negative for visual disturbance.  Respiratory: Negative for cough and shortness of breath.   Cardiovascular: Negative for chest pain, palpitations and leg swelling.  Gastrointestinal: Negative for abdominal pain, constipation, diarrhea, nausea and vomiting.  Musculoskeletal: Negative for arthralgias and myalgias.  Neurological: Negative for weakness, light-headedness and numbness.  Psychiatric/Behavioral:  Negative for dysphoric mood.      Objective:    Physical Exam  Constitutional: She is oriented to person, place, and time. She appears well-developed. She is cooperative.  Obese habitus  HENT:  Head: Normocephalic and atraumatic.  Right Ear: External ear normal.  Left Ear: External ear normal.  Nose: Nose normal.  Mouth/Throat: Oropharynx is clear and moist.  Eyes: Pupils are equal, round, and reactive to light. Conjunctivae are normal.  Neck: No JVD present.  Cardiovascular: Regular rhythm, normal heart sounds and intact distal pulses. Tachycardia present.  Pulses:      Carotid pulses are 2+ on the right side.  Radial pulses are 2+ on the right side.       Dorsalis pedis pulses are 2+ on the right side.  Pulmonary/Chest: Effort normal and breath sounds normal.  Abdominal: Soft. Bowel sounds are normal. She exhibits no distension. There is no abdominal tenderness.  Musculoskeletal:        General: No edema. Normal range of motion.     Cervical back: Normal range of motion.  Neurological: She is alert and oriented to person, place, and time.  Psychiatric: Her behavior is normal. Judgment normal.    BP (!) 141/91   Pulse (!) 103   Ht '5\' 7"'  (1.702 m)   Wt 283 lb 9.6 oz (128.6 kg)   SpO2 100%   BMI 44.42 kg/m  Wt Readings from Last 3 Encounters:  06/06/19 283 lb 9.6 oz (128.6 kg)  10/28/18 285 lb (129.3 kg)  10/19/18 285 lb (129.3 kg)     Health Maintenance Due  Topic Date Due  . HIV Screening  Never done  . TETANUS/TDAP  Never done    There are no preventive care reminders to display for this patient.  Lab Results  Component Value Date   TSH 2.260 12/30/2017   Lab Results  Component Value Date   WBC 8.4 07/21/2017   HGB 13.8 07/21/2017   HCT 42.7 07/21/2017   MCV 78.3 07/21/2017   PLT 238 07/21/2017   Lab Results  Component Value Date   NA 136 11/26/2017   K 4.1 11/26/2017   CO2 20 11/26/2017   GLUCOSE 216 (H) 11/26/2017   BUN 8 11/26/2017    CREATININE 0.81 11/26/2017   BILITOT 0.5 11/26/2017   ALKPHOS 63 11/26/2017   AST 12 11/26/2017   ALT 18 11/26/2017   PROT 6.7 11/26/2017   ALBUMIN 3.9 11/26/2017   CALCIUM 9.2 11/26/2017   ANIONGAP 10 07/21/2017   Lab Results  Component Value Date   CHOL 214 (H) 11/26/2017   Lab Results  Component Value Date   HDL 44 11/26/2017   Lab Results  Component Value Date   LDLCALC 125 (H) 11/26/2017   Lab Results  Component Value Date   TRIG 223 (H) 11/26/2017   Lab Results  Component Value Date   CHOLHDL 4.9 (H) 11/26/2017   Lab Results  Component Value Date   HGBA1C 11.7 (A) 06/06/2019      Assessment & Plan:   Problem List Items Addressed This Visit      Cardiovascular and Mediastinum   Hypertension  BP is elevated today at 141/91 Patient endorses compliance with medication and should continue on ARB and low-diet diet like DASH diet Patient endorses walking and should continue exercise with goal of 150 min of activity a week     Endocrine   Type 2 diabetes mellitus with hyperglycemia (HCC) - Primary (Chronic)  Uncontrolled.  HbA1c is increased from 11.0 to 11.7 since last visit.  Patient is agreeable to initiating 12 units of Lantus at night, while continuing current medications to improve glycemic control. Patient is advised about the risk of hypoglycemia with Insulin and sulfonylureas and advised to continue checking fasting blood sugars with goal 80-120 and reduce insulin by 2 units if sugars are less than 70. Referral for Nutritional counseling to help diabetic management with diet and the patient should continue exercise. Patient is fasting today's visit, so we will check labs today:   Relevant Medications   glipiZIDE (GLUCOTROL) 10 MG tablet   insulin glargine (LANTUS SOLOSTAR) 100 UNIT/ML Solostar  Pen   Insulin Pen Needle (TRUEPLUS PEN NEEDLES) 32G X 4 MM MISC   Other Relevant Orders   POCT glucose (manual entry) (Completed)   POCT glycosylated  hemoglobin (Hb A1C) (Completed)   Lipid panel   CMP14+EGFR   Microalbumin / creatinine urine ratio   Amb ref to Medical Nutrition Therapy-MNT    Other Visit Diagnoses    Screening for viral disease     Patient is due for HIV screening. We will check labs today.   Relevant Orders   HIV Antibody (routine testing w rflx)     Healthcare Maintenance: We discussed the importance of Pneumonia vaccine, but patient declined.  Meds ordered this encounter  Medications  . glipiZIDE (GLUCOTROL) 10 MG tablet    Sig: Take 2 tablets (20 mg total) by mouth 2 (two) times daily before a meal.    Dispense:  360 tablet    Refill:  1    Dose change  . insulin glargine (LANTUS SOLOSTAR) 100 UNIT/ML Solostar Pen    Sig: Inject 12 Units into the skin daily.    Dispense:  5 pen    Refill:  3  . Insulin Pen Needle (TRUEPLUS PEN NEEDLES) 32G X 4 MM MISC    Sig: Use as directed once daily to administer victoza and Lantus    Dispense:  100 each    Refill:  3    Pharmacy may substitute brand for insurance or inventory purposes    Follow-up: Return in about 3 months (around 09/05/2019) for Chronic disease management.    Theodis Sato, Medical Student   Evaluation and management procedures were performed by me with Medical Student in attendance, note written by Medical Student under my supervision and collaboration. I have reviewed the note and I agree with the management and plan.  Ms. Cuadra has uncontrolled diabetes mellitus and is reluctant to commence insulin but eventually agreed after joint decision-making.  I have discussed with her the possibility of tapering her off of glipizide while increasing her dose of Lantus down the road as she is concerned about taking too many medications.  There is also the option of switching her from a daily GLP-1 to a weekly GLP-1 in the future. We have emphasized lifestyle modifications and we will revisit this again at her next visit along with Pneumovax which she  declines today.  Charlott Rakes, MD, FAAFP. [facility] and [facility] [place], [place]   06/06/2019, 12:56 PM

## 2019-06-06 NOTE — Patient Instructions (Signed)

## 2019-06-07 LAB — CMP14+EGFR
ALT: 32 IU/L (ref 0–32)
AST: 25 IU/L (ref 0–40)
Albumin/Globulin Ratio: 1.6 (ref 1.2–2.2)
Albumin: 4.7 g/dL (ref 3.8–4.8)
Alkaline Phosphatase: 67 IU/L (ref 39–117)
BUN/Creatinine Ratio: 10 (ref 9–23)
BUN: 9 mg/dL (ref 6–24)
Bilirubin Total: 0.4 mg/dL (ref 0.0–1.2)
CO2: 22 mmol/L (ref 20–29)
Calcium: 10.2 mg/dL (ref 8.7–10.2)
Chloride: 99 mmol/L (ref 96–106)
Creatinine, Ser: 0.9 mg/dL (ref 0.57–1.00)
GFR calc Af Amer: 92 mL/min/{1.73_m2} (ref >59)
GFR calc non Af Amer: 80 mL/min/{1.73_m2} (ref >59)
Globulin, Total: 2.9 g/dL (ref 1.5–4.5)
Glucose: 209 mg/dL — ABNORMAL HIGH (ref 65–99)
Potassium: 4.3 mmol/L (ref 3.5–5.2)
Sodium: 137 mmol/L (ref 134–144)
Total Protein: 7.6 g/dL (ref 6.0–8.5)

## 2019-06-07 LAB — LIPID PANEL
Chol/HDL Ratio: 3.7 ratio (ref 0.0–4.4)
Cholesterol, Total: 189 mg/dL (ref 100–199)
HDL: 51 mg/dL (ref >39)
LDL Chol Calc (NIH): 102 mg/dL — ABNORMAL HIGH (ref 0–99)
Triglycerides: 209 mg/dL — ABNORMAL HIGH (ref 0–149)
VLDL Cholesterol Cal: 36 mg/dL (ref 5–40)

## 2019-06-07 LAB — HIV ANTIBODY (ROUTINE TESTING W REFLEX): HIV Screen 4th Generation wRfx: NONREACTIVE

## 2019-06-07 LAB — MICROALBUMIN / CREATININE URINE RATIO
Creatinine, Urine: 180.1 mg/dL
Microalb/Creat Ratio: 14 mg/g creat (ref 0–29)
Microalbumin, Urine: 26.1 ug/mL

## 2019-06-09 ENCOUNTER — Telehealth: Payer: Self-pay

## 2019-06-09 NOTE — Telephone Encounter (Signed)
-----   Message from Hoy Register, MD sent at 06/08/2019  2:10 PM EDT ----- Please inform the patient that labs are normal. Thank you.

## 2019-06-09 NOTE — Telephone Encounter (Signed)
Patient name and DOB has been verified Patient was informed of lab results. Patient had no questions.  

## 2019-06-28 ENCOUNTER — Other Ambulatory Visit: Payer: Self-pay | Admitting: Family Medicine

## 2019-06-28 DIAGNOSIS — I10 Essential (primary) hypertension: Secondary | ICD-10-CM

## 2019-06-28 DIAGNOSIS — E1165 Type 2 diabetes mellitus with hyperglycemia: Secondary | ICD-10-CM

## 2019-08-18 ENCOUNTER — Ambulatory Visit: Payer: PRIVATE HEALTH INSURANCE | Admitting: Dietician

## 2019-09-05 ENCOUNTER — Ambulatory Visit: Payer: PRIVATE HEALTH INSURANCE | Attending: Family Medicine | Admitting: Family Medicine

## 2019-09-05 ENCOUNTER — Encounter: Payer: Self-pay | Admitting: Family Medicine

## 2019-09-05 ENCOUNTER — Other Ambulatory Visit: Payer: Self-pay

## 2019-09-05 VITALS — BP 132/90 | HR 103 | Ht 67.0 in | Wt 281.0 lb

## 2019-09-05 DIAGNOSIS — E1165 Type 2 diabetes mellitus with hyperglycemia: Secondary | ICD-10-CM

## 2019-09-05 DIAGNOSIS — I10 Essential (primary) hypertension: Secondary | ICD-10-CM

## 2019-09-05 LAB — GLUCOSE, POCT (MANUAL RESULT ENTRY): POC Glucose: 334 mg/dl — AB (ref 70–99)

## 2019-09-05 LAB — POCT GLYCOSYLATED HEMOGLOBIN (HGB A1C): HbA1c, POC (controlled diabetic range): 12 % — AB (ref 0.0–7.0)

## 2019-09-05 MED ORDER — LANTUS SOLOSTAR 100 UNIT/ML ~~LOC~~ SOPN: 22.0000 [IU] | PEN_INJECTOR | Freq: Every day | SUBCUTANEOUS | 3 refills | Status: DC

## 2019-09-05 MED ORDER — GLIPIZIDE 10 MG PO TABS: 20.0000 mg | ORAL_TABLET | Freq: Two times a day (BID) | ORAL | 1 refills | Status: DC

## 2019-09-05 MED ORDER — VICTOZA 18 MG/3ML ~~LOC~~ SOPN: 1.8000 mg | PEN_INJECTOR | Freq: Every day | SUBCUTANEOUS | 6 refills | Status: DC

## 2019-09-05 MED ORDER — LOSARTAN POTASSIUM 100 MG PO TABS: 100.0000 mg | ORAL_TABLET | Freq: Every day | ORAL | 1 refills | Status: DC

## 2019-09-05 MED ORDER — SIMVASTATIN 40 MG PO TABS: ORAL_TABLET | ORAL | 1 refills | Status: DC

## 2019-09-05 NOTE — Progress Notes (Signed)
Subjective:  Patient ID: Lynn Anderson, female    DOB: 05-Mar-1980  Age: 40 y.o. MRN: [id number]  CC: Diabetes   HPI Lynn Anderson  is a 39 yo female with a medical history of Type 2 DM  (A1c 12.0). Hyperlipidemia, Hypertension, and obesity that comes into today for her follow up for her chronic medical conditions.   Her sugars have been in the 300s with the lowest in the 180 Sometimes at work she gets sick at night. She gets overheated as she works in the [employer] and has to sit down up prior to OB/GYN surgeries and has noticed symptoms since being on the Lantus.  She is unable to check her sugars in those instances as she is already gowned up.  Takes Lantus 7-8 pm every night.  Takes Victoza in the mornings and is also on glipizide 20 mg twice daily. Compliant with her antihypertensive and her statin. She exercises regularly and tries to adhere to a diabetic diet. Past Medical History:  Diagnosis Date  . Diabetes mellitus without complication ([place])   . Hyperlipidemia   . Hypertension     Past Surgical History:  Procedure Laterality Date  . CHOLECYSTECTOMY    . Mirena      Inserted 10-16-14    Family History  Problem Relation Age of Onset  . Hyperlipidemia Mother   . Hypertension Mother   . Diabetes Mother   . Diabetes Maternal Grandmother   . Heart disease Father   . Diabetes Maternal Aunt     Allergies  Allergen Reactions  . Metformin And Related Diarrhea    Outpatient Medications Prior to Visit  Medication Sig Dispense Refill  . Blood Glucose Monitoring Suppl (ONE TOUCH ULTRA 2) w/Device KIT 1 each by Does not apply route 3 (three) times daily. 1 each 0  . glucose blood (ONETOUCH VERIO) test strip Use to test blood sugar 2 times daily as instructed 100 each 2  . ibuprofen (ADVIL) 800 MG tablet Take 1 tablet (800 mg total) by mouth 3 (three) times daily. 21 tablet 0  . Insulin Pen Needle (TRUEPLUS PEN NEEDLES) 32G X 4 MM MISC Use as directed once daily to administer  victoza and Lantus 100 each 3  . Lancets (ONETOUCH ULTRASOFT) lancets Use as instructed 100 each 12  . levonorgestrel (MIRENA) 20 MCG/24HR IUD 1 each by Intrauterine route once.    Glory Rosebush DELICA LANCETS FINE MISC Use to test blood sugar 2 times daily as instructed. 100 each 2  . glipiZIDE (GLUCOTROL) 10 MG tablet Take 2 tablets (20 mg total) by mouth 2 (two) times daily before a meal. 360 tablet 1  . insulin glargine (LANTUS SOLOSTAR) 100 UNIT/ML Solostar Pen Inject 12 Units into the skin daily. 5 pen 3  . liraglutide (VICTOZA) 18 MG/3ML SOPN Inject 0.3 mLs (1.8 mg total) into the skin daily. 9 mL 6  . losartan (COZAAR) 100 MG tablet TAKE 1 TABLET BY MOUTH EVERY DAY 30 tablet 2  . simvastatin (ZOCOR) 40 MG tablet TAKE 1 TABLET BY MOUTH EVERY DAY AT 6 PM 30 tablet 2  . cyclobenzaprine (FLEXERIL) 10 MG tablet Take 1 tablet (10 mg total) by mouth 2 (two) times daily as needed for muscle spasms. (Patient not taking: Reported on 09/05/2019) 20 tablet 0  . fluconazole (DIFLUCAN) 150 MG tablet Take one tablet by mouth now and repeat in 3 days. Then 1 tablet weekly as needed. (Patient not taking: Reported on 09/05/2019) 15 tablet 0  No facility-administered medications prior to visit.     ROS Review of Systems  Constitutional: Negative for activity change, appetite change and fatigue.  HENT: Negative for congestion, sinus pressure and sore throat.   Eyes: Negative for visual disturbance.  Respiratory: Negative for cough, chest tightness, shortness of breath and wheezing.   Cardiovascular: Negative for chest pain and palpitations.  Gastrointestinal: Negative for abdominal distention, abdominal pain and constipation.  Endocrine: Negative for polydipsia.  Genitourinary: Negative for dysuria and frequency.  Musculoskeletal: Negative for arthralgias and back pain.  Skin: Negative for rash.  Neurological: Negative for tremors, light-headedness and numbness.  Hematological: Does not bruise/bleed  easily.  Psychiatric/Behavioral: Negative for agitation and behavioral problems.    Objective:  BP (!) 132/90   Pulse 103   Ht '5\' 7"'  (1.702 m)   Wt (!) 281 lb (127.5 kg)   SpO2 100%   BMI 44.01 kg/m   BP/Weight 09/05/2019 06/06/2019 05/08/760  Systolic BP 263 335 456  Diastolic BP 90 91 256  Wt. (Lbs) 281 283.6 285  BMI 44.01 44.42 44.64      Physical Exam Constitutional:      Appearance: She is well-developed.  Neck:     Vascular: No JVD.  Cardiovascular:     Rate and Rhythm: Normal rate.     Heart sounds: Normal heart sounds. No murmur heard.   Pulmonary:     Effort: Pulmonary effort is normal.     Breath sounds: Normal breath sounds. No wheezing or rales.  Chest:     Chest wall: No tenderness.  Abdominal:     General: Bowel sounds are normal. There is no distension.     Palpations: Abdomen is soft. There is no mass.     Tenderness: There is no abdominal tenderness.  Musculoskeletal:        General: Normal range of motion.     Right lower leg: No edema.     Left lower leg: No edema.  Neurological:     Mental Status: She is alert and oriented to person, place, and time.  Psychiatric:        Mood and Affect: Mood normal.     CMP Latest Ref Rng & Units 06/06/2019 11/26/2017 07/21/2017  Glucose 65 - 99 mg/dL 209(H) 216(H) 278(H)  BUN 6 - 24 mg/dL '9 8 7  ' Creatinine 0.57 - 1.00 mg/dL 0.90 0.81 0.72  Sodium 134 - 144 mmol/L 137 136 135  Potassium 3.5 - 5.2 mmol/L 4.3 4.1 3.9  Chloride 96 - 106 mmol/L 99 99 104  CO2 20 - 29 mmol/L 22 20 21(L)  Calcium 8.7 - 10.2 mg/dL 10.2 9.2 8.2(L)  Total Protein 6.0 - 8.5 g/dL 7.6 6.7 6.7  Total Bilirubin 0.0 - 1.2 mg/dL 0.4 0.5 0.7  Alkaline Phos 39 - 117 IU/L 67 63 63  AST 0 - 40 IU/L 25 12 42(H)  ALT 0 - 32 IU/L 32 18 40    Lipid Panel     Component Value Date/Time   CHOL 189 06/06/2019 1128   TRIG 209 (H) 06/06/2019 1128   HDL 51 06/06/2019 1128   CHOLHDL 3.7 06/06/2019 1128   CHOLHDL 5.4 (H) 10/07/2016 1131    VLDL 55 (H) 10/07/2016 1131   LDLCALC 102 (H) 06/06/2019 1128    CBC    Component Value Date/Time   WBC 8.4 07/21/2017 1230   RBC 5.45 (H) 07/21/2017 1230   HGB 13.8 07/21/2017 1230   HCT 42.7 07/21/2017 1230   PLT  238 07/21/2017 1230   MCV 78.3 07/21/2017 1230   MCH 25.3 (L) 07/21/2017 1230   MCHC 32.3 07/21/2017 1230   RDW 12.6 07/21/2017 1230   LYMPHSABS 2,592 10/07/2016 1131   MONOABS 672 10/07/2016 1131   EOSABS 96 10/07/2016 1131   BASOSABS 0 10/07/2016 1131    Lab Results  Component Value Date   HGBA1C 12.0 (A) 09/05/2019    Assessment & Plan:  1. 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  She will titrate her blood sugars up every third day by 2 units until blood sugars are at goal Counseled on Diabetic diet, my plate method, 314 minutes of moderate intensity exercise/week Blood sugar logs with fasting goals of 80-120 mg/dl, random of less than 180 and in the event of sugars less than 60 mg/dl or greater than 400 mg/dl encouraged to notify the clinic. Advised on the need for annual eye exams, annual foot exams, Pneumonia vaccine. - POCT glucose (manual entry) - POCT glycosylated hemoglobin (Hb A1C) - glipiZIDE (GLUCOTROL) 10 MG tablet; Take 2 tablets (20 mg total) by mouth 2 (two) times daily before a meal.  Dispense: 360 tablet; Refill: 1 - insulin glargine (LANTUS SOLOSTAR) 100 UNIT/ML Solostar Pen; Inject 22 Units into the skin daily. Titrate by 2 units every 3rd day to a until blood sugar is at goal. Max daily dose 30 units.  Dispense: 5 pen; Refill: 3 - liraglutide (VICTOZA) 18 MG/3ML SOPN; Inject 0.3 mLs (1.8 mg total) into the skin daily.  Dispense: 9 mL; Refill: 6 - simvastatin (ZOCOR) 40 MG tablet; TAKE 1 TABLET BY MOUTH EVERY DAY AT 6 PM  Dispense: 90  tablet; Refill: 1  2. Essential hypertension Controlled Counseled on blood pressure goal of less than 130/80, low-sodium, DASH diet, medication compliance, 150 minutes of moderate intensity exercise per week. Discussed medication compliance, adverse effects. - losartan (COZAAR) 100 MG tablet; Take 1 tablet (100 mg total) by mouth daily.  Dispense: 90 tablet; Refill: 1   Meds ordered this encounter  Medications  . losartan (COZAAR) 100 MG tablet    Sig: Take 1 tablet (100 mg total) by mouth daily.    Dispense:  90 tablet    Refill:  1  . glipiZIDE (GLUCOTROL) 10 MG tablet    Sig: Take 2 tablets (20 mg total) by mouth 2 (two) times daily before a meal.    Dispense:  360 tablet    Refill:  1    Dose change  . insulin glargine (LANTUS SOLOSTAR) 100 UNIT/ML Solostar Pen    Sig: Inject 22 Units into the skin daily. Titrate by 2 units every 3rd day to a until blood sugar is at goal. Max daily dose 30 units.    Dispense:  5 pen    Refill:  3  . liraglutide (VICTOZA) 18 MG/3ML SOPN    Sig: Inject 0.3 mLs (1.8 mg total) into the skin daily.    Dispense:  9 mL    Refill:  6  . simvastatin (ZOCOR) 40 MG tablet    Sig: TAKE 1 TABLET BY MOUTH EVERY DAY AT 6 PM    Dispense:  90 tablet    Refill:  1    Follow-up: Return in about 3 months (around 12/06/2019) for Diabetes.  Charlott Rakes, MD, FAAFP. [facility] and [facility], [place]   09/05/2019, 2:15 PM

## 2019-09-05 NOTE — Patient Instructions (Signed)

## 2019-09-15 ENCOUNTER — Telehealth: Payer: Self-pay | Admitting: Family Medicine

## 2019-09-15 NOTE — Telephone Encounter (Signed)
Copied from CRM [id number]. Topic: General - Inquiry >> Sep 15, 2019 12:54 PM Crist Infante wrote: Reason for CRM: angie with [facility] needs an updated medlist and the most recent labs that are done. Fax [phone]

## 2019-09-19 NOTE — Telephone Encounter (Signed)
Call was placed to angie and a vm was left informing her to return phone call.  Patient would need to come in a sign a medical release form in order for [place] to send requested information.

## 2019-09-21 ENCOUNTER — Other Ambulatory Visit: Payer: Self-pay

## 2019-09-21 ENCOUNTER — Encounter: Payer: Self-pay | Admitting: Dietician

## 2019-09-21 ENCOUNTER — Encounter: Payer: PRIVATE HEALTH INSURANCE | Attending: Family Medicine | Admitting: Dietician

## 2019-09-21 DIAGNOSIS — E1165 Type 2 diabetes mellitus with hyperglycemia: Secondary | ICD-10-CM | POA: Diagnosis present

## 2019-09-21 NOTE — Patient Instructions (Addendum)
Check blood sugar twice a day. Once fasting, and once after your biggest meal.  Remember consistency!   Balance your meals with protein, and non starchy vegetables.  Aim for 3 carb choices each meal. If you are constantly hungry, try 4.  Read your label on your Honey Nut Cheerios, and 2% milk.  Measure out one meal that fits your 3 carb choice amount. Consider whether or not it will satisfy you.  Consider contacting your PCP about your medication if you feel the dosage needs to be adjusted.

## 2019-09-21 NOTE — Progress Notes (Signed)
Diabetes Self-Management Education  Visit Type: First/Initial  Appt. Start Time: 2:00 Appt. End Time: 3:10  09/21/2019  [name], identified by name and date of birth, is a 39 y.o. female with a diagnosis of Diabetes: Type 2.   ASSESSMENT  Pt reports not having any expectations from this appointment. Pt reports not seeing any improvement in blood glucose control with her medication.  Pt notices it will be low (180), but usually high (300's). Advise pt to contact her PCP regarding her dosage of diabetes medications. Pt usually check blood sugar either first thing before she eats, or 5-6 hours after eating. Pt considers her health fair because it could better (eating better, more exercise) Pt is afraid of her diabetes because her A1c could be better. Pt sees pasta and rice as barriers to controlling her diabetes, tends to over consume. 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 Pt reports having days of missing breakfast, and ending her day eating larger meals.  Height 5\' 7"  (1.702 m), weight 289 lb 11.2 oz (131.4 kg). Body mass index is 45.37 kg/m.   Diabetes Self-Management Education - 09/21/19 1415      Visit Information   Visit Type First/Initial      Initial Visit   Diabetes Type Type 2    Are you currently following a meal plan? No    Are you taking your medications as prescribed? Yes    Date Diagnosed 2018      Health Coping   How would you rate your overall health? Fair      Psychosocial Assessment   Patient Belief/Attitude about Diabetes Motivated to manage diabetes    Self-care barriers None    Self-management support Family    Patient Concerns Nutrition/Meal planning;Glycemic Control;Problem Solving    Special Needs None    Preferred Learning Style No preference indicated    Learning Readiness Ready    How often do you need to have someone help you when you read instructions, pamphlets, or other written  materials from your doctor or pharmacy? 1 - Never    What is the last grade level you completed in school? college      Pre-Education Assessment   Patient understands the diabetes disease and treatment process. Needs Instruction    Patient understands incorporating nutritional management into lifestyle. Needs Review    Patient undertands incorporating physical activity into lifestyle. Needs Review    Patient understands using medications safely. Needs Review    Patient understands monitoring blood glucose, interpreting and using results Needs Review    Patient understands prevention, detection, and treatment of acute complications. Needs Instruction    Patient understands prevention, detection, and treatment of chronic complications. Needs Instruction    Patient understands how to develop strategies to address psychosocial issues. Needs Instruction    Patient understands how to develop strategies to promote health/change behavior. Needs Instruction      Complications   Last HgB A1C per patient/outside source 12 %    How often do you check your blood sugar? 1-2 times/day    Fasting Blood glucose range (mg/dL) 2019    Postprandial Blood glucose range (mg/dL) [phone]    Number of hypoglycemic episodes per month 0    Number of hyperglycemic episodes per week 7    Can you tell when your blood sugar is high? No   Advise pt to discuss prescription dosage with PCP   Have you had a  dilated eye exam in the past 12 months? Yes    Have you had a dental exam in the past 12 months? Yes    Are you checking your feet? Yes    How many days per week are you checking your feet? 7      Dietary Intake   Breakfast Honey Nut cheerios, 2% milk    Snack (morning) none reported    Lunch Salad with cheese, bacon bits, boiled egg. Miracle Whip    Snack (afternoon) Pudding or jello cup    Dinner none reported    Snack (evening) Pudding or Jello Cup    Beverage(s) 2% milk, water      Exercise   Exercise Type  ADL's;Light (walking / raking leaves)    How many days per week to you exercise? 2    How many minutes per day do you exercise? 30    Total minutes per week of exercise 60      Patient Education   Previous Diabetes Education No    Disease state  Definition of diabetes, type 1 and 2, and the diagnosis of diabetes;Explored patient's options for treatment of their diabetes    Nutrition management  Role of diet in the treatment of diabetes and the relationship between the three main macronutrients and blood glucose level;Food label reading, portion sizes and measuring food.;Carbohydrate counting;Reviewed blood glucose goals for pre and post meals and how to evaluate the patients' food intake on their blood glucose level.    Physical activity and exercise  Role of exercise on diabetes management, blood pressure control and cardiac health.    Medications Reviewed patients medication for diabetes, action, purpose, timing of dose and side effects.    Monitoring Purpose and frequency of SMBG.;Taught/discussed recording of test results and interpretation of SMBG.    Chronic complications Assessed and discussed foot care and prevention of foot problems;Relationship between chronic complications and blood glucose control    Psychosocial adjustment Worked with patient to identify barriers to care and solutions;Helped patient identify a support system for diabetes management;Identified and addressed patients feelings and concerns about diabetes    Personal strategies to promote health Lifestyle issues that need to be addressed for better diabetes care      Individualized Goals (developed by patient)   Nutrition Follow meal plan discussed    Physical Activity Exercise 1-2 times per week    Medications take my medication as prescribed   Contact PCP if pt feels dosage needs adjustment   Monitoring  test my blood glucose as discussed;test blood glucose pre and post meals as discussed    Reducing Risk examine  blood glucose patterns;do foot checks daily      Post-Education Assessment   Patient understands the diabetes disease and treatment process. Needs Review    Patient understands incorporating nutritional management into lifestyle. Needs Review    Patient undertands incorporating physical activity into lifestyle. Needs Review    Patient understands using medications safely. Needs Review    Patient understands monitoring blood glucose, interpreting and using results Needs Review    Patient understands prevention, detection, and treatment of acute complications. Needs Review    Patient understands prevention, detection, and treatment of chronic complications. Needs Review    Patient understands how to develop strategies to address psychosocial issues. Needs Review    Patient understands how to develop strategies to promote health/change behavior. Needs Review      Outcomes   Expected Outcomes Demonstrated interest in learning. Expect positive  outcomes    Future DMSE 4-6 wks    Program Status Not Completed           Individualized Plan for Diabetes Self-Management Training:   Learning Objective:  Patient will have a greater understanding of diabetes self-management. Patient education plan is to attend individual and/or group sessions per assessed needs and concerns.   Plan:   Patient Instructions  Check blood sugar twice a day. Once fasting, and once after your biggest meal.  Remember consistency!   Balance your meals with protein, and non starchy vegetables.  Aim for 3 carb choices each meal. If you are constantly hungry, try 4.  Read your label on your Honey Nut Cheerios, and 2% milk.  Measure out one meal that fits your 3 carb choice amount. Consider whether or not it will satisfy you.  Consider contacting your PCP about your medication if you feel the dosage needs to be adjusted.   Expected Outcomes:  Demonstrated interest in learning. Expect positive outcomes  Education  material provided: ADA - How to Thrive: A Guide for Your Journey with Diabetes and Meal plan card  If problems or questions, patient to contact team via:  Phone and Email  Future DSME appointment: 4-6 wks

## 2019-10-19 ENCOUNTER — Ambulatory Visit: Payer: PRIVATE HEALTH INSURANCE | Admitting: Dietician

## 2019-10-25 ENCOUNTER — Other Ambulatory Visit: Payer: Self-pay

## 2019-10-25 ENCOUNTER — Encounter: Payer: Self-pay | Admitting: Nurse Practitioner

## 2019-10-25 ENCOUNTER — Ambulatory Visit (INDEPENDENT_AMBULATORY_CARE_PROVIDER_SITE_OTHER): Payer: PRIVATE HEALTH INSURANCE | Admitting: Nurse Practitioner

## 2019-10-25 VITALS — BP 130/80 | Ht 67.0 in | Wt 284.0 lb

## 2019-10-25 DIAGNOSIS — Z975 Presence of (intrauterine) contraceptive device: Secondary | ICD-10-CM

## 2019-10-25 DIAGNOSIS — Z01419 Encounter for gynecological examination (general) (routine) without abnormal findings: Secondary | ICD-10-CM | POA: Diagnosis not present

## 2019-10-25 NOTE — Progress Notes (Signed)
   Lynn Anderson April 19, 1980 [id number]   History:  39 y.o. G2P0012 presents for annual exam. IUD inserted 10/2014. Normal pap history. Has not had screening mammogram. History of T2DM, HLD, and HTN managed by primary care. Sexually active occasionally.   Gynecologic History No LMP recorded (lmp unknown). (Menstrual status: IUD).   Contraception: IUD Last Pap: 10/13/2017. Results were: normal Last mammogram: Never   Past medical history, past surgical history, family history and social history were all reviewed and documented in the EPIC chart.  ROS:  A ROS was performed and pertinent positives and negatives are included.  Exam:  Vitals:   10/25/19 1051  BP: 130/80  Weight: 284 lb (128.8 kg)  Height: 5\' 7"  (1.702 m)   Body mass index is 44.48 kg/m.  General appearance:  Normal Thyroid:  Symmetrical, normal in size, without palpable masses or nodularity. Respiratory  Auscultation:  Clear without wheezing or rhonchi Cardiovascular  Auscultation:  Regular rate, without rubs, murmurs or gallops  Edema/varicosities:  Not grossly evident Abdominal  Soft,nontender, without masses, guarding or rebound.  Liver/spleen:  No organomegaly noted  Hernia:  None appreciated  Skin  Inspection:  Grossly normal   Breasts: Examined lying and sitting.   Right: Without masses, retractions, discharge or axillary adenopathy.   Left: Without masses, retractions, discharge or axillary adenopathy. Gentitourinary   Inguinal/mons:  Normal without inguinal adenopathy  External genitalia:  Normal  BUS/Urethra/Skene's glands:  Normal  Vagina:  Normal  Cervix:  Normal, IUD string visible in cervical os  Uterus:  Difficult to palpate due to body habitus but no gross masses or tenderness  Adnexa/parametria:     Rt: Without masses or tenderness.   Lt: Without masses or tenderness.  Anus and perineum: Normal  Assessment/Plan:  39 y.o. 39 for annual exam.   Well female exam with routine  gynecological exam - Education provided on SBEs, importance of preventative screenings, current guidelines, high calcium diet, regular exercise, and multivitamin daily. Labs done with PCP.  IUD (intrauterine device) in place - Inserted 10/2014, due to be removed. She would like to have this reinserted. We will check coverage and schedule removal and reinsertion with Dr. 11/2014. Recommend using other form of contraception until appointment.   Screening for breast cancer - has not had screening mammogram. Reviewed current guidelines and importance of preventative screenings. Information provided on the breast center.   Follow up in 1 year for annual      Seymour Bars [facility], 11:00 AM 10/25/2019

## 2019-10-25 NOTE — Patient Instructions (Addendum)
[facility] °[phone] °[address]  °[place], [place] [address] ° ° ° °Health Maintenance, Female °Adopting a healthy lifestyle and getting preventive care are important in promoting health and wellness. Ask your health care provider about: °· The right schedule for you to have regular tests and exams. °· Things you can do on your own to prevent diseases and keep yourself healthy. °What should I know about diet, weight, and exercise? °Eat a healthy diet ° °· Eat a diet that includes plenty of vegetables, fruits, low-fat dairy products, and lean protein. °· Do not eat a lot of foods that are high in solid fats, added sugars, or sodium. °Maintain a healthy weight °Body mass index (BMI) is used to identify weight problems. It estimates body fat based on height and weight. Your health care provider can help determine your BMI and help you achieve or maintain a healthy weight. °Get regular exercise °Get regular exercise. This is one of the most important things you can do for your health. Most adults should: °· Exercise for at least 150 minutes each week. The exercise should increase your heart rate and make you sweat (moderate-intensity exercise). °· Do strengthening exercises at least twice a week. This is in addition to the moderate-intensity exercise. °· Spend less time sitting. Even light physical activity can be beneficial. °Watch cholesterol and blood lipids °Have your blood tested for lipids and cholesterol at 41 years of age, then have this test every 5 years. °Have your cholesterol levels checked more often if: °· Your lipid or cholesterol levels are high. °· You are older than 40 years of age. °· You are at high risk for heart disease. °What should I know about cancer screening? °Depending on your health history and family history, you may need to have cancer screening at various ages. This may include screening for: °· Breast cancer. °· Cervical cancer. °· Colorectal  cancer. °· Skin cancer. °· Lung cancer. °What should I know about heart disease, diabetes, and high blood pressure? °Blood pressure and heart disease °· High blood pressure causes heart disease and increases the risk of stroke. This is more likely to develop in people who have high blood pressure readings, are of African descent, or are overweight. °· Have your blood pressure checked: °? Every 3-5 years if you are 18-39 years of age. °? Every year if you are 40 years old or older. °Diabetes °Have regular diabetes screenings. This checks your fasting blood sugar level. Have the screening done: °· Once every three years after age 40 if you are at a normal weight and have a low risk for diabetes. °· More often and at a younger age if you are overweight or have a high risk for diabetes. °What should I know about preventing infection? °Hepatitis B °If you have a higher risk for hepatitis B, you should be screened for this virus. Talk with your health care provider to find out if you are at risk for hepatitis B infection. °Hepatitis C °Testing is recommended for: °· Everyone born from 1945 through 1965. °· Anyone with known risk factors for hepatitis C. °Sexually transmitted infections (STIs) °· Get screened for STIs, including gonorrhea and chlamydia, if: °? You are sexually active and are younger than 41 years of age. °? You are older than 41 years of age and your health care provider tells you that you are at risk for this type of infection. °? Your sexual activity has changed since you   were last screened, and you are at increased risk for chlamydia or gonorrhea. Ask your health care provider if you are at risk. °· Ask your health care provider about whether you are at high risk for HIV. Your health care provider may recommend a prescription medicine to help prevent HIV infection. If you choose to take medicine to prevent HIV, you should first get tested for HIV. You should then be tested every 3 months for as long as  you are taking the medicine. °Pregnancy °· If you are about to stop having your period (premenopausal) and you may become pregnant, seek counseling before you get pregnant. °· Take 400 to 800 micrograms (mcg) of folic acid every day if you become pregnant. °· Ask for birth control (contraception) if you want to prevent pregnancy. °Osteoporosis and menopause °Osteoporosis is a disease in which the bones lose minerals and strength with aging. This can result in bone fractures. If you are 65 years old or older, or if you are at risk for osteoporosis and fractures, ask your health care provider if you should: °· Be screened for bone loss. °· Take a calcium or vitamin D supplement to lower your risk of fractures. °· Be given hormone replacement therapy (HRT) to treat symptoms of menopause. °Follow these instructions at home: °Lifestyle °· Do not use any products that contain nicotine or tobacco, such as cigarettes, e-cigarettes, and chewing tobacco. If you need help quitting, ask your health care provider. °· Do not use street drugs. °· Do not share needles. °· Ask your health care provider for help if you need support or information about quitting drugs. °Alcohol use °· Do not drink alcohol if: °? Your health care provider tells you not to drink. °? You are pregnant, may be pregnant, or are planning to become pregnant. °· If you drink alcohol: °? Limit how much you use to 0-1 drink a day. °? Limit intake if you are breastfeeding. °· Be aware of how much alcohol is in your drink. In the [place] one drink equals one 12 oz bottle of beer (355 mL), one 5 oz glass of wine (148 mL), or one 1½ oz glass of hard liquor (44 mL). °General instructions °· Schedule regular health, dental, and eye exams. °· Stay current with your vaccines. °· Tell your health care provider if: °? You often feel depressed. °? You have ever been abused or do not feel safe at home. °Summary °· Adopting a healthy lifestyle and getting preventive care are  important in promoting health and wellness. °· Follow your health care provider's instructions about healthy diet, exercising, and getting tested or screened for diseases. °· Follow your health care provider's instructions on monitoring your cholesterol and blood pressure. °This information is not intended to replace advice given to you by your health care provider. Make sure you discuss any questions you have with your health care provider. °Document Revised: 01/19/2018 Document Reviewed: 01/19/2018 °Elsevier Patient Education © 2020 [employer] ° °

## 2019-11-01 ENCOUNTER — Encounter: Payer: Self-pay | Admitting: Obstetrics & Gynecology

## 2019-11-01 ENCOUNTER — Other Ambulatory Visit: Payer: Self-pay

## 2019-11-01 ENCOUNTER — Ambulatory Visit (INDEPENDENT_AMBULATORY_CARE_PROVIDER_SITE_OTHER): Payer: PRIVATE HEALTH INSURANCE | Admitting: Obstetrics & Gynecology

## 2019-11-01 VITALS — BP 126/80

## 2019-11-01 DIAGNOSIS — Z30433 Encounter for removal and reinsertion of intrauterine contraceptive device: Secondary | ICD-10-CM | POA: Diagnosis not present

## 2019-11-01 NOTE — Progress Notes (Signed)
    Lynn Anderson Gelpi May 07, 1980 [id number]        39 y.o.  [id number]   RP: Mirena IUD removal/reinsertion  HPI: Well on Mirena IUD x 10/2014.  No pelvic pain, no abnormal vaginal d/c, no BTB, no fever.  Time to change the IUD to a new one.   OB History  Gravida Para Term Preterm AB Living  2       1 2   SAB TAB Ectopic Multiple Live Births               # Outcome Date GA Lbr Len/2nd Weight Sex Delivery Anes PTL Lv  2 Gravida           1 AB             Past medical history,surgical history, problem list, medications, allergies, family history and social history were all reviewed and documented in the EPIC chart.   Directed ROS with pertinent positives and negatives documented in the history of present illness/assessment and plan.  Exam:  Vitals:   11/01/19 1132  BP: 126/80   General appearance:  Normal                                                                    IUD procedure note       Patient presented to the office today for removal and placement of Mirena IUD. The patient had previously been provided with literature information on this method of contraception. The risks benefits and pros and cons were discussed and all her questions were answered. She is fully aware that this form of contraception is 99% effective and is good for 5 years.  Pelvic exam: Vulva normal Vagina: No lesions or discharge Cervix: No lesions or discharge.  IUD strings visible at the EO.  Easy removal of the IUD by pulling on the strings with a clamp.  IUD complete/intact.  Removal well tolerated without Cx. Uterus: AV position, normal size. Adnexa: No masses or tenderness Rectal exam: Not done  The cervix was cleansed with Betadine solution. Hurricane spray on the cervix.  A single-tooth tenaculum was placed on the anterior cervical lip. The IUD was shown to the patient and inserted in a sterile fashion.  Hysterometry with the IUD as being inserted was 8 cm.  The IUD string was trimmed. The  single-tooth tenaculum was removed. Patient was instructed to return back to the office in one month for follow up.        Assessment/Plan:  39 y.o. G2P0012   1. Encounter for IUD removal and reinsertion Time to change the Mirena IUD after 5 years.  Well on Mirena IUD.  Easy removal of the IUD which was complete and intact.  A new Mirena IUD was inserted easily without complication.  The procedure was well-tolerated.  Postprocedure precautions reviewed.  Follow-up in 4 weeks for IUD check.  46 MD, 11:57 AM 11/01/2019

## 2019-11-17 ENCOUNTER — Other Ambulatory Visit: Payer: Self-pay | Admitting: Family Medicine

## 2019-11-17 DIAGNOSIS — E1165 Type 2 diabetes mellitus with hyperglycemia: Secondary | ICD-10-CM

## 2019-11-17 MED ORDER — TRUEPLUS PEN NEEDLES 32G X 4 MM MISC: 3 refills | Status: DC

## 2019-11-17 NOTE — Telephone Encounter (Signed)
Copied from CRM [id number]. Topic: Quick Communication - Rx Refill/Question >> Nov 17, 2019 10:43 AM Jaquita Rector A wrote: Medication: Insulin Pen Needle (TRUEPLUS PEN NEEDLES) 32G X 4 MM MISC   Has the patient contacted their pharmacy? Yes.   (Agent: If no, request that the patient contact the pharmacy for the refill.) (Agent: If yes, when and what did the pharmacy advise?)  Preferred Pharmacy (with phone number or street name): [facility] - [place], [place] - [address]  Phone:  [phone] Fax:  [phone]     Agent: Please be advised that RX refills may take up to 3 business days. We ask that you follow-up with your pharmacy.

## 2019-11-20 ENCOUNTER — Encounter: Payer: Self-pay | Admitting: Anesthesiology

## 2019-11-23 ENCOUNTER — Other Ambulatory Visit: Payer: Self-pay

## 2019-11-23 ENCOUNTER — Encounter: Payer: PRIVATE HEALTH INSURANCE | Attending: Family Medicine | Admitting: Dietician

## 2019-11-23 DIAGNOSIS — E1165 Type 2 diabetes mellitus with hyperglycemia: Secondary | ICD-10-CM

## 2019-11-23 NOTE — Patient Instructions (Addendum)
Try having beans or lentils more often.  Try substituting [place] for bologna on your sandwich.  Keep more yogurt, breakfast cereals around for breakfast. Pick up a couple gallon tupperware containers for cereal to keep it fresh.  Find a brown rice that you like. Cut out the white sauce.  Try drinking half a glass of juice mixed with half a glass of water, or a smaller glass of juice and alternate sips of water.  Keep checking your blood sugar first thing when you wake up and 2 hours after you start dinner!

## 2019-11-23 NOTE — Progress Notes (Signed)
Diabetes Self-Management Education  Visit Type: Follow-up  Appt. Start Time: 0200 Appt. End Time: 0230  11/23/2019  [name], identified by name and date of birth, is a 39 y.o. female with a diagnosis of Diabetes: Type 2.   ASSESSMENT  Pt has been incorporating more vegetables. Raw bell peppers with a ranch dip, steamed broccoli, cucumbers with balsamic dressing. Eating more fish, half a filet of fried tilapia around twice a month. Reports feeling very hungry waking up, but doesn't always eat. Still skips breakfast about 3 days out of the week. Reports not having a taste for what she has in the house to eat for breakfast as her main hurdle to eating breakfast consistently. Pt has been using the yellow meal plan card to plan her meals. Has cut back on the frequency of rice, and cut her portion size down of pasta. Pt reports having minutemaid juice and sweet tea with dinner occasionally. Will have hibachi rice with chicken, broccoli, and white sauce. Pt states her fasting numbers are higher the next day after this meal. Pt reports lower blood sugar numbers consistently, and is checking more regularly. Reports checking every other day, fasting and sometimes before eating her second meal. FBG - 217 (when she has eaten pasta/rice with dinner night before) 180 (no pasta/rice the night before) PPBG - lowest of 150, usually low to mid 200s. Reports being over 250 maybe twice a week, usually after a high carb meal. Pt has been trying to do more reading (Urban mysteries novels) and walking to manage stress. Walks 30 minutes a day with her dog, 5 days a week.  Pt has not discussed medication with her doctor, but doctor upped her dosage before our initial visit. Pt is waiting it out before contacting doctor.   There were no vitals taken for this visit. There is no height or weight on file to calculate BMI.   Diabetes Self-Management Education - 11/23/19 1359      Visit Information   Visit  Type Follow-up      Initial Visit   Diabetes Type Type 2    Are you currently following a meal plan? Yes   carb counting   Are you taking your medications as prescribed? Yes      Health Coping   How would you rate your overall health? Good      Psychosocial Assessment   Patient Belief/Attitude about Diabetes Motivated to manage diabetes    Other persons present Patient    Learning Readiness Change in progress      Complications   How often do you check your blood sugar? 1-2 times/day    Fasting Blood glucose range (mg/dL) [phone]    Postprandial Blood glucose range (mg/dL) 213-086    Number of hypoglycemic episodes per month 0    Number of hyperglycemic episodes per week 2      Dietary Intake   Breakfast Yoplait peach yogurt, water    Snack (morning) none    Lunch Bell peppers with ranch dip, half bologna sandwich on white wheat    Snack (afternoon) beef jerky, cheese cubes    Dinner Fried tilapia, steamed broccoli, mashed potatoes.    Snack (evening) Canned pineapple, strawberries      Exercise   How many days per week to you exercise? 5    How many minutes per day do you exercise? 30    Total minutes per week of exercise 150      Patient Self-Evaluation of Goals -  Patient rates self as meeting previously set goals (% of time)   Nutrition 50 - 75 %    Physical Activity 50 - 75 %    Medications >75%    Monitoring 25 - 50%    Problem Solving 25 - 50%    Reducing Risk 50 - 75 %    Health Coping 50 - 75 %      Post-Education Assessment   Patient understands the diabetes disease and treatment process. Needs Review    Patient understands incorporating nutritional management into lifestyle. Needs Review    Patient undertands incorporating physical activity into lifestyle. Needs Review    Patient understands using medications safely. Needs Review    Patient understands monitoring blood glucose, interpreting and using results Needs Review    Patient understands prevention,  detection, and treatment of acute complications. Needs Review    Patient understands prevention, detection, and treatment of chronic complications. Needs Review    Patient understands how to develop strategies to address psychosocial issues. Needs Review    Patient understands how to develop strategies to promote health/change behavior. Needs Review      Outcomes   Expected Outcomes Demonstrated interest in learning. Expect positive outcomes    Future DMSE 3-4 months    Program Status Not Completed      Subsequent Visit   Since your last visit have you continued or begun to take your medications as prescribed? Yes    Since your last visit have you had your blood pressure checked? No    Since your last visit have you experienced any weight changes? No change    Since your last visit, are you checking your blood glucose at least once a day? No   Pt checks every other day          Individualized Plan for Diabetes Self-Management Training:   Learning Objective:  Patient will have a greater understanding of diabetes self-management. Patient education plan is to attend individual and/or group sessions per assessed needs and concerns.   Plan:   Patient Instructions  Try having beans or lentils more often.  Try substituting [place] for bologna on your sandwich.  Keep more yogurt, breakfast cereals around for breakfast. Pick up a couple gallon tupperware containers for cereal to keep it fresh.  Find a brown rice that you like. Cut out the white sauce.  Try drinking half a glass of juice mixed with half a glass of water, or a smaller glass of juice and alternate sips of water.  Keep checking your blood sugar first thing when you wake up and 2 hours after you start dinner!      Expected Outcomes:  Demonstrated interest in learning. Expect positive outcomes   If problems or questions, patient to contact team via:  Phone and Email  Future DSME appointment: 3-4 months

## 2019-11-24 ENCOUNTER — Encounter: Payer: Self-pay | Admitting: Dietician

## 2019-11-28 ENCOUNTER — Ambulatory Visit: Payer: PRIVATE HEALTH INSURANCE | Admitting: Obstetrics & Gynecology

## 2019-11-28 ENCOUNTER — Encounter: Payer: Self-pay | Admitting: Obstetrics & Gynecology

## 2019-11-28 ENCOUNTER — Other Ambulatory Visit: Payer: Self-pay

## 2019-11-28 VITALS — BP 130/80

## 2019-11-28 DIAGNOSIS — Z30431 Encounter for routine checking of intrauterine contraceptive device: Secondary | ICD-10-CM

## 2019-11-28 NOTE — Progress Notes (Signed)
    Melessa Cowell Abshier [birth year] [id number]        39 y.o.  [id number]   RP: Mirena IUD check post insertion on 11/01/2019  HPI: Doing very well with new Mirena IUD.  No vaginal bleeding, no d/c.  No pelvic pain.  Abstinent x insertion.  No fever.   OB History  Gravida Para Term Preterm AB Living  2       1 2   SAB TAB Ectopic Multiple Live Births               # Outcome Date GA Lbr Len/2nd Weight Sex Delivery Anes PTL Lv  2 Gravida           1 AB             Past medical history,surgical history, problem list, medications, allergies, family history and social history were all reviewed and documented in the EPIC chart.   Directed ROS with pertinent positives and negatives documented in the history of present illness/assessment and plan.  Exam:  Vitals:   11/28/19 1444  BP: 130/80   General appearance:  Normal  Abdomen: Normal  Gynecologic exam: Vulva normal.  Speculum:  Cervix normal, no erythema.  IUD strings visible at [facility].  Normal secretions.     Assessment/Plan:  39 y.o. G2P0012   1. Encounter for routine checking of intrauterine contraceptive device (IUD) Well on new Mirena IUD x 11/01/2019.  No sign of infection, no abnormal bleeding, no pelvic pain and IUD in good IU location with strings visible at the EO.  Patient reassured.  F/U Annual-Gyn exam in 10/2020.  11/2020 MD, 2:54 PM 11/28/2019

## 2019-12-06 ENCOUNTER — Encounter: Payer: Self-pay | Admitting: Family Medicine

## 2019-12-06 ENCOUNTER — Other Ambulatory Visit: Payer: Self-pay

## 2019-12-06 ENCOUNTER — Ambulatory Visit: Payer: PRIVATE HEALTH INSURANCE | Attending: Family Medicine | Admitting: Family Medicine

## 2019-12-06 VITALS — BP 126/82 | HR 102 | Ht 67.0 in | Wt 283.0 lb

## 2019-12-06 DIAGNOSIS — I10 Essential (primary) hypertension: Secondary | ICD-10-CM

## 2019-12-06 DIAGNOSIS — Z1231 Encounter for screening mammogram for malignant neoplasm of breast: Secondary | ICD-10-CM | POA: Diagnosis not present

## 2019-12-06 DIAGNOSIS — Z1159 Encounter for screening for other viral diseases: Secondary | ICD-10-CM | POA: Diagnosis not present

## 2019-12-06 DIAGNOSIS — E1165 Type 2 diabetes mellitus with hyperglycemia: Secondary | ICD-10-CM

## 2019-12-06 LAB — POCT GLYCOSYLATED HEMOGLOBIN (HGB A1C): HbA1c, POC (controlled diabetic range): 11.7 % — AB (ref 0.0–7.0)

## 2019-12-06 LAB — GLUCOSE, POCT (MANUAL RESULT ENTRY): POC Glucose: 226 mg/dl — AB (ref 70–99)

## 2019-12-06 MED ORDER — VICTOZA 18 MG/3ML ~~LOC~~ SOPN: 1.8000 mg | PEN_INJECTOR | Freq: Every day | SUBCUTANEOUS | 6 refills | Status: DC

## 2019-12-06 MED ORDER — LOSARTAN POTASSIUM 100 MG PO TABS: 100.0000 mg | ORAL_TABLET | Freq: Every day | ORAL | 1 refills | Status: DC

## 2019-12-06 MED ORDER — SIMVASTATIN 40 MG PO TABS: ORAL_TABLET | ORAL | 1 refills | Status: DC

## 2019-12-06 MED ORDER — LANTUS SOLOSTAR 100 UNIT/ML ~~LOC~~ SOPN: 35.0000 [IU] | PEN_INJECTOR | Freq: Every day | SUBCUTANEOUS | 6 refills | Status: DC

## 2019-12-06 MED ORDER — GLIPIZIDE 10 MG PO TABS: 20.0000 mg | ORAL_TABLET | Freq: Two times a day (BID) | ORAL | 1 refills | Status: DC

## 2019-12-06 NOTE — Progress Notes (Signed)
Subjective:  Patient ID: Lynn Anderson, female    DOB: 1980/06/10  Age: 39 y.o. MRN: [id number]  CC: Diabetes   HPI Lynn Anderson is a 39 yo female with a medical history of Type 2 DM  (A1c 12.0). Hyperlipidemia, Hypertension, and obesity that comes into today for her follow up for her chronic medical conditions.   She has not noticed those symptoms she complained of previously when she took her Lantus at night ever since she switched over to morning. Currently doing 30 units of Lantus in the morning in addition to Victoza and glipizide. Highest sugars are 300 and lowest 120; denies hypoglycemic episodes.  A1c is 11.7 slightly down from 12.0 previously. She is compliant with her antihypertensive and denies adverse effects from her medications. For exercise she walks 30 minutes every day. She has no additional concerns today Past Medical History:  Diagnosis Date  . Diabetes mellitus without complication ([place])   . Hyperlipidemia   . Hypertension     Past Surgical History:  Procedure Laterality Date  . CHOLECYSTECTOMY    . Mirena      Inserted 10-16-14    Family History  Problem Relation Age of Onset  . Hyperlipidemia Mother   . Hypertension Mother   . Diabetes Mother   . Diabetes Maternal Grandmother   . Heart disease Father   . Diabetes Maternal Aunt     Allergies  Allergen Reactions  . Metformin And Related Diarrhea    Outpatient Medications Prior to Visit  Medication Sig Dispense Refill  . Blood Glucose Monitoring Suppl (ONE TOUCH ULTRA 2) w/Device KIT 1 each by Does not apply route 3 (three) times daily. 1 each 0  . glucose blood (ONETOUCH VERIO) test strip Use to test blood sugar 2 times daily as instructed 100 each 2  . ibuprofen (ADVIL) 800 MG tablet Take 1 tablet (800 mg total) by mouth 3 (three) times daily. 21 tablet 0  . Insulin Pen Needle (TRUEPLUS PEN NEEDLES) 32G X 4 MM MISC Use as directed once daily to administer victoza and Lantus 100 each 3  .  Lancets (ONETOUCH ULTRASOFT) lancets Use as instructed 100 each 12  . levonorgestrel (MIRENA) 20 MCG/24HR IUD 1 each by Intrauterine route once.    Glory Rosebush DELICA LANCETS FINE MISC Use to test blood sugar 2 times daily as instructed. 100 each 2  . glipiZIDE (GLUCOTROL) 10 MG tablet Take 2 tablets (20 mg total) by mouth 2 (two) times daily before a meal. 360 tablet 1  . insulin glargine (LANTUS SOLOSTAR) 100 UNIT/ML Solostar Pen Inject 22 Units into the skin daily. Titrate by 2 units every 3rd day to a until blood sugar is at goal. Max daily dose 30 units. 5 pen 3  . liraglutide (VICTOZA) 18 MG/3ML SOPN Inject 0.3 mLs (1.8 mg total) into the skin daily. 9 mL 6  . losartan (COZAAR) 100 MG tablet Take 1 tablet (100 mg total) by mouth daily. 90 tablet 1  . simvastatin (ZOCOR) 40 MG tablet TAKE 1 TABLET BY MOUTH EVERY DAY AT 6 PM 90 tablet 1  . fluconazole (DIFLUCAN) 150 MG tablet Take one tablet by mouth now and repeat in 3 days. Then 1 tablet weekly as needed. (Patient not taking: Reported on 12/06/2019) 15 tablet 0   No facility-administered medications prior to visit.     ROS Review of Systems  Constitutional: Negative for activity change, appetite change and fatigue.  HENT: Negative for congestion, sinus pressure and  sore throat.   Eyes: Negative for visual disturbance.  Respiratory: Negative for cough, chest tightness, shortness of breath and wheezing.   Cardiovascular: Negative for chest pain and palpitations.  Gastrointestinal: Negative for abdominal distention, abdominal pain and constipation.  Endocrine: Negative for polydipsia.  Genitourinary: Negative for dysuria and frequency.  Musculoskeletal: Negative for arthralgias and back pain.  Skin: Negative for rash.  Neurological: Negative for tremors, light-headedness and numbness.  Hematological: Does not bruise/bleed easily.  Psychiatric/Behavioral: Negative for agitation and behavioral problems.    Objective:  BP 126/82    Pulse (!) 102   Ht '5\' 7"'  (1.702 m)   Wt 283 lb (128.4 kg)   SpO2 98%   BMI 44.32 kg/m   BP/Weight 12/06/2019 11/28/2019 07/10/5613  Systolic BP 379 432 761  Diastolic BP 82 80 80  Wt. (Lbs) 283 - -  BMI 44.32 - -      Physical Exam Constitutional:      Appearance: She is well-developed. She is obese.  Neck:     Vascular: No JVD.  Cardiovascular:     Rate and Rhythm: Tachycardia present.     Heart sounds: Normal heart sounds. No murmur heard.   Pulmonary:     Effort: Pulmonary effort is normal.     Breath sounds: Normal breath sounds. No wheezing or rales.  Chest:     Chest wall: No tenderness.  Abdominal:     General: Bowel sounds are normal. There is no distension.     Palpations: Abdomen is soft. There is no mass.     Tenderness: There is no abdominal tenderness.  Musculoskeletal:        General: Normal range of motion.     Right lower leg: No edema.     Left lower leg: No edema.  Neurological:     Mental Status: She is alert and oriented to person, place, and time.  Psychiatric:        Mood and Affect: Mood normal.     CMP Latest Ref Rng & Units 06/06/2019 11/26/2017 07/21/2017  Glucose 65 - 99 mg/dL 209(H) 216(H) 278(H)  BUN 6 - 24 mg/dL '9 8 7  ' Creatinine 0.57 - 1.00 mg/dL 0.90 0.81 0.72  Sodium 134 - 144 mmol/L 137 136 135  Potassium 3.5 - 5.2 mmol/L 4.3 4.1 3.9  Chloride 96 - 106 mmol/L 99 99 104  CO2 20 - 29 mmol/L 22 20 21(L)  Calcium 8.7 - 10.2 mg/dL 10.2 9.2 8.2(L)  Total Protein 6.0 - 8.5 g/dL 7.6 6.7 6.7  Total Bilirubin 0.0 - 1.2 mg/dL 0.4 0.5 0.7  Alkaline Phos 39 - 117 IU/L 67 63 63  AST 0 - 40 IU/L 25 12 42(H)  ALT 0 - 32 IU/L 32 18 40    Lipid Panel     Component Value Date/Time   CHOL 189 06/06/2019 1128   TRIG 209 (H) 06/06/2019 1128   HDL 51 06/06/2019 1128   CHOLHDL 3.7 06/06/2019 1128   CHOLHDL 5.4 (H) 10/07/2016 1131   VLDL 55 (H) 10/07/2016 1131   LDLCALC 102 (H) 06/06/2019 1128    CBC    Component Value Date/Time   WBC  8.4 07/21/2017 1230   RBC 5.45 (H) 07/21/2017 1230   HGB 13.8 07/21/2017 1230   HCT 42.7 07/21/2017 1230   PLT 238 07/21/2017 1230   MCV 78.3 07/21/2017 1230   MCH 25.3 (L) 07/21/2017 1230   MCHC 32.3 07/21/2017 1230   RDW 12.6 07/21/2017 1230  LYMPHSABS 2,592 10/07/2016 1131   MONOABS 672 10/07/2016 1131   EOSABS 96 10/07/2016 1131   BASOSABS 0 10/07/2016 1131    Lab Results  Component Value Date   HGBA1C 11.7 (A) 12/06/2019    Assessment & Plan:  1. Type 2 diabetes mellitus with hyperglycemia, without long-term current use of insulin (HCC) Uncontrolled with A1c of 11.7; goal is less than 7.0 Increased Lantus to 35 units and advised to uptitrate by 2 units every third day Discussed initiating procedure however she is not open to doing so due to possible side effects of yeast infections Counseled on Diabetic diet, my plate method, 384 minutes of moderate intensity exercise/week Blood sugar logs with fasting goals of 80-120 mg/dl, random of less than 180 and in the event of sugars less than 60 mg/dl or greater than 400 mg/dl encouraged to notify the clinic. Advised on the need for annual eye exams, annual foot exams, Pneumonia vaccine. - POCT glucose (manual entry) - POCT glycosylated hemoglobin (Hb A1C) - insulin glargine (LANTUS SOLOSTAR) 100 UNIT/ML Solostar Pen; Inject 35 Units into the skin daily. Titrate by 2 units every 3rd day to a until blood sugar is at goal. Max daily dose 45 units.  Dispense: 30 mL; Refill: 6 - Basic Metabolic Panel - simvastatin (ZOCOR) 40 MG tablet; TAKE 1 TABLET BY MOUTH EVERY DAY AT 6 PM  Dispense: 90 tablet; Refill: 1 - liraglutide (VICTOZA) 18 MG/3ML SOPN; Inject 1.8 mg into the skin daily.  Dispense: 9 mL; Refill: 6 - glipiZIDE (GLUCOTROL) 10 MG tablet; Take 2 tablets (20 mg total) by mouth 2 (two) times daily before a meal.  Dispense: 360 tablet; Refill: 1  2. Essential hypertension Controlled Counseled on blood pressure goal of less than  130/80, low-sodium, DASH diet, medication compliance, 150 minutes of moderate intensity exercise per week. Discussed medication compliance, adverse effects. - losartan (COZAAR) 100 MG tablet; Take 1 tablet (100 mg total) by mouth daily.  Dispense: 90 tablet; Refill: 1  3. Encounter for screening mammogram for malignant neoplasm of breast - MM 3D SCREEN BREAST BILATERAL; Future  4. Need for hepatitis C screening test - HCV RNA quant rflx ultra or genotyp([facility])    Meds ordered this encounter  Medications  . insulin glargine (LANTUS SOLOSTAR) 100 UNIT/ML Solostar Pen    Sig: Inject 35 Units into the skin daily. Titrate by 2 units every 3rd day to a until blood sugar is at goal. Max daily dose 45 units.    Dispense:  30 mL    Refill:  6  . simvastatin (ZOCOR) 40 MG tablet    Sig: TAKE 1 TABLET BY MOUTH EVERY DAY AT 6 PM    Dispense:  90 tablet    Refill:  1  . losartan (COZAAR) 100 MG tablet    Sig: Take 1 tablet (100 mg total) by mouth daily.    Dispense:  90 tablet    Refill:  1  . liraglutide (VICTOZA) 18 MG/3ML SOPN    Sig: Inject 1.8 mg into the skin daily.    Dispense:  9 mL    Refill:  6  . glipiZIDE (GLUCOTROL) 10 MG tablet    Sig: Take 2 tablets (20 mg total) by mouth 2 (two) times daily before a meal.    Dispense:  360 tablet    Refill:  1    Dose change    Follow-up: Return in about 3 months (around 03/07/2020) for Chronic disease management.  Charlott Rakes, MD, FAAFP. [facility] [place], [place]   12/06/2019, 3:34 PM

## 2019-12-08 ENCOUNTER — Telehealth: Payer: Self-pay

## 2019-12-08 LAB — HCV RNA QUANT RFLX ULTRA OR GENOTYP: HCV Quant Baseline: NOT DETECTED IU/mL

## 2019-12-08 LAB — BASIC METABOLIC PANEL
BUN/Creatinine Ratio: 11 (ref 9–23)
BUN: 8 mg/dL (ref 6–24)
CO2: 23 mmol/L (ref 20–29)
Calcium: 9.4 mg/dL (ref 8.7–10.2)
Chloride: 102 mmol/L (ref 96–106)
Creatinine, Ser: 0.73 mg/dL (ref 0.57–1.00)
GFR calc Af Amer: 118 mL/min/{1.73_m2} (ref >59)
GFR calc non Af Amer: 103 mL/min/{1.73_m2} (ref >59)
Glucose: 245 mg/dL — ABNORMAL HIGH (ref 65–99)
Potassium: 4.7 mmol/L (ref 3.5–5.2)
Sodium: 139 mmol/L (ref 134–144)

## 2019-12-08 NOTE — Telephone Encounter (Signed)
Patient name and DOB has been verified Patient was informed of lab results. Patient had no questions.  

## 2019-12-08 NOTE — Telephone Encounter (Signed)
-----   Message from Hoy Register, MD sent at 12/08/2019  8:32 AM EDT ----- Labs reveal elevated glucose but given we had adjusted her regimen at her last visit no additional changes need to be made at this time.

## 2020-01-01 ENCOUNTER — Other Ambulatory Visit: Payer: Self-pay

## 2020-01-01 DIAGNOSIS — I10 Essential (primary) hypertension: Secondary | ICD-10-CM

## 2020-01-01 MED ORDER — LOSARTAN POTASSIUM 100 MG PO TABS: 100.0000 mg | ORAL_TABLET | Freq: Every day | ORAL | 1 refills | Status: DC

## 2020-02-29 ENCOUNTER — Ambulatory Visit: Payer: PRIVATE HEALTH INSURANCE | Admitting: Dietician

## 2020-03-06 ENCOUNTER — Other Ambulatory Visit: Payer: Self-pay

## 2020-03-06 ENCOUNTER — Ambulatory Visit: Payer: PRIVATE HEALTH INSURANCE | Attending: Family Medicine | Admitting: Family Medicine

## 2020-03-06 VITALS — BP 140/85 | HR 89 | Ht 67.0 in | Wt 287.0 lb

## 2020-03-06 DIAGNOSIS — I10 Essential (primary) hypertension: Secondary | ICD-10-CM | POA: Diagnosis not present

## 2020-03-06 DIAGNOSIS — E1169 Type 2 diabetes mellitus with other specified complication: Secondary | ICD-10-CM

## 2020-03-06 DIAGNOSIS — E785 Hyperlipidemia, unspecified: Secondary | ICD-10-CM

## 2020-03-06 DIAGNOSIS — E1165 Type 2 diabetes mellitus with hyperglycemia: Secondary | ICD-10-CM

## 2020-03-06 LAB — POCT GLYCOSYLATED HEMOGLOBIN (HGB A1C): HbA1c, POC (controlled diabetic range): 11.2 % — AB (ref 0.0–7.0)

## 2020-03-06 LAB — GLUCOSE, POCT (MANUAL RESULT ENTRY): POC Glucose: 220 mg/dl — AB (ref 70–99)

## 2020-03-06 MED ORDER — LANTUS SOLOSTAR 100 UNIT/ML ~~LOC~~ SOPN: 45.0000 [IU] | PEN_INJECTOR | Freq: Every day | SUBCUTANEOUS | 6 refills | Status: DC

## 2020-03-06 NOTE — Patient Instructions (Signed)

## 2020-03-06 NOTE — Progress Notes (Signed)
Subjective:  Patient ID: Lynn Anderson, female    DOB: [birth year]  Age: 40 y.o. MRN: [id number]  CC: No chief complaint on file.   HPI Lynn Anderson  is a40yo female with a medical history of Type 2DM (A1c 11.2). Hyperlipidemia, Hypertension, and obesity that comes into today for her follow up for her chronic medical conditions.  She has been taking 40 units of Lantus and she uptitrated her insulin from 35 units previously up to 40 units.  Fasting sugar this morning was 179.  Denies presence of hypoglycemia, numbness in extremities.  She goes walking daily with her dog and gets a lot of exercise at work as she works in the [place]. Compliant with her antihypertensive and her statin.  Past Medical History:  Diagnosis Date  . Diabetes mellitus without complication ([place])   . Hyperlipidemia   . Hypertension     Past Surgical History:  Procedure Laterality Date  . CHOLECYSTECTOMY    . Mirena      Inserted 10-16-14    Family History  Problem Relation Age of Onset  . Hyperlipidemia Mother   . Hypertension Mother   . Diabetes Mother   . Diabetes Maternal Grandmother   . Heart disease Father   . Diabetes Maternal Aunt     Allergies  Allergen Reactions  . Metformin And Related Diarrhea    Outpatient Medications Prior to Visit  Medication Sig Dispense Refill  . Blood Glucose Monitoring Suppl (ONE TOUCH ULTRA 2) w/Device KIT 1 each by Does not apply route 3 (three) times daily. 1 each 0  . glipiZIDE (GLUCOTROL) 10 MG tablet Take 2 tablets (20 mg total) by mouth 2 (two) times daily before a meal. 360 tablet 1  . glucose blood (ONETOUCH VERIO) test strip Use to test blood sugar 2 times daily as instructed 100 each 2  . ibuprofen (ADVIL) 800 MG tablet Take 1 tablet (800 mg total) by mouth 3 (three) times daily. 21 tablet 0  . Insulin Pen Needle (TRUEPLUS PEN NEEDLES) 32G X 4 MM MISC Use as directed once daily to administer victoza and Lantus 100 each 3  . Lancets (ONETOUCH  ULTRASOFT) lancets Use as instructed 100 each 12  . levonorgestrel (MIRENA) 20 MCG/24HR IUD 1 each by Intrauterine route once.    . liraglutide (VICTOZA) 18 MG/3ML SOPN Inject 1.8 mg into the skin daily. 9 mL 6  . losartan (COZAAR) 100 MG tablet Take 1 tablet (100 mg total) by mouth daily. 90 tablet 1  . ONETOUCH DELICA LANCETS FINE MISC Use to test blood sugar 2 times daily as instructed. 100 each 2  . simvastatin (ZOCOR) 40 MG tablet TAKE 1 TABLET BY MOUTH EVERY DAY AT 6 PM 90 tablet 1  . insulin glargine (LANTUS SOLOSTAR) 100 UNIT/ML Solostar Pen Inject 35 Units into the skin daily. Titrate by 2 units every 3rd day to a until blood sugar is at goal. Max daily dose 45 units. 30 mL 6   No facility-administered medications prior to visit.     ROS Review of Systems  Constitutional: Negative for activity change, appetite change and fatigue.  HENT: Negative for congestion, sinus pressure and sore throat.   Eyes: Negative for visual disturbance.  Respiratory: Negative for cough, chest tightness, shortness of breath and wheezing.   Cardiovascular: Negative for chest pain and palpitations.  Gastrointestinal: Negative for abdominal distention, abdominal pain and constipation.  Endocrine: Negative for polydipsia.  Genitourinary: Negative for dysuria and frequency.  Musculoskeletal: Negative  for arthralgias and back pain.  Skin: Negative for rash.  Neurological: Negative for tremors, light-headedness and numbness.  Hematological: Does not bruise/bleed easily.  Psychiatric/Behavioral: Negative for agitation and behavioral problems.    Objective:  BP 140/85   Pulse 89   Ht _0  (1.702 m)   Wt 287 lb (130.2 kg)   SpO2 100%   BMI 44.95 kg/m   BP/Weight 03/06/2020 12/06/2019 96/75/9163  Systolic BP 846 659 935  Diastolic BP 85 82 80  Wt. (Lbs) 287 283 -  BMI 44.95 44.32 -      Physical Exam Constitutional:      Appearance: She is well-developed.  Neck:     Vascular: No JVD.   Cardiovascular:     Rate and Rhythm: Normal rate.     Heart sounds: Normal heart sounds. No murmur heard.   Pulmonary:     Effort: Pulmonary effort is normal.     Breath sounds: Normal breath sounds. No wheezing or rales.  Chest:     Chest wall: No tenderness.  Abdominal:     General: Bowel sounds are normal. There is no distension.     Palpations: Abdomen is soft. There is no mass.     Tenderness: There is no abdominal tenderness.  Musculoskeletal:        General: Normal range of motion.     Right lower leg: No edema.     Left lower leg: No edema.  Neurological:     Mental Status: She is alert and oriented to person, place, and time.  Psychiatric:        Mood and Affect: Mood normal.     CMP Latest Ref Rng & Units 12/06/2019 06/06/2019 11/26/2017  Glucose 65 - 99 mg/dL 245(H) 209(H) 216(H)  BUN 6 - 24 mg/dL _1 Creatinine 0.57 - 1.00 mg/dL 0.73 0.90 0.81  Sodium 134 - 144 mmol/L 139 137 136  Potassium 3.5 - 5.2 mmol/L 4.7 4.3 4.1  Chloride 96 - 106 mmol/L 102 99 99  CO2 20 - 29 mmol/L _2 Calcium 8.7 - 10.2 mg/dL 9.4 10.2 9.2  Total Protein 6.0 - 8.5 g/dL - 7.6 6.7  Total Bilirubin 0.0 - 1.2 mg/dL - 0.4 0.5  Alkaline Phos 39 - 117 IU/L - 67 63  AST 0 - 40 IU/L - 25 12  ALT 0 - 32 IU/L - 32 18    Lipid Panel     Component Value Date/Time   CHOL 189 06/06/2019 1128   TRIG 209 (H) 06/06/2019 1128   HDL 51 06/06/2019 1128   CHOLHDL 3.7 06/06/2019 1128   CHOLHDL 5.4 (H) 10/07/2016 1131   VLDL 55 (H) 10/07/2016 1131   LDLCALC 102 (H) 06/06/2019 1128    CBC    Component Value Date/Time   WBC 8.4 07/21/2017 1230   RBC 5.45 (H) 07/21/2017 1230   HGB 13.8 07/21/2017 1230   HCT 42.7 07/21/2017 1230   PLT 238 07/21/2017 1230   MCV 78.3 07/21/2017 1230   MCH 25.3 (L) 07/21/2017 1230   MCHC 32.3 07/21/2017 1230   RDW 12.6 07/21/2017 1230   LYMPHSABS 2,592 10/07/2016 1131   MONOABS 672 10/07/2016 1131   EOSABS 96 10/07/2016 1131   BASOSABS 0 10/07/2016  1131    Lab Results  Component Value Date   HGBA1C 11.2 (A) 03/06/2020    Assessment & Plan:  1. Type 2 diabetes mellitus with hyperglycemia, without long-term current use of insulin (HCC) Uncontrolled  with A1c of 11.2; goal is less than 7.0 Increase Lantus dose advised to titrate by 2 units every fourth day until blood sugars are at goal We have discussed referral to endocrinology which she is not open to given she had seen them in the past and had huge medical bills from her visits. Consider mealtime insulin at next visit if still uncontrolled Counseled on Diabetic diet, my plate method, 034 minutes of moderate intensity exercise/week Blood sugar logs with fasting goals of 80-120 mg/dl, random of less than 180 and in the event of sugars less than 60 mg/dl or greater than 400 mg/dl encouraged to notify the clinic. Advised on the need for annual eye exams, annual foot exams, Pneumonia vaccine. - POCT glucose (manual entry) - POCT glycosylated hemoglobin (Hb A1C) - insulin glargine (LANTUS SOLOSTAR) 100 UNIT/ML Solostar Pen; Inject 45 Units into the skin daily. Titrate by 2 units every 3rd day to a until blood sugar is at goal. Max daily dose 55 units.  Dispense: 30 mL; Refill: 6  2. Essential hypertension Slightly elevated compared to last visit No regimen change today Counseled on blood pressure goal of less than 130/80, low-sodium, DASH diet, medication compliance, 150 minutes of moderate intensity exercise per week. Discussed medication compliance, adverse effects.  3. Hyperlipidemia associated with type 2 diabetes mellitus ([place]) LDL of 102 slightly above goal of less than 100 Lipid panel at next visit Continue statin Low-cholesterol diet    Meds ordered this encounter  Medications  . insulin glargine (LANTUS SOLOSTAR) 100 UNIT/ML Solostar Pen    Sig: Inject 45 Units into the skin daily. Titrate by 2 units every 3rd day to a until blood sugar is at goal. Max daily dose 55  units.    Dispense:  30 mL    Refill:  6    Dose change    Follow-up: Return in about 3 months (around 06/04/2020) for Diabetes mellitus.       Charlott Rakes, MD, FAAFP. [facility] and [facility], [place]   03/06/2020, 5:18 PM

## 2020-03-19 ENCOUNTER — Encounter: Payer: Self-pay | Admitting: Dietician

## 2020-03-19 ENCOUNTER — Other Ambulatory Visit: Payer: Self-pay

## 2020-03-19 ENCOUNTER — Encounter: Payer: PRIVATE HEALTH INSURANCE | Attending: Family Medicine | Admitting: Dietician

## 2020-03-19 DIAGNOSIS — E1165 Type 2 diabetes mellitus with hyperglycemia: Secondary | ICD-10-CM | POA: Diagnosis present

## 2020-03-19 NOTE — Patient Instructions (Addendum)
Consider checking your blood sugar 2 hours after a meal. Try checking it after you eat grits, bread, rice, or pasta.  Get back into walking your dog as the weather warms up.  Measure out 1/3 cup of pasta the next time you make some to recognize what a serving size is visually.   Try plant based pastas BUT only boil it for 2-3 minutes, then finish it by sauteing it with a little oil.

## 2020-03-19 NOTE — Progress Notes (Signed)
Diabetes Self-Management Education  Visit Type: Follow-up  Appt. Start Time: 1445 Appt. End Time: 1520  03/19/2020  [name], identified by name and date of birth, is a 40 y.o. female with a diagnosis of Diabetes:  .   ASSESSMENT Pt reports frustration with providers not seeing their progress as good enough. Pt reports taking a lot of medication they don't want to and would like to see better results for all the meds they are on.  Pt reports an increase in their Lantus dose from 35 to 42 units. Pt reports checking blood sugar every other day fasting in the morning, pt states FBG is usually around 140.  Pt reports not following their dietary plan when working on the weekends. States they only usually eat 1-2 meals, or snacks throughout the day. Pt will be working 3 straight 12 hour shifts, 7-7 overnights. Pt reports snacking on yogurt, canned fruit, bologna sandwich, cereal. Pt reports when they pack meals it's usually junk food. Pt reports when they are not working they will get in more meals, eats breakfast more consistently. Pt reports eating grits before their last A1c and was worried they would spike their BG. Pt reports having better energy level when they go to bed around 10:00 pm they will get up earlier and have more energy. Pt reports walking more at work, and has plans to walk their dog more when the weather warms up.  Height 5\' 7"  (1.702 m), weight 284 lb 1.6 oz (128.9 kg). Body mass index is 44.5 kg/m.   Diabetes Self-Management Education - 03/19/20 1509      Visit Information   Visit Type Follow-up      Pre-Education Assessment   Patient understands the diabetes disease and treatment process. Needs Review    Patient understands incorporating nutritional management into lifestyle. Needs Review    Patient undertands incorporating physical activity into lifestyle. Needs Review    Patient understands using medications safely. Needs Review    Patient understands  monitoring blood glucose, interpreting and using results Needs Review    Patient understands prevention, detection, and treatment of acute complications. Needs Review    Patient understands prevention, detection, and treatment of chronic complications. Needs Review    Patient understands how to develop strategies to address psychosocial issues. Needs Review    Patient understands how to develop strategies to promote health/change behavior. Needs Review      Complications   Last HgB A1C per patient/outside source 11.2 %   02/19/2020   Fasting Blood glucose range (mg/dL) 04/18/2020      Dietary Intake   Breakfast Bologna, mayo, white wheat bread, water, 2% milk 1 cup    Snack (morning) lemon meringue jello cup    Lunch sausage, egg, and cheese English muffin [place]    Snack (afternoon) none    Dinner none    Snack (evening) none    Beverage(s) Water, 2% milk      Exercise   Exercise Type ADL's    How many days per week to you exercise? 0    How many minutes per day do you exercise? 0    Total minutes per week of exercise 0      Individualized Goals (developed by patient)   Medications take my medication as prescribed      Patient Self-Evaluation of Goals - Patient rates self as meeting previously set goals (% of time)   Nutrition < 25%    Physical Activity < 25%  Medications >75%    Monitoring 25 - 50%    Problem Solving 25 - 50%    Reducing Risk 25 - 50%    Health Coping 25 - 50%      Post-Education Assessment   Patient understands the diabetes disease and treatment process. Needs Review    Patient understands incorporating nutritional management into lifestyle. Needs Review    Patient undertands incorporating physical activity into lifestyle. Needs Review    Patient understands using medications safely. Needs Review    Patient understands monitoring blood glucose, interpreting and using results Needs Review    Patient understands prevention, detection, and treatment of  acute complications. Needs Review    Patient understands prevention, detection, and treatment of chronic complications. Needs Review    Patient understands how to develop strategies to address psychosocial issues. Needs Review    Patient understands how to develop strategies to promote health/change behavior. Needs Review      Outcomes   Expected Outcomes Demonstrated limited interest in learning.  Expect minimal changes    Future DMSE 3-4 months    Program Status Not Completed      Subsequent Visit   Since your last visit have you continued or begun to take your medications as prescribed? Yes    Since your last visit have you experienced any weight changes? No change    Since your last visit, are you checking your blood glucose at least once a day? No   Pt checking FBG every other day          Individualized Plan for Diabetes Self-Management Training:   Learning Objective:  Patient will have a greater understanding of diabetes self-management. Patient education plan is to attend individual and/or group sessions per assessed needs and concerns.   Plan:   Patient Instructions  Consider checking your blood sugar 2 hours after a meal. Try checking it after you eat grits, bread, rice, or pasta.  Get back into walking your dog as the weather warms up.  Measure out 1/3 cup of pasta the next time you make some to recognize what a serving size is visually.   Try plant based pastas BUT only boil it for 2-3 minutes, then finish it by sauteing it with a little oil.   Expected Outcomes:  Demonstrated limited interest in learning.  Expect minimal changes   If problems or questions, patient to contact team via:  Phone and Email  Future DSME appointment: 3-4 months

## 2020-06-05 ENCOUNTER — Encounter: Payer: Self-pay | Admitting: Family Medicine

## 2020-06-05 ENCOUNTER — Other Ambulatory Visit: Payer: Self-pay

## 2020-06-05 ENCOUNTER — Ambulatory Visit: Payer: PRIVATE HEALTH INSURANCE | Attending: Family Medicine | Admitting: Family Medicine

## 2020-06-05 VITALS — BP 148/89 | HR 87 | Ht 67.0 in | Wt 287.0 lb

## 2020-06-05 DIAGNOSIS — Z23 Encounter for immunization: Secondary | ICD-10-CM

## 2020-06-05 DIAGNOSIS — E1159 Type 2 diabetes mellitus with other circulatory complications: Secondary | ICD-10-CM | POA: Diagnosis not present

## 2020-06-05 DIAGNOSIS — E1165 Type 2 diabetes mellitus with hyperglycemia: Secondary | ICD-10-CM

## 2020-06-05 DIAGNOSIS — I152 Hypertension secondary to endocrine disorders: Secondary | ICD-10-CM

## 2020-06-05 LAB — POCT GLYCOSYLATED HEMOGLOBIN (HGB A1C): HbA1c, POC (controlled diabetic range): 11.7 % — AB (ref 0.0–7.0)

## 2020-06-05 LAB — GLUCOSE, POCT (MANUAL RESULT ENTRY): POC Glucose: 259 mg/dl — AB (ref 70–99)

## 2020-06-05 MED ORDER — OZEMPIC (0.25 OR 0.5 MG/DOSE) 2 MG/1.5ML ~~LOC~~ SOPN: 0.5000 mg | PEN_INJECTOR | SUBCUTANEOUS | 2 refills | Status: DC

## 2020-06-05 MED ORDER — LOSARTAN POTASSIUM 100 MG PO TABS: 100.0000 mg | ORAL_TABLET | Freq: Every day | ORAL | 1 refills | Status: DC

## 2020-06-05 MED ORDER — SIMVASTATIN 40 MG PO TABS: ORAL_TABLET | ORAL | 1 refills | Status: DC

## 2020-06-05 MED ORDER — LANTUS SOLOSTAR 100 UNIT/ML ~~LOC~~ SOPN: 45.0000 [IU] | PEN_INJECTOR | Freq: Every day | SUBCUTANEOUS | 6 refills | Status: DC

## 2020-06-05 MED ORDER — GLIPIZIDE 10 MG PO TABS: 20.0000 mg | ORAL_TABLET | Freq: Two times a day (BID) | ORAL | 1 refills | Status: DC

## 2020-06-05 MED ORDER — DAPAGLIFLOZIN PROPANEDIOL 10 MG PO TABS: 10.0000 mg | ORAL_TABLET | Freq: Every day | ORAL | 6 refills | Status: DC

## 2020-06-05 NOTE — Progress Notes (Signed)
Subjective:  Patient ID: Lynn Anderson, female    DOB: 03-16-1980  Age: 40 y.o. MRN: [id number]  CC: Diabetes   HPI Lynn Anderson is a25yo female with a medical history of Type 2DM (A1c 11.7). Hyperlipidemia, Hypertension, and obesity that comes into today for her follow up for her chronic medical conditions. Her A1c is still elevated at 11.7, up from 11.2 previously and she has not been up titrating her Lantus dose as discussed at her last visit based on her blood sugar.  She endorses drinking going on a binge where she drinks also sweet drinks.  Has been compliant with her medications. We had discussed endocrine referral which she had been opposed to due to huge bills from endocrinologist received in the past. She has been compliant with Victoza, glipizide as well as her Lantus. Denies hypoglycemic episodes, numbness in extremities and has been to see ophthalmology for yearly exam which occurred in the fall of last year. Past Medical History:  Diagnosis Date  . Diabetes mellitus without complication (HCC)   . Hyperlipidemia   . Hypertension     Past Surgical History:  Procedure Laterality Date  . CHOLECYSTECTOMY    . Mirena      Inserted 10-16-14    Family History  Problem Relation Age of Onset  . Hyperlipidemia Mother   . Hypertension Mother   . Diabetes Mother   . Diabetes Maternal Grandmother   . Heart disease Father   . Diabetes Maternal Aunt     Allergies  Allergen Reactions  . Metformin And Related Diarrhea    Outpatient Medications Prior to Visit  Medication Sig Dispense Refill  . Blood Glucose Monitoring Suppl (ONE TOUCH ULTRA 2) w/Device KIT 1 each by Does not apply route 3 (three) times daily. 1 each 0  . glucose blood (ONETOUCH VERIO) test strip Use to test blood sugar 2 times daily as instructed 100 each 2  . ibuprofen (ADVIL) 800 MG tablet Take 1 tablet (800 mg total) by mouth 3 (three) times daily. 21 tablet 0  . Insulin Pen Needle (TRUEPLUS  PEN NEEDLES) 32G X 4 MM MISC Use as directed once daily to administer victoza and Lantus 100 each 3  . Lancets (ONETOUCH ULTRASOFT) lancets Use as instructed 100 each 12  . levonorgestrel (MIRENA) 20 MCG/24HR IUD 1 each by Intrauterine route once.    Letta Pate DELICA LANCETS FINE MISC Use to test blood sugar 2 times daily as instructed. 100 each 2  . glipiZIDE (GLUCOTROL) 10 MG tablet Take 2 tablets (20 mg total) by mouth 2 (two) times daily before a meal. 360 tablet 1  . insulin glargine (LANTUS SOLOSTAR) 100 UNIT/ML Solostar Pen Inject 45 Units into the skin daily. Titrate by 2 units every 3rd day to a until blood sugar is at goal. Max daily dose 55 units. 30 mL 6  . liraglutide (VICTOZA) 18 MG/3ML SOPN Inject 1.8 mg into the skin daily. 9 mL 6  . losartan (COZAAR) 100 MG tablet Take 1 tablet (100 mg total) by mouth daily. 90 tablet 1  . simvastatin (ZOCOR) 40 MG tablet TAKE 1 TABLET BY MOUTH EVERY DAY AT 6 PM 90 tablet 1   No facility-administered medications prior to visit.     ROS Review of Systems  Constitutional: Negative for activity change, appetite change and fatigue.  HENT: Negative for congestion, sinus pressure and sore throat.   Eyes: Negative for visual disturbance.  Respiratory: Negative for cough, chest tightness, shortness of  breath and wheezing.   Cardiovascular: Negative for chest pain and palpitations.  Gastrointestinal: Negative for abdominal distention, abdominal pain and constipation.  Endocrine: Negative for polydipsia.  Genitourinary: Negative for dysuria and frequency.  Musculoskeletal: Negative for arthralgias and back pain.  Skin: Negative for rash.  Neurological: Negative for tremors, light-headedness and numbness.  Hematological: Does not bruise/bleed easily.  Psychiatric/Behavioral: Negative for agitation and behavioral problems.    Objective:  BP (!) 148/89   Pulse 87   Ht _0  (1.702 m)   Wt 287 lb (130.2 kg)   SpO2 100%   BMI 44.95 kg/m    BP/Weight 06/05/2020 03/19/2020 05/18/8117  Systolic BP 147 - 829  Diastolic BP 89 - 85  Wt. (Lbs) 287 284.1 287  BMI 44.95 44.5 44.95      Physical Exam Constitutional:      Appearance: She is well-developed.  Neck:     Vascular: No JVD.  Cardiovascular:     Rate and Rhythm: Normal rate.     Heart sounds: Normal heart sounds. No murmur heard.   Pulmonary:     Effort: Pulmonary effort is normal.     Breath sounds: Normal breath sounds. No wheezing or rales.  Chest:     Chest wall: No tenderness.  Abdominal:     General: Bowel sounds are normal. There is no distension.     Palpations: Abdomen is soft. There is no mass.     Tenderness: There is no abdominal tenderness.  Musculoskeletal:        General: Normal range of motion.     Right lower leg: No edema.     Left lower leg: No edema.  Neurological:     Mental Status: She is alert and oriented to person, place, and time.  Psychiatric:        Mood and Affect: Mood normal.       Diabetic Foot Exam - Simple   Simple Foot Form Diabetic Foot exam was performed with the following findings: Yes 06/05/2020 10:47 AM  Visual Inspection No deformities, no ulcerations, no other skin breakdown bilaterally: Yes Sensation Testing Intact to touch and monofilament testing bilaterally: Yes Pulse Check Posterior Tibialis and Dorsalis pulse intact bilaterally: Yes Comments     CMP Latest Ref Rng & Units 12/06/2019 06/06/2019 11/26/2017  Glucose 65 - 99 mg/dL 245(H) 209(H) 216(H)  BUN 6 - 24 mg/dL _1 Creatinine 0.57 - 1.00 mg/dL 0.73 0.90 0.81  Sodium 134 - 144 mmol/L 139 137 136  Potassium 3.5 - 5.2 mmol/L 4.7 4.3 4.1  Chloride 96 - 106 mmol/L 102 99 99  CO2 20 - 29 mmol/L _2 Calcium 8.7 - 10.2 mg/dL 9.4 10.2 9.2  Total Protein 6.0 - 8.5 g/dL - 7.6 6.7  Total Bilirubin 0.0 - 1.2 mg/dL - 0.4 0.5  Alkaline Phos 39 - 117 IU/L - 67 63  AST 0 - 40 IU/L - 25 12  ALT 0 - 32 IU/L - 32 18    Lipid Panel      Component Value Date/Time   CHOL 189 06/06/2019 1128   TRIG 209 (H) 06/06/2019 1128   HDL 51 06/06/2019 1128   CHOLHDL 3.7 06/06/2019 1128   CHOLHDL 5.4 (H) 10/07/2016 1131   VLDL 55 (H) 10/07/2016 1131   LDLCALC 102 (H) 06/06/2019 1128    CBC    Component Value Date/Time   WBC 8.4 07/21/2017 1230   RBC 5.45 (H) 07/21/2017 1230   HGB  13.8 07/21/2017 1230   HCT 42.7 07/21/2017 1230   PLT 238 07/21/2017 1230   MCV 78.3 07/21/2017 1230   MCH 25.3 (L) 07/21/2017 1230   MCHC 32.3 07/21/2017 1230   RDW 12.6 07/21/2017 1230   LYMPHSABS 2,592 10/07/2016 1131   MONOABS 672 10/07/2016 1131   EOSABS 96 10/07/2016 1131   BASOSABS 0 10/07/2016 1131    Lab Results  Component Value Date   HGBA1C 11.7 (A) 06/05/2020    Assessment & Plan:  1. Type 2 diabetes mellitus with hyperglycemia, without long-term current use of insulin ([place]) Uncontrolled with A1c of 11.7 which is above goal of less than 7.0 Noncompliance with diet and increase in sugary drinks could be contributory [place] added to regimen Will substitute Victoza with Ozempic due to increased weight loss benefit with the latter and we will titrate up at her next office visit This note has been created with Dragon speech recognition software and Engineer, materials. Any transcriptional errors are unintentional.   , - POCT glucose (manual entry) - POCT glycosylated hemoglobin (Hb A1C) - Semaglutide,0.25 or 0.5MG /DOS, (OZEMPIC, 0.25 OR 0.5 MG/DOSE,) 2 MG/1.5ML SOPN; Inject 0.5 mg into the skin once a week.  Dispense: 1.5 mL; Refill: 2 - dapagliflozin propanediol (FARXIGA) 10 MG TABS tablet; Take 1 tablet (10 mg total) by mouth daily before breakfast.  Dispense: 30 tablet; Refill: 6 - glipiZIDE (GLUCOTROL) 10 MG tablet; Take 2 tablets (20 mg total) by mouth 2 (two) times daily before a meal.  Dispense: 360 tablet; Refill: 1 - insulin glargine (LANTUS SOLOSTAR) 100 UNIT/ML Solostar Pen; Inject 45 Units into the skin daily.   Dispense: 30 mL; Refill: 6 - simvastatin (ZOCOR) 40 MG tablet; TAKE 1 TABLET BY MOUTH EVERY DAY AT 6 PM  Dispense: 90 tablet; Refill: 1 - Microalbumin / creatinine urine ratio - CMP14+EGFR - Lipid panel  2. Hypertension associated with diabetes ([place]) Uncontrolled SGLT2 comments today which might have some blood pressure lowering effect Will reassess at next visit and adjust regimen if indicated Counseled on blood pressure goal of less than 130/80, low-sodium, DASH diet, medication compliance, 150 minutes of moderate intensity exercise per week. Discussed medication compliance, adverse effects. - Basic Metabolic Panel - losartan (COZAAR) 100 MG tablet; Take 1 tablet (100 mg total) by mouth daily.  Dispense: 90 tablet; Refill: 1  3. Need for Tdap vaccination - Tdap vaccine greater than or equal to 7yo IM   Meds ordered this encounter  Medications  . Semaglutide,0.25 or 0.5MG /DOS, (OZEMPIC, 0.25 OR 0.5 MG/DOSE,) 2 MG/1.5ML SOPN    Sig: Inject 0.5 mg into the skin once a week.    Dispense:  1.5 mL    Refill:  2    Discontinue Victoza  . dapagliflozin propanediol (FARXIGA) 10 MG TABS tablet    Sig: Take 1 tablet (10 mg total) by mouth daily before breakfast.    Dispense:  30 tablet    Refill:  6  . glipiZIDE (GLUCOTROL) 10 MG tablet    Sig: Take 2 tablets (20 mg total) by mouth 2 (two) times daily before a meal.    Dispense:  360 tablet    Refill:  1  . insulin glargine (LANTUS SOLOSTAR) 100 UNIT/ML Solostar Pen    Sig: Inject 45 Units into the skin daily.    Dispense:  30 mL    Refill:  6    Dose change  . losartan (COZAAR) 100 MG tablet    Sig: Take 1 tablet (100 mg  total) by mouth daily.    Dispense:  90 tablet    Refill:  1  . simvastatin (ZOCOR) 40 MG tablet    Sig: TAKE 1 TABLET BY MOUTH EVERY DAY AT 6 PM    Dispense:  90 tablet    Refill:  1    Follow-up: Return in about 1 month (around 07/05/2020) for Follow-up on diabetes mellitus.       Charlott Rakes, MD,  FAAFP. [facility] and [place], [place]   06/05/2020, 1:44 PM

## 2020-06-05 NOTE — Patient Instructions (Signed)
Semaglutide injection solution What is this medicine? SEMAGLUTIDE (Sem a GLOO tide) is used to improve blood sugar control in adults with type 2 diabetes. This medicine may be used with other diabetes medicines. This drug may also reduce the risk of heart attack or stroke if you have type 2 diabetes and risk factors for heart disease. This medicine may be used for other purposes; ask your health care provider or pharmacist if you have questions. COMMON BRAND NAME(S): OZEMPIC What should I tell my health care provider before I take this medicine? They need to know if you have any of these conditions: endocrine tumors (MEN 2) or if someone in your family had these tumors eye disease, vision problems history of pancreatitis kidney disease stomach problems thyroid cancer or if someone in your family had thyroid cancer an unusual or allergic reaction to semaglutide, other medicines, foods, dyes, or preservatives pregnant or trying to get pregnant breast-feeding How should I use this medicine? This medicine is for injection under the skin of your upper leg (thigh), stomach area, or upper arm. It is given once every week (every 7 days). You will be taught how to prepare and give this medicine. Use exactly as directed. Take your medicine at regular intervals. Do not take it more often than directed. If you use this medicine with insulin, you should inject this medicine and the insulin separately. Do not mix them together. Do not give the injections right next to each other. Change (rotate) injection sites with each injection. It is important that you put your used needles and syringes in a special sharps container. Do not put them in a trash can. If you do not have a sharps container, call your pharmacist or healthcare provider to get one. A special MedGuide will be given to you by the pharmacist with each prescription and refill. Be sure to read this information carefully each time. This drug comes  with INSTRUCTIONS FOR USE. Ask your pharmacist for directions on how to use this drug. Read the information carefully. Talk to your pharmacist or health care provider if you have questions. Talk to your pediatrician regarding the use of this medicine in children. Special care may be needed. Overdosage: If you think you have taken too much of this medicine contact a poison control center or emergency room at once. NOTE: This medicine is only for you. Do not share this medicine with others. What if I miss a dose? If you miss a dose, take it as soon as you can within 5 days after the missed dose. Then take your next dose at your regular weekly time. If it has been longer than 5 days after the missed dose, do not take the missed dose. Take the next dose at your regular time. Do not take double or extra doses. If you have questions about a missed dose, contact your health care provider for advice. What may interact with this medicine? other medicines for diabetes Many medications may cause changes in blood sugar, these include: alcohol containing beverages antiviral medicines for HIV or AIDS aspirin and aspirin-like drugs certain medicines for blood pressure, heart disease, irregular heart beat chromium diuretics female hormones, such as estrogens or progestins, birth control pills fenofibrate gemfibrozil isoniazid lanreotide female hormones or anabolic steroids MAOIs like Carbex, Eldepryl, Marplan, Nardil, and Parnate medicines for weight loss medicines for allergies, asthma, cold, or cough medicines for depression, anxiety, or psychotic disturbances niacin nicotine NSAIDs, medicines for pain and inflammation, like ibuprofen or naproxen octreotide   pasireotide pentamidine phenytoin probenecid quinolone antibiotics such as ciprofloxacin, levofloxacin, ofloxacin some herbal dietary supplements steroid medicines such as prednisone or cortisone sulfamethoxazole; trimethoprim thyroid  hormones Some medications can hide the warning symptoms of low blood sugar (hypoglycemia). You may need to monitor your blood sugar more closely if you are taking one of these medications. These include: beta-blockers, often used for high blood pressure or heart problems (examples include atenolol, metoprolol, propranolol) clonidine guanethidine reserpine This list may not describe all possible interactions. Give your health care provider a list of all the medicines, herbs, non-prescription drugs, or dietary supplements you use. Also tell them if you smoke, drink alcohol, or use illegal drugs. Some items may interact with your medicine. What should I watch for while using this medicine? Visit your doctor or health care professional for regular checks on your progress. Drink plenty of fluids while taking this medicine. Check with your doctor or health care professional if you get an attack of severe diarrhea, nausea, and vomiting. The loss of too much body fluid can make it dangerous for you to take this medicine. A test called the HbA1C (A1C) will be monitored. This is a simple blood test. It measures your blood sugar control over the last 2 to 3 months. You will receive this test every 3 to 6 months. Learn how to check your blood sugar. Learn the symptoms of low and high blood sugar and how to manage them. Always carry a quick-source of sugar with you in case you have symptoms of low blood sugar. Examples include hard sugar candy or glucose tablets. Make sure others know that you can choke if you eat or drink when you develop serious symptoms of low blood sugar, such as seizures or unconsciousness. They must get medical help at once. Tell your doctor or health care professional if you have high blood sugar. You might need to change the dose of your medicine. If you are sick or exercising more than usual, you might need to change the dose of your medicine. Do not skip meals. Ask your doctor or health  care professional if you should avoid alcohol. Many nonprescription cough and cold products contain sugar or alcohol. These can affect blood sugar. Pens should never be shared. Even if the needle is changed, sharing may result in passing of viruses like hepatitis or HIV. Wear a medical ID bracelet or chain, and carry a card that describes your disease and details of your medicine and dosage times. Do not become pregnant while taking this medicine. Women should inform their doctor if they wish to become pregnant or think they might be pregnant. There is a potential for serious side effects to an unborn child. Talk to your health care professional or pharmacist for more information. What side effects may I notice from receiving this medicine? Side effects that you should report to your doctor or health care professional as soon as possible: allergic reactions like skin rash, itching or hives, swelling of the face, lips, or tongue breathing problems changes in vision diarrhea that continues or is severe lump or swelling on the neck severe nausea signs and symptoms of infection like fever or chills; cough; sore throat; pain or trouble passing urine signs and symptoms of low blood sugar such as feeling anxious, confusion, dizziness, increased hunger, unusually weak or tired, sweating, shakiness, cold, irritable, headache, blurred vision, fast heartbeat, loss of consciousness signs and symptoms of kidney injury like trouble passing urine or change in the amount of urine   trouble swallowing unusual stomach upset or pain vomiting Side effects that usually do not require medical attention (report to your doctor or health care professional if they continue or are bothersome): constipation diarrhea nausea pain, redness, or irritation at site where injected stomach upset This list may not describe all possible side effects. Call your doctor for medical advice about side effects. You may report side  effects to FDA at 1-800-FDA-1088. Where should I keep my medicine? Keep out of the reach of children. Store unopened pens in a refrigerator between 2 and 8 degrees C (36 and 46 degrees F). Do not freeze. Protect from light and heat. After you first use the pen, it can be stored for 56 days at room temperature between 15 and 30 degrees C (59 and 86 degrees F) or in a refrigerator. Throw away your used pen after 56 days or after the expiration date, whichever comes first. Do not store your pen with the needle attached. If the needle is left on, medicine may leak from the pen. NOTE: This sheet is a summary. It may not cover all possible information. If you have questions about this medicine, talk to your doctor, pharmacist, or health care provider.  2021 [employer] (2018-10-11 09:41:51)  

## 2020-06-06 LAB — CMP14+EGFR
ALT: 29 IU/L (ref 0–32)
AST: 19 IU/L (ref 0–40)
Albumin/Globulin Ratio: 1.5 (ref 1.2–2.2)
Albumin: 4.3 g/dL (ref 3.8–4.8)
Alkaline Phosphatase: 68 IU/L (ref 44–121)
BUN/Creatinine Ratio: 11 (ref 9–23)
BUN: 8 mg/dL (ref 6–24)
Bilirubin Total: 0.4 mg/dL (ref 0.0–1.2)
CO2: 20 mmol/L (ref 20–29)
Calcium: 9.9 mg/dL (ref 8.7–10.2)
Chloride: 105 mmol/L (ref 96–106)
Creatinine, Ser: 0.76 mg/dL (ref 0.57–1.00)
Globulin, Total: 2.9 g/dL (ref 1.5–4.5)
Glucose: 261 mg/dL — ABNORMAL HIGH (ref 65–99)
Potassium: 4.9 mmol/L (ref 3.5–5.2)
Sodium: 143 mmol/L (ref 134–144)
Total Protein: 7.2 g/dL (ref 6.0–8.5)
eGFR: 101 mL/min/{1.73_m2} (ref >59)

## 2020-06-06 LAB — LIPID PANEL
Chol/HDL Ratio: 4.4 ratio (ref 0.0–4.4)
Cholesterol, Total: 207 mg/dL — ABNORMAL HIGH (ref 100–199)
HDL: 47 mg/dL (ref >39)
LDL Chol Calc (NIH): 131 mg/dL — ABNORMAL HIGH (ref 0–99)
Triglycerides: 164 mg/dL — ABNORMAL HIGH (ref 0–149)
VLDL Cholesterol Cal: 29 mg/dL (ref 5–40)

## 2020-06-06 LAB — MICROALBUMIN / CREATININE URINE RATIO
Creatinine, Urine: 92.9 mg/dL
Microalb/Creat Ratio: 7 mg/g creat (ref 0–29)
Microalbumin, Urine: 6.5 ug/mL

## 2020-06-19 ENCOUNTER — Encounter: Payer: Self-pay | Admitting: Family Medicine

## 2020-06-19 ENCOUNTER — Other Ambulatory Visit: Payer: Self-pay | Admitting: Family Medicine

## 2020-06-19 MED ORDER — FLUCONAZOLE 150 MG PO TABS: 150.0000 mg | ORAL_TABLET | Freq: Every day | ORAL | 0 refills | Status: DC

## 2020-06-20 ENCOUNTER — Ambulatory Visit: Payer: PRIVATE HEALTH INSURANCE | Admitting: Dietician

## 2020-07-27 ENCOUNTER — Other Ambulatory Visit: Payer: Self-pay | Admitting: Family Medicine

## 2020-07-27 DIAGNOSIS — E1165 Type 2 diabetes mellitus with hyperglycemia: Secondary | ICD-10-CM

## 2020-07-27 NOTE — Telephone Encounter (Signed)
Should have enough med until 09/04/20

## 2020-08-07 ENCOUNTER — Ambulatory Visit: Payer: No Typology Code available for payment source | Attending: Family Medicine | Admitting: Family Medicine

## 2020-08-07 ENCOUNTER — Other Ambulatory Visit: Payer: Self-pay

## 2020-08-07 ENCOUNTER — Encounter: Payer: Self-pay | Admitting: Family Medicine

## 2020-08-07 VITALS — BP 136/83 | HR 104 | Ht 67.0 in | Wt 281.0 lb

## 2020-08-07 DIAGNOSIS — E1165 Type 2 diabetes mellitus with hyperglycemia: Secondary | ICD-10-CM

## 2020-08-07 LAB — GLUCOSE, POCT (MANUAL RESULT ENTRY): POC Glucose: 258 mg/dl — AB (ref 70–99)

## 2020-08-07 MED ORDER — LANTUS SOLOSTAR 100 UNIT/ML ~~LOC~~ SOPN: 55.0000 [IU] | PEN_INJECTOR | Freq: Every day | SUBCUTANEOUS | 6 refills | Status: DC

## 2020-08-07 MED ORDER — SEMAGLUTIDE (1 MG/DOSE) 4 MG/3ML ~~LOC~~ SOPN: 1.0000 mg | PEN_INJECTOR | SUBCUTANEOUS | 6 refills | Status: DC

## 2020-08-07 NOTE — Patient Instructions (Signed)
Semaglutide injection solution What is this medication? SEMAGLUTIDE (Sem a GLOO tide) is used to improve blood sugar control in adults with type 2 diabetes. This medicine may be used with other diabetes medicines. This drug may also reduce the risk of heart attack or stroke if you have type 2diabetes and risk factors for heart disease. This medicine may be used for other purposes; ask your health care provider orpharmacist if you have questions. COMMON BRAND NAME(S): OZEMPIC What should I tell my care team before I take this medication? They need to know if you have any of these conditions: endocrine tumors (MEN 2) or if someone in your family had these tumors eye disease, vision problems history of pancreatitis kidney disease stomach problems thyroid cancer or if someone in your family had thyroid cancer an unusual or allergic reaction to semaglutide, other medicines, foods, dyes, or preservatives pregnant or trying to get pregnant breast-feeding How should I use this medication? This medicine is for injection under the skin of your upper leg (thigh), stomach area, or upper arm. It is given once every week (every 7 days). You will be taught how to prepare and give this medicine. Use exactly as directed. Take your medicine at regular intervals. Do not take it more often thandirected. If you use this medicine with insulin, you should inject this medicine and the insulin separately. Do not mix them together. Do not give the injections rightnext to each other. Change (rotate) injection sites with each injection. It is important that you put your used needles and syringes in a special sharps container. Do not put them in a trash can. If you do not have a sharpscontainer, call your pharmacist or healthcare provider to get one. A special MedGuide will be given to you by the pharmacist with eachprescription and refill. Be sure to read this information carefully each time. This drug comes with  INSTRUCTIONS FOR USE. Ask your pharmacist for directions on how to use this drug. Read the information carefully. Talk to yourpharmacist or health care provider if you have questions. Talk to your pediatrician regarding the use of this medicine in children.Special care may be needed. Overdosage: If you think you have taken too much of this medicine contact apoison control center or emergency room at once. NOTE: This medicine is only for you. Do not share this medicine with others. What if I miss a dose? If you miss a dose, take it as soon as you can within 5 days after the missed dose. Then take your next dose at your regular weekly time. If it has been longer than 5 days after the missed dose, do not take the missed dose. Take the next dose at your regular time. Do not take double or extra doses. If you havequestions about a missed dose, contact your health care provider for advice. What may interact with this medication? other medicines for diabetes Many medications may cause changes in blood sugar, these include: alcohol containing beverages antiviral medicines for HIV or AIDS aspirin and aspirin-like drugs certain medicines for blood pressure, heart disease, irregular heart beat chromium diuretics female hormones, such as estrogens or progestins, birth control pills fenofibrate gemfibrozil isoniazid lanreotide female hormones or anabolic steroids MAOIs like Carbex, Eldepryl, Marplan, Nardil, and Parnate medicines for weight loss medicines for allergies, asthma, cold, or cough medicines for depression, anxiety, or psychotic disturbances niacin nicotine NSAIDs, medicines for pain and inflammation, like ibuprofen or naproxen octreotide pasireotide pentamidine phenytoin probenecid quinolone antibiotics such as ciprofloxacin, levofloxacin, ofloxacin   some herbal dietary supplements steroid medicines such as prednisone or cortisone sulfamethoxazole; trimethoprim thyroid hormones Some  medications can hide the warning symptoms of low blood sugar (hypoglycemia). You may need to monitor your blood sugar more closely if youare taking one of these medications. These include: beta-blockers, often used for high blood pressure or heart problems (examples include atenolol, metoprolol, propranolol) clonidine guanethidine reserpine This list may not describe all possible interactions. Give your health care provider a list of all the medicines, herbs, non-prescription drugs, or dietary supplements you use. Also tell them if you smoke, drink alcohol, or use illegaldrugs. Some items may interact with your medicine. What should I watch for while using this medication? Visit your doctor or health care professional for regular checks on yourprogress. Drink plenty of fluids while taking this medicine. Check with your doctor or health care professional if you get an attack of severe diarrhea, nausea, and vomiting. The loss of too much body fluid can make it dangerous for you to takethis medicine. A test called the HbA1C (A1C) will be monitored. This is a simple blood test. It measures your blood sugar control over the last 2 to 3 months. You willreceive this test every 3 to 6 months. Learn how to check your blood sugar. Learn the symptoms of low and high bloodsugar and how to manage them. Always carry a quick-source of sugar with you in case you have symptoms of low blood sugar. Examples include hard sugar candy or glucose tablets. Make sure others know that you can choke if you eat or drink when you develop serious symptoms of low blood sugar, such as seizures or unconsciousness. They must getmedical help at once. Tell your doctor or health care professional if you have high blood sugar. You might need to change the dose of your medicine. If you are sick or exercisingmore than usual, you might need to change the dose of your medicine. Do not skip meals. Ask your doctor or health care professional if  you should avoid alcohol. Many nonprescription cough and cold products contain sugar oralcohol. These can affect blood sugar. Pens should never be shared. Even if the needle is changed, sharing may resultin passing of viruses like hepatitis or HIV. Wear a medical ID bracelet or chain, and carry a card that describes yourdisease and details of your medicine and dosage times. Do not become pregnant while taking this medicine. Women should inform their doctor if they wish to become pregnant or think they might be pregnant. There is a potential for serious side effects to an unborn child. Talk to your healthcare professional or pharmacist for more information. What side effects may I notice from receiving this medication? Side effects that you should report to your doctor or health care professionalas soon as possible: allergic reactions like skin rash, itching or hives, swelling of the face, lips, or tongue breathing problems changes in vision diarrhea that continues or is severe lump or swelling on the neck severe nausea signs and symptoms of infection like fever or chills; cough; sore throat; pain or trouble passing urine signs and symptoms of low blood sugar such as feeling anxious, confusion, dizziness, increased hunger, unusually weak or tired, sweating, shakiness, cold, irritable, headache, blurred vision, fast heartbeat, loss of consciousness signs and symptoms of kidney injury like trouble passing urine or change in the amount of urine trouble swallowing unusual stomach upset or pain vomiting Side effects that usually do not require medical attention (report to yourdoctor or health care professional   if they continue or are bothersome): constipation diarrhea nausea pain, redness, or irritation at site where injected stomach upset This list may not describe all possible side effects. Call your doctor for medical advice about side effects. You may report side effects to FDA  at1-800-FDA-1088. Where should I keep my medication? Keep out of the reach of children. Store unopened pens in a refrigerator between 2 and 8 degrees C (36 and 46 degrees F). Do not freeze. Protect from light and heat. After you first use the pen, it can be stored for 56 days at room temperature between 15 and 30 degrees C (59 and 86 degrees F) or in a refrigerator. Throw away your used pen after 56days or after the expiration date, whichever comes first. Do not store your pen with the needle attached. If the needle is left on,medicine may leak from the pen. NOTE: This sheet is a summary. It may not cover all possible information. If you have questions about this medicine, talk to your doctor, pharmacist, orhealth care provider.  2022 [employer] (2018-10-11 09:41:51)  

## 2020-08-07 NOTE — Progress Notes (Signed)
Subjective:  Patient ID: Lynn Anderson, female    DOB: 1980-08-03  Age: 40 y.o. MRN: [id number]  CC: Diabetes   HPI ANALYSSA Anderson   is a 40 yo female with a medical history of Type 2 DM  (A1c 11.7). Hyperlipidemia, Hypertension, and obesity who comes in today for follow up for her chronic medical conditions.   Interval History: Blood sugars are 160-170 and is currently on 55 units of Lantus.  Placed on Farxiga at her last office visit which she could not tolerate due to yeast infections. She is doing well on Ozempic and has lost 6 pounds in the last 2 months. She denies additional concerns today. Past Medical History:  Diagnosis Date   Diabetes mellitus without complication ([place])    Hyperlipidemia    Hypertension     Past Surgical History:  Procedure Laterality Date   CHOLECYSTECTOMY     Mirena      Inserted 10-16-14    Family History  Problem Relation Age of Onset   Hyperlipidemia Mother    Hypertension Mother    Diabetes Mother    Diabetes Maternal Grandmother    Heart disease Father    Diabetes Maternal Aunt     Allergies  Allergen Reactions   Metformin And Related Diarrhea    Outpatient Medications Prior to Visit  Medication Sig Dispense Refill   Blood Glucose Monitoring Suppl (ONE TOUCH ULTRA 2) w/Device KIT 1 each by Does not apply route 3 (three) times daily. 1 each 0   glipiZIDE (GLUCOTROL) 10 MG tablet Take 2 tablets (20 mg total) by mouth 2 (two) times daily before a meal. 360 tablet 1   glucose blood (ONETOUCH VERIO) test strip Use to test blood sugar 2 times daily as instructed 100 each 2   ibuprofen (ADVIL) 800 MG tablet Take 1 tablet (800 mg total) by mouth 3 (three) times daily. 21 tablet 0   insulin glargine (LANTUS SOLOSTAR) 100 UNIT/ML Solostar Pen Inject 45 Units into the skin daily. 30 mL 6   Insulin Pen Needle (TRUEPLUS PEN NEEDLES) 32G X 4 MM MISC Use as directed once daily to administer victoza and Lantus 100 each 3   Lancets (ONETOUCH  ULTRASOFT) lancets Use as instructed 100 each 12   levonorgestrel (MIRENA) 20 MCG/24HR IUD 1 each by Intrauterine route once.     losartan (COZAAR) 100 MG tablet Take 1 tablet (100 mg total) by mouth daily. 90 tablet 1   ONETOUCH DELICA LANCETS FINE MISC Use to test blood sugar 2 times daily as instructed. 100 each 2   simvastatin (ZOCOR) 40 MG tablet TAKE 1 TABLET BY MOUTH EVERY DAY AT 6 PM 90 tablet 1   dapagliflozin propanediol (FARXIGA) 10 MG TABS tablet Take 1 tablet (10 mg total) by mouth daily before breakfast. 30 tablet 6   Semaglutide,0.25 or 0.5MG/DOS, (OZEMPIC, 0.25 OR 0.5 MG/DOSE,) 2 MG/1.5ML SOPN Inject 0.5 mg into the skin once a week. 1.5 mL 2   fluconazole (DIFLUCAN) 150 MG tablet Take 1 tablet (150 mg total) by mouth daily. (Patient not taking: Reported on 08/07/2020) 15 tablet 0   No facility-administered medications prior to visit.     ROS Review of Systems  Constitutional:  Negative for activity change, appetite change and fatigue.  HENT:  Negative for congestion, sinus pressure and sore throat.   Eyes:  Negative for visual disturbance.  Respiratory:  Negative for cough, chest tightness, shortness of breath and wheezing.   Cardiovascular:  Negative for  chest pain and palpitations.  Gastrointestinal:  Negative for abdominal distention, abdominal pain and constipation.  Endocrine: Negative for polydipsia.  Genitourinary:  Negative for dysuria and frequency.  Musculoskeletal:  Negative for arthralgias and back pain.  Skin:  Negative for rash.  Neurological:  Negative for tremors, light-headedness and numbness.  Hematological:  Does not bruise/bleed easily.  Psychiatric/Behavioral:  Negative for agitation and behavioral problems.    Objective:  BP 136/83   Pulse (!) 104   Ht _0  (1.702 m)   Wt 281 lb (127.5 kg)   SpO2 98%   BMI 44.01 kg/m   BP/Weight 08/07/2020 1/60/1093 03/15/5571  Systolic BP 220 254 -  Diastolic BP 83 89 -  Wt. (Lbs) 281 287 284.1  BMI 44.01  44.95 44.5      Physical Exam Constitutional:      Appearance: She is well-developed. She is obese.  Neck:     Vascular: No JVD.  Cardiovascular:     Rate and Rhythm: Normal rate.     Heart sounds: Normal heart sounds. No murmur heard. Pulmonary:     Effort: Pulmonary effort is normal.     Breath sounds: Normal breath sounds. No wheezing or rales.  Chest:     Chest wall: No tenderness.  Abdominal:     General: Bowel sounds are normal. There is no distension.     Palpations: Abdomen is soft. There is no mass.     Tenderness: There is no abdominal tenderness.  Musculoskeletal:        General: Normal range of motion.     Right lower leg: No edema.     Left lower leg: No edema.  Neurological:     Mental Status: She is alert and oriented to person, place, and time.  Psychiatric:        Mood and Affect: Mood normal.    CMP Latest Ref Rng & Units 06/05/2020 12/06/2019 06/06/2019  Glucose 65 - 99 mg/dL 261(H) 245(H) 209(H)  BUN 6 - 24 mg/dL _1 Creatinine 0.57 - 1.00 mg/dL 0.76 0.73 0.90  Sodium 134 - 144 mmol/L 143 139 137  Potassium 3.5 - 5.2 mmol/L 4.9 4.7 4.3  Chloride 96 - 106 mmol/L 105 102 99  CO2 20 - 29 mmol/L _2 Calcium 8.7 - 10.2 mg/dL 9.9 9.4 10.2  Total Protein 6.0 - 8.5 g/dL 7.2 - 7.6  Total Bilirubin 0.0 - 1.2 mg/dL 0.4 - 0.4  Alkaline Phos 44 - 121 IU/L 68 - 67  AST 0 - 40 IU/L 19 - 25  ALT 0 - 32 IU/L 29 - 32    Lipid Panel     Component Value Date/Time   CHOL 207 (H) 06/05/2020 1058   TRIG 164 (H) 06/05/2020 1058   HDL 47 06/05/2020 1058   CHOLHDL 4.4 06/05/2020 1058   CHOLHDL 5.4 (H) 10/07/2016 1131   VLDL 55 (H) 10/07/2016 1131   LDLCALC 131 (H) 06/05/2020 1058    CBC    Component Value Date/Time   WBC 8.4 07/21/2017 1230   RBC 5.45 (H) 07/21/2017 1230   HGB 13.8 07/21/2017 1230   HCT 42.7 07/21/2017 1230   PLT 238 07/21/2017 1230   MCV 78.3 07/21/2017 1230   MCH 25.3 (L) 07/21/2017 1230   MCHC 32.3 07/21/2017 1230   RDW 12.6  07/21/2017 1230   LYMPHSABS 2,592 10/07/2016 1131   MONOABS 672 10/07/2016 1131   EOSABS 96 10/07/2016 1131   BASOSABS 0 10/07/2016  1131    Lab Results  Component Value Date   HGBA1C 11.7 (A) 06/05/2020    Assessment & Plan:  1. Type 2 diabetes mellitus with hyperglycemia, without long-term current use of insulin (HCC) Uncontrolled with A1c of 11.7; goal is less than 7.0 Blood sugars reveal improvement but random blood sugar in the clinic is 258 which is still above goal Dose of Ozempic increased, continue Lantus at current dose of 55 units. Commended on weight loss Counseled on Diabetic diet, my plate method, 017 minutes of moderate intensity exercise/week Blood sugar logs with fasting goals of 80-120 mg/dl, random of less than 180 and in the event of sugars less than 60 mg/dl or greater than 400 mg/dl encouraged to notify the clinic. Advised on the need for annual eye exams, annual foot exams, Pneumonia vaccine. - POCT glucose (manual entry) - Semaglutide, 1 MG/DOSE, 4 MG/3ML SOPN; Inject 1 mg into the skin once a week.  Dispense: 3 mL; Refill: 6    Meds ordered this encounter  Medications   Semaglutide, 1 MG/DOSE, 4 MG/3ML SOPN    Sig: Inject 1 mg into the skin once a week.    Dispense:  3 mL    Refill:  6    Discontinue previous dose    Follow-up: Return in about 3 months (around 11/07/2020) for Medical conditions.       Charlott Rakes, MD, FAAFP. [facility] and [place], [place]   08/07/2020, 4:56 PM

## 2020-11-05 ENCOUNTER — Other Ambulatory Visit: Payer: Self-pay

## 2020-11-05 ENCOUNTER — Ambulatory Visit: Payer: No Typology Code available for payment source | Attending: Family Medicine | Admitting: Family Medicine

## 2020-11-05 VITALS — BP 133/81 | HR 102 | Ht 67.0 in | Wt 283.0 lb

## 2020-11-05 DIAGNOSIS — E1169 Type 2 diabetes mellitus with other specified complication: Secondary | ICD-10-CM

## 2020-11-05 DIAGNOSIS — I152 Hypertension secondary to endocrine disorders: Secondary | ICD-10-CM

## 2020-11-05 DIAGNOSIS — E1165 Type 2 diabetes mellitus with hyperglycemia: Secondary | ICD-10-CM | POA: Diagnosis not present

## 2020-11-05 DIAGNOSIS — B373 Candidiasis of vulva and vagina: Secondary | ICD-10-CM

## 2020-11-05 DIAGNOSIS — E1159 Type 2 diabetes mellitus with other circulatory complications: Secondary | ICD-10-CM | POA: Diagnosis not present

## 2020-11-05 DIAGNOSIS — B3731 Acute candidiasis of vulva and vagina: Secondary | ICD-10-CM

## 2020-11-05 DIAGNOSIS — E785 Hyperlipidemia, unspecified: Secondary | ICD-10-CM

## 2020-11-05 LAB — GLUCOSE, POCT (MANUAL RESULT ENTRY): POC Glucose: 156 mg/dl — AB (ref 70–99)

## 2020-11-05 LAB — POCT GLYCOSYLATED HEMOGLOBIN (HGB A1C): HbA1c, POC (controlled diabetic range): 9.7 % — AB (ref 0.0–7.0)

## 2020-11-05 MED ORDER — FLUCONAZOLE 150 MG PO TABS: 150.0000 mg | ORAL_TABLET | Freq: Every day | ORAL | 0 refills | Status: DC | PRN

## 2020-11-05 MED ORDER — SEMAGLUTIDE (2 MG/DOSE) 8 MG/3ML ~~LOC~~ SOPN: 2.0000 mg | PEN_INJECTOR | SUBCUTANEOUS | 6 refills | Status: DC

## 2020-11-05 MED ORDER — LOSARTAN POTASSIUM 100 MG PO TABS: 100.0000 mg | ORAL_TABLET | Freq: Every day | ORAL | 1 refills | Status: DC

## 2020-11-05 MED ORDER — ATORVASTATIN CALCIUM 40 MG PO TABS: 40.0000 mg | ORAL_TABLET | Freq: Every day | ORAL | 3 refills | Status: DC

## 2020-11-05 MED ORDER — GLIPIZIDE 10 MG PO TABS: 20.0000 mg | ORAL_TABLET | Freq: Two times a day (BID) | ORAL | 1 refills | Status: DC

## 2020-11-05 NOTE — Patient Instructions (Signed)

## 2020-11-05 NOTE — Progress Notes (Signed)
Refill on diflucan

## 2020-11-05 NOTE — Progress Notes (Signed)
Subjective:  Patient ID: Lynn Anderson, female    DOB: 1980-09-14  Age: 40 y.o. MRN: [id number]  CC: Diabetes   HPI Lynn Anderson is a 40 y.o. year old female with a history of Type 2 DM  (A1c 9.7). Hyperlipidemia, Hypertension, and obesity who comes in today for follow up for her chronic medical conditions.   Interval History: Her sugars have been in the 150s ever since Ozempic was increased at her last visit.  A1c is 9.7 down from 42 previously. She has never had sugars <80 even though on some occasions she has felt jittery.  Denies presence of blurry vision and is up-to-date on annual eye exams. Sometimes has tingling in feet after prolonged standing as she works in the [employer] but she denies peripheral neuropathic symptoms. Compliant with her statin and last lipid panel revealed LDL above goal. Doing well on her antihypertensive.  She is due for a PAP smear but will be scheduling this with her GYN.  Requests a refill of Diflucan which she uses as needed for vaginal candidiasis. Past Medical History:  Diagnosis Date   Diabetes mellitus without complication ([place])    Hyperlipidemia    Hypertension     Past Surgical History:  Procedure Laterality Date   CHOLECYSTECTOMY     Mirena      Inserted 10-16-14    Family History  Problem Relation Age of Onset   Hyperlipidemia Mother    Hypertension Mother    Diabetes Mother    Diabetes Maternal Grandmother    Heart disease Father    Diabetes Maternal Aunt     Allergies  Allergen Reactions   Metformin And Related Diarrhea    Outpatient Medications Prior to Visit  Medication Sig Dispense Refill   Blood Glucose Monitoring Suppl (ONE TOUCH ULTRA 2) w/Device KIT 1 each by Does not apply route 3 (three) times daily. 1 each 0   glucose blood (ONETOUCH VERIO) test strip Use to test blood sugar 2 times daily as instructed 100 each 2   ibuprofen (ADVIL) 800 MG tablet Take 1 tablet (800 mg total) by mouth 3 (three) times daily. 21  tablet 0   insulin glargine (LANTUS SOLOSTAR) 100 UNIT/ML Solostar Pen Inject 55 Units into the skin daily. 30 mL 6   Insulin Pen Needle (TRUEPLUS PEN NEEDLES) 32G X 4 MM MISC Use as directed once daily to administer victoza and Lantus 100 each 3   Lancets (ONETOUCH ULTRASOFT) lancets Use as instructed 100 each 12   levonorgestrel (MIRENA) 20 MCG/24HR IUD 1 each by Intrauterine route once.     ONETOUCH DELICA LANCETS FINE MISC Use to test blood sugar 2 times daily as instructed. 100 each 2   fluconazole (DIFLUCAN) 150 MG tablet Take 1 tablet (150 mg total) by mouth daily. 15 tablet 0   glipiZIDE (GLUCOTROL) 10 MG tablet Take 2 tablets (20 mg total) by mouth 2 (two) times daily before a meal. 360 tablet 1   losartan (COZAAR) 100 MG tablet Take 1 tablet (100 mg total) by mouth daily. 90 tablet 1   Semaglutide, 1 MG/DOSE, 4 MG/3ML SOPN Inject 1 mg into the skin once a week. 3 mL 6   simvastatin (ZOCOR) 40 MG tablet TAKE 1 TABLET BY MOUTH EVERY DAY AT 6 PM 90 tablet 1   No facility-administered medications prior to visit.     ROS Review of Systems  Constitutional:  Negative for activity change, appetite change and fatigue.  HENT:  Negative for  congestion, sinus pressure and sore throat.   Eyes:  Negative for visual disturbance.  Respiratory:  Negative for cough, chest tightness, shortness of breath and wheezing.   Cardiovascular:  Negative for chest pain and palpitations.  Gastrointestinal:  Negative for abdominal distention, abdominal pain and constipation.  Endocrine: Negative for polydipsia.  Genitourinary:  Negative for dysuria and frequency.  Musculoskeletal:  Negative for arthralgias and back pain.  Skin:  Negative for rash.  Neurological:  Negative for tremors, light-headedness and numbness.  Hematological:  Does not bruise/bleed easily.  Psychiatric/Behavioral:  Negative for agitation and behavioral problems.    Objective:  BP 133/81   Pulse (!) 102   Ht '5\' 7"'  (1.702 m)   Wt  283 lb (128.4 kg)   SpO2 98%   BMI 44.32 kg/m   BP/Weight 11/05/2020 08/07/2020 5/00/9381  Systolic BP 829 937 169  Diastolic BP 81 83 89  Wt. (Lbs) 283 281 287  BMI 44.32 44.01 44.95      Physical Exam Constitutional:      Appearance: She is well-developed.  Cardiovascular:     Rate and Rhythm: Tachycardia present.     Heart sounds: Normal heart sounds. No murmur heard. Pulmonary:     Effort: Pulmonary effort is normal.     Breath sounds: Normal breath sounds. No wheezing or rales.  Chest:     Chest wall: No tenderness.  Abdominal:     General: Bowel sounds are normal. There is no distension.     Palpations: Abdomen is soft. There is no mass.     Tenderness: There is no abdominal tenderness.  Musculoskeletal:        General: Normal range of motion.     Right lower leg: No edema.     Left lower leg: No edema.  Neurological:     Mental Status: She is alert and oriented to person, place, and time.  Psychiatric:        Mood and Affect: Mood normal.    CMP Latest Ref Rng & Units 06/05/2020 12/06/2019 06/06/2019  Glucose 65 - 99 mg/dL 261(H) 245(H) 209(H)  BUN 6 - 24 mg/dL '8 8 9  ' Creatinine 0.57 - 1.00 mg/dL 0.76 0.73 0.90  Sodium 134 - 144 mmol/L 143 139 137  Potassium 3.5 - 5.2 mmol/L 4.9 4.7 4.3  Chloride 96 - 106 mmol/L 105 102 99  CO2 20 - 29 mmol/L '20 23 22  ' Calcium 8.7 - 10.2 mg/dL 9.9 9.4 10.2  Total Protein 6.0 - 8.5 g/dL 7.2 - 7.6  Total Bilirubin 0.0 - 1.2 mg/dL 0.4 - 0.4  Alkaline Phos 44 - 121 IU/L 68 - 67  AST 0 - 40 IU/L 19 - 25  ALT 0 - 32 IU/L 29 - 32    Lipid Panel     Component Value Date/Time   CHOL 207 (H) 06/05/2020 1058   TRIG 164 (H) 06/05/2020 1058   HDL 47 06/05/2020 1058   CHOLHDL 4.4 06/05/2020 1058   CHOLHDL 5.4 (H) 10/07/2016 1131   VLDL 55 (H) 10/07/2016 1131   LDLCALC 131 (H) 06/05/2020 1058    CBC    Component Value Date/Time   WBC 8.4 07/21/2017 1230   RBC 5.45 (H) 07/21/2017 1230   HGB 13.8 07/21/2017 1230   HCT 42.7  07/21/2017 1230   PLT 238 07/21/2017 1230   MCV 78.3 07/21/2017 1230   MCH 25.3 (L) 07/21/2017 1230   MCHC 32.3 07/21/2017 1230   RDW 12.6 07/21/2017 1230  LYMPHSABS 2,592 10/07/2016 1131   MONOABS 672 10/07/2016 1131   EOSABS 96 10/07/2016 1131   BASOSABS 0 10/07/2016 1131    Lab Results  Component Value Date   HGBA1C 9.7 (A) 11/05/2020    Assessment & Plan:  1. Type 2 diabetes mellitus with hyperglycemia, without long-term current use of insulin (HCC) Uncontrolled with A1c of 9.7 which is an improvement from previous A1c of 11.7 Increased Ozempic dose Counseled on Diabetic diet, my plate method, 982 minutes of moderate intensity exercise/week Blood sugar logs with fasting goals of 80-120 mg/dl, random of less than 180 and in the event of sugars less than 60 mg/dl or greater than 400 mg/dl encouraged to notify the clinic. Advised on the need for annual eye exams, annual foot exams, Pneumonia vaccine. - POCT glucose (manual entry) - POCT glycosylated hemoglobin (Hb A1C) - Semaglutide, 2 MG/DOSE, 8 MG/3ML SOPN; Inject 2 mg as directed once a week.  Dispense: 3 mL; Refill: 6 - glipiZIDE (GLUCOTROL) 10 MG tablet; Take 2 tablets (20 mg total) by mouth 2 (two) times daily before a meal.  Dispense: 360 tablet; Refill: 1  2. Hypertension associated with diabetes ([place]) Controlled Counseled on blood pressure goal of less than 130/80, low-sodium, DASH diet, medication compliance, 150 minutes of moderate intensity exercise per week. Discussed medication compliance, adverse effects. - losartan (COZAAR) 100 MG tablet; Take 1 tablet (100 mg total) by mouth daily.  Dispense: 90 tablet; Refill: 1  3. Hyperlipidemia associated with type 2 diabetes mellitus (HCC) LDL of 131 which is above goal of less than 70 Switched from simvastatin to atorvastatin - atorvastatin (LIPITOR) 40 MG tablet; Take 1 tablet (40 mg total) by mouth daily.  Dispense: 90 tablet; Refill: 3  4. Vaginal candidiasis She  does have recurrent vaginal candidiasis and I have refilled Diflucan to be used. - fluconazole (DIFLUCAN) 150 MG tablet; Take 1 tablet (150 mg total) by mouth daily as needed.  Dispense: 15 tablet; Refill: 0  Meds ordered this encounter  Medications   atorvastatin (LIPITOR) 40 MG tablet    Sig: Take 1 tablet (40 mg total) by mouth daily.    Dispense:  90 tablet    Refill:  3   Semaglutide, 2 MG/DOSE, 8 MG/3ML SOPN    Sig: Inject 2 mg as directed once a week.    Dispense:  3 mL    Refill:  6    Dose increase   fluconazole (DIFLUCAN) 150 MG tablet    Sig: Take 1 tablet (150 mg total) by mouth daily as needed.    Dispense:  15 tablet    Refill:  0   glipiZIDE (GLUCOTROL) 10 MG tablet    Sig: Take 2 tablets (20 mg total) by mouth 2 (two) times daily before a meal.    Dispense:  360 tablet    Refill:  1   losartan (COZAAR) 100 MG tablet    Sig: Take 1 tablet (100 mg total) by mouth daily.    Dispense:  90 tablet    Refill:  1    Follow-up: Return in about 3 months (around 02/04/2021) for Medical conditions.       Charlott Rakes, MD, FAAFP. [facility] and [place], [place]   11/05/2020, 5:06 PM

## 2020-11-20 ENCOUNTER — Telehealth: Payer: Self-pay | Admitting: *Deleted

## 2020-11-20 NOTE — Telephone Encounter (Signed)
Copied from CRM [id number]. Topic: General - Other >> Nov 13, 2020 10:47 AM Marylen Ponto wrote: Reason for CRM: Pt stated her insurance will not cover refilling the Rx for insulin glargine (LANTUS SOLOSTAR) 100 UNIT/ML Solostar Pen because the directions does not indicate the dose increase to 55 units. Pt asked that a new Rx with the increased dose of 55 units be sent to her pharmacy.

## 2020-12-06 ENCOUNTER — Telehealth: Payer: Self-pay | Admitting: Family Medicine

## 2020-12-06 ENCOUNTER — Other Ambulatory Visit: Payer: Self-pay | Admitting: Family Medicine

## 2020-12-06 DIAGNOSIS — E1165 Type 2 diabetes mellitus with hyperglycemia: Secondary | ICD-10-CM

## 2020-12-06 NOTE — Telephone Encounter (Signed)
Pharmacy calling to advise the  Semaglutide, 2 MG/DOSE, 8 MG/3ML SOPN  Is on backorder and they are not sure when they will even get it in.  They suggest Saxenda and spoke with pt about it.  Pt is ok with this. The 1mg  is on hold because it will probably be backordered as well with no expected date. Pt would like to know what the dr is going to do.  [facility] - [facility], [facility]

## 2020-12-09 MED ORDER — TRULICITY 1.5 MG/0.5ML ~~LOC~~ SOAJ: 1.5000 mg | SUBCUTANEOUS | 3 refills | Status: DC

## 2020-12-09 NOTE — Telephone Encounter (Signed)
Called pt made aware of MD message and instructions. Verbalized understanding 

## 2020-12-09 NOTE — Telephone Encounter (Signed)
Called pt left VM to call back.  

## 2020-12-09 NOTE — Telephone Encounter (Signed)
I have switched her to Trulicity once a week which will better control her diabetes and we have greater room to increase the dose if needed down the road.

## 2020-12-09 NOTE — Telephone Encounter (Signed)
The patient has returned the missed call from the practice  Please contact again when possible

## 2020-12-09 NOTE — Telephone Encounter (Signed)
She will remain on Trulicity as it is an equally effective medication in the same class of medications as Ozempic.

## 2020-12-10 NOTE — Telephone Encounter (Signed)
Called pt made aware. No other questions verbalized

## 2020-12-25 ENCOUNTER — Encounter: Payer: Self-pay | Admitting: Family Medicine

## 2020-12-25 ENCOUNTER — Other Ambulatory Visit: Payer: Self-pay | Admitting: Family Medicine

## 2020-12-25 MED ORDER — CLOTRIMAZOLE 3 2 % VA CREA: 1.0000 | TOPICAL_CREAM | Freq: Every day | VAGINAL | 0 refills | Status: DC

## 2020-12-30 ENCOUNTER — Other Ambulatory Visit: Payer: Self-pay | Admitting: Family Medicine

## 2020-12-30 MED ORDER — TERCONAZOLE 0.8 % VA CREA: 1.0000 | TOPICAL_CREAM | Freq: Every day | VAGINAL | 1 refills | Status: DC

## 2021-02-12 ENCOUNTER — Ambulatory Visit: Payer: PRIVATE HEALTH INSURANCE | Attending: Family Medicine | Admitting: Family Medicine

## 2021-02-12 ENCOUNTER — Other Ambulatory Visit: Payer: Self-pay

## 2021-02-12 ENCOUNTER — Encounter: Payer: Self-pay | Admitting: Family Medicine

## 2021-02-12 VITALS — BP 139/90 | HR 93 | Ht 67.0 in | Wt 286.6 lb

## 2021-02-12 DIAGNOSIS — E785 Hyperlipidemia, unspecified: Secondary | ICD-10-CM | POA: Diagnosis not present

## 2021-02-12 DIAGNOSIS — E1169 Type 2 diabetes mellitus with other specified complication: Secondary | ICD-10-CM | POA: Diagnosis not present

## 2021-02-12 DIAGNOSIS — E1159 Type 2 diabetes mellitus with other circulatory complications: Secondary | ICD-10-CM

## 2021-02-12 DIAGNOSIS — I152 Hypertension secondary to endocrine disorders: Secondary | ICD-10-CM

## 2021-02-12 DIAGNOSIS — E1165 Type 2 diabetes mellitus with hyperglycemia: Secondary | ICD-10-CM

## 2021-02-12 LAB — POCT GLYCOSYLATED HEMOGLOBIN (HGB A1C): HbA1c, POC (controlled diabetic range): 10.5 % — AB (ref 0.0–7.0)

## 2021-02-12 LAB — GLUCOSE, POCT (MANUAL RESULT ENTRY): POC Glucose: 167 mg/dl — AB (ref 70–99)

## 2021-02-12 MED ORDER — GLIPIZIDE 10 MG PO TABS: 20.0000 mg | ORAL_TABLET | Freq: Two times a day (BID) | ORAL | 1 refills | Status: DC

## 2021-02-12 MED ORDER — TRULICITY 1.5 MG/0.5ML ~~LOC~~ SOAJ: 1.5000 mg | SUBCUTANEOUS | 3 refills | Status: DC

## 2021-02-12 MED ORDER — LANTUS SOLOSTAR 100 UNIT/ML ~~LOC~~ SOPN: 55.0000 [IU] | PEN_INJECTOR | Freq: Every day | SUBCUTANEOUS | 6 refills | Status: DC

## 2021-02-12 MED ORDER — ATORVASTATIN CALCIUM 40 MG PO TABS: 40.0000 mg | ORAL_TABLET | Freq: Every day | ORAL | 3 refills | Status: DC

## 2021-02-12 MED ORDER — LOSARTAN POTASSIUM 100 MG PO TABS: 100.0000 mg | ORAL_TABLET | Freq: Every day | ORAL | 1 refills | Status: DC

## 2021-02-12 NOTE — Progress Notes (Signed)
Subjective:  Patient ID: Lynn Anderson, female    DOB: 1980/07/27  Age: 41 y.o. MRN: [id number]  CC: Diabetes   HPI Lynn Anderson is a 41 y.o. year old female with a history of Type 2 DM  (A1c 10.5). Hyperlipidemia, Hypertension, and obesity who comes in today for follow up for her chronic medical conditions.   Interval History: At home fasting sugars are 90-120 but she does have slightly elevated postprandial sugars.  Her A1c is 10.5 up from 9.7 previously and she endorses compliance with Trulicity 1.5 mg.  She was previously on Ozempic which was on backorder and she had to be switched to Trulicity. Gets little exercise but not as much as she needs to. Due for an eye exam and she plans on scheduling an appointment for this Compliant with her antihypertensive and statin She has no additional concerns today. Past Medical History:  Diagnosis Date   Diabetes mellitus without complication ([place])    Hyperlipidemia    Hypertension     Past Surgical History:  Procedure Laterality Date   CHOLECYSTECTOMY     Mirena      Inserted 10-16-14    Family History  Problem Relation Age of Onset   Hyperlipidemia Mother    Hypertension Mother    Diabetes Mother    Diabetes Maternal Grandmother    Heart disease Father    Diabetes Maternal Aunt     Allergies  Allergen Reactions   Metformin And Related Diarrhea    Outpatient Medications Prior to Visit  Medication Sig Dispense Refill   Blood Glucose Monitoring Suppl (ONE TOUCH ULTRA 2) w/Device KIT 1 each by Does not apply route 3 (three) times daily. 1 each 0   fluconazole (DIFLUCAN) 150 MG tablet Take 1 tablet (150 mg total) by mouth daily as needed. 15 tablet 0   glucose blood (ONETOUCH VERIO) test strip Use to test blood sugar 2 times daily as instructed 100 each 2   ibuprofen (ADVIL) 800 MG tablet Take 1 tablet (800 mg total) by mouth 3 (three) times daily. 21 tablet 0   Insulin Pen Needle (BD PEN NEEDLE NANO U/F) 32G X 4 MM  MISC Use as directed once daily to administer victoza and Lantus 100 each 3   Lancets (ONETOUCH ULTRASOFT) lancets Use as instructed 100 each 12   levonorgestrel (MIRENA) 20 MCG/24HR IUD 1 each by Intrauterine route once.     ONETOUCH DELICA LANCETS FINE MISC Use to test blood sugar 2 times daily as instructed. 100 each 2   terconazole (TERAZOL 3) 0.8 % vaginal cream Place 1 applicator vaginally at bedtime. 20 g 1   atorvastatin (LIPITOR) 40 MG tablet Take 1 tablet (40 mg total) by mouth daily. 90 tablet 3   Dulaglutide (TRULICITY) 1.5 ML/4.6TK SOPN Inject 1.5 mg into the skin once a week. 2 mL 3   glipiZIDE (GLUCOTROL) 10 MG tablet Take 2 tablets (20 mg total) by mouth 2 (two) times daily before a meal. 360 tablet 1   insulin glargine (LANTUS SOLOSTAR) 100 UNIT/ML Solostar Pen Inject 55 Units into the skin daily. 30 mL 6   losartan (COZAAR) 100 MG tablet Take 1 tablet (100 mg total) by mouth daily. 90 tablet 1   No facility-administered medications prior to visit.     ROS Review of Systems  Constitutional:  Negative for activity change, appetite change and fatigue.  HENT:  Negative for congestion, sinus pressure and sore throat.   Eyes:  Negative for visual  disturbance.  Respiratory:  Negative for cough, chest tightness, shortness of breath and wheezing.   Cardiovascular:  Negative for chest pain and palpitations.  Gastrointestinal:  Negative for abdominal distention, abdominal pain and constipation.  Endocrine: Negative for polydipsia.  Genitourinary:  Negative for dysuria and frequency.  Musculoskeletal:  Negative for arthralgias and back pain.  Skin:  Negative for rash.  Neurological:  Negative for tremors, light-headedness and numbness.  Hematological:  Does not bruise/bleed easily.  Psychiatric/Behavioral:  Negative for agitation and behavioral problems.    Objective:  BP 139/90    Pulse 93    Ht '5\' 7"'  (1.702 m)    Wt 286 lb 9.6 oz (130 kg)    SpO2 99%    BMI 44.89 kg/m    BP/Weight 02/12/2021 11/05/2020 06/07/7679  Systolic BP 157 262 035  Diastolic BP 90 81 83  Wt. (Lbs) 286.6 283 281  BMI 44.89 44.32 44.01      Physical Exam Constitutional:      Appearance: She is well-developed.  Cardiovascular:     Rate and Rhythm: Normal rate.     Heart sounds: Normal heart sounds. No murmur heard. Pulmonary:     Effort: Pulmonary effort is normal.     Breath sounds: Normal breath sounds. No wheezing or rales.  Chest:     Chest wall: No tenderness.  Abdominal:     General: Bowel sounds are normal. There is no distension.     Palpations: Abdomen is soft. There is no mass.     Tenderness: There is no abdominal tenderness.  Musculoskeletal:        General: Normal range of motion.     Right lower leg: No edema.     Left lower leg: No edema.  Neurological:     Mental Status: She is alert and oriented to person, place, and time.  Psychiatric:        Mood and Affect: Mood normal.    CMP Latest Ref Rng & Units 06/05/2020 12/06/2019 06/06/2019  Glucose 65 - 99 mg/dL 261(H) 245(H) 209(H)  BUN 6 - 24 mg/dL '8 8 9  ' Creatinine 0.57 - 1.00 mg/dL 0.76 0.73 0.90  Sodium 134 - 144 mmol/L 143 139 137  Potassium 3.5 - 5.2 mmol/L 4.9 4.7 4.3  Chloride 96 - 106 mmol/L 105 102 99  CO2 20 - 29 mmol/L '20 23 22  ' Calcium 8.7 - 10.2 mg/dL 9.9 9.4 10.2  Total Protein 6.0 - 8.5 g/dL 7.2 - 7.6  Total Bilirubin 0.0 - 1.2 mg/dL 0.4 - 0.4  Alkaline Phos 44 - 121 IU/L 68 - 67  AST 0 - 40 IU/L 19 - 25  ALT 0 - 32 IU/L 29 - 32    Lipid Panel     Component Value Date/Time   CHOL 207 (H) 06/05/2020 1058   TRIG 164 (H) 06/05/2020 1058   HDL 47 06/05/2020 1058   CHOLHDL 4.4 06/05/2020 1058   CHOLHDL 5.4 (H) 10/07/2016 1131   VLDL 55 (H) 10/07/2016 1131   LDLCALC 131 (H) 06/05/2020 1058    CBC    Component Value Date/Time   WBC 8.4 07/21/2017 1230   RBC 5.45 (H) 07/21/2017 1230   HGB 13.8 07/21/2017 1230   HCT 42.7 07/21/2017 1230   PLT 238 07/21/2017 1230   MCV 78.3  07/21/2017 1230   MCH 25.3 (L) 07/21/2017 1230   MCHC 32.3 07/21/2017 1230   RDW 12.6 07/21/2017 1230   LYMPHSABS 2,592 10/07/2016 1131   MONOABS  672 10/07/2016 1131   EOSABS 96 10/07/2016 1131   BASOSABS 0 10/07/2016 1131    Lab Results  Component Value Date   HGBA1C 10.5 (A) 02/12/2021    Assessment & Plan:  1. Type 2 diabetes mellitus with hyperglycemia, without long-term current use of insulin (HCC) Uncontrolled with A1c of 10.5; goal is less than 7.0 Would love to increase Trulicity to 3.0 mg but this is on backorder so she will remain on 1.5 mg until a 3.0 mg is available. Her fasting sugars are at goal hence I will hold off on increasing Lantus dose to prevent hypoglycemia If at her next visit sugars are still above goal and higher doses of Trulicity and not available consider initiation of short acting NovoLog Counseled on Diabetic diet, my plate method, 940 minutes of moderate intensity exercise/week Blood sugar logs with fasting goals of 80-120 mg/dl, random of less than 180 and in the event of sugars less than 60 mg/dl or greater than 400 mg/dl encouraged to notify the clinic. Advised on the need for annual eye exams, annual foot exams, Pneumonia vaccine. - POCT glucose (manual entry) - POCT glycosylated hemoglobin (Hb A1C) - Microalbumin / creatinine urine ratio - LP+Non-HDL Cholesterol - CMP14+EGFR - glipiZIDE (GLUCOTROL) 10 MG tablet; Take 2 tablets (20 mg total) by mouth 2 (two) times daily before a meal.  Dispense: 360 tablet; Refill: 1 - insulin glargine (LANTUS SOLOSTAR) 100 UNIT/ML Solostar Pen; Inject 55 Units into the skin daily.  Dispense: 30 mL; Refill: 6 - Dulaglutide (TRULICITY) 1.5 HW/8.0SU SOPN; Inject 1.5 mg into the skin once a week.  Dispense: 2 mL; Refill: 3  2. Hyperlipidemia associated with type 2 diabetes mellitus ([place]) Uncontrolled from last labs from 05/2020 Will send of lipid panel and adjust regimen accordingly Low-cholesterol diet -  atorvastatin (LIPITOR) 40 MG tablet; Take 1 tablet (40 mg total) by mouth daily.  Dispense: 90 tablet; Refill: 3  3. Hypertension associated with diabetes ([place]) Controlled Counseled on blood pressure goal of less than 130/80, low-sodium, DASH diet, medication compliance, 150 minutes of moderate intensity exercise per week. Discussed medication compliance, adverse effects. - losartan (COZAAR) 100 MG tablet; Take 1 tablet (100 mg total) by mouth daily.  Dispense: 90 tablet; Refill: 1  Health care maintenance-due for Pap smear and she has a GYN who she has a Pap smear with.  She will be calling to schedule an appointment.  Meds ordered this encounter  Medications   atorvastatin (LIPITOR) 40 MG tablet    Sig: Take 1 tablet (40 mg total) by mouth daily.    Dispense:  90 tablet    Refill:  3   glipiZIDE (GLUCOTROL) 10 MG tablet    Sig: Take 2 tablets (20 mg total) by mouth 2 (two) times daily before a meal.    Dispense:  360 tablet    Refill:  1   insulin glargine (LANTUS SOLOSTAR) 100 UNIT/ML Solostar Pen    Sig: Inject 55 Units into the skin daily.    Dispense:  30 mL    Refill:  6    Dose change   losartan (COZAAR) 100 MG tablet    Sig: Take 1 tablet (100 mg total) by mouth daily.    Dispense:  90 tablet    Refill:  1   Dulaglutide (TRULICITY) 1.5 PJ/0.3PR SOPN    Sig: Inject 1.5 mg into the skin once a week.    Dispense:  2 mL    Refill:  3  Follow-up: Return in about 3 months (around 05/13/2021) for Chronic medical conditions.       Charlott Rakes, MD, FAAFP. [facility] [place], [place]   02/12/2021, 10:36 AM

## 2021-02-13 LAB — CMP14+EGFR
ALT: 18 IU/L (ref 0–32)
AST: 15 IU/L (ref 0–40)
Albumin/Globulin Ratio: 1.5 (ref 1.2–2.2)
Albumin: 4 g/dL (ref 3.8–4.8)
Alkaline Phosphatase: 67 IU/L (ref 44–121)
BUN/Creatinine Ratio: 11 (ref 9–23)
BUN: 9 mg/dL (ref 6–24)
Bilirubin Total: 0.4 mg/dL (ref 0.0–1.2)
CO2: 23 mmol/L (ref 20–29)
Calcium: 9.2 mg/dL (ref 8.7–10.2)
Chloride: 104 mmol/L (ref 96–106)
Creatinine, Ser: 0.8 mg/dL (ref 0.57–1.00)
Globulin, Total: 2.6 g/dL (ref 1.5–4.5)
Glucose: 180 mg/dL — ABNORMAL HIGH (ref 70–99)
Potassium: 4.6 mmol/L (ref 3.5–5.2)
Sodium: 140 mmol/L (ref 134–144)
Total Protein: 6.6 g/dL (ref 6.0–8.5)
eGFR: 94 mL/min/{1.73_m2} (ref >59)

## 2021-02-13 LAB — MICROALBUMIN / CREATININE URINE RATIO
Creatinine, Urine: 152.5 mg/dL
Microalb/Creat Ratio: 17 mg/g creat (ref 0–29)
Microalbumin, Urine: 26.1 ug/mL

## 2021-02-13 LAB — LP+NON-HDL CHOLESTEROL
Cholesterol, Total: 165 mg/dL (ref 100–199)
HDL: 45 mg/dL (ref >39)
LDL Chol Calc (NIH): 92 mg/dL (ref 0–99)
Total Non-HDL-Chol (LDL+VLDL): 120 mg/dL (ref 0–129)
Triglycerides: 164 mg/dL — ABNORMAL HIGH (ref 0–149)
VLDL Cholesterol Cal: 28 mg/dL (ref 5–40)

## 2021-02-14 ENCOUNTER — Encounter: Payer: Self-pay | Admitting: Family Medicine

## 2021-02-14 ENCOUNTER — Other Ambulatory Visit: Payer: Self-pay | Admitting: Family Medicine

## 2021-02-14 MED ORDER — TRULICITY 3 MG/0.5ML ~~LOC~~ SOAJ: 3.0000 mg | SUBCUTANEOUS | 6 refills | Status: DC

## 2021-03-04 ENCOUNTER — Telehealth: Payer: Self-pay | Admitting: Family Medicine

## 2021-03-04 NOTE — Telephone Encounter (Signed)
[facility] calling to follow up on the Prior Authorization for the pt's Trulicity. Pharmacy faxed on 1/20 and he said multiple times. Any questions call [phone]

## 2021-03-05 ENCOUNTER — Telehealth: Payer: Self-pay

## 2021-03-05 ENCOUNTER — Other Ambulatory Visit: Payer: Self-pay

## 2021-03-05 NOTE — Telephone Encounter (Signed)
PA has been approved until 03/05/2022.  Spoke to [place] at [facility] and she was able to process the prescription.  Patient is aware.

## 2021-03-05 NOTE — Telephone Encounter (Signed)
Patient was wanting a status update on her Trulicity PA.  Left message for patient advising that a PA has been submitted and we are waiting on the insurance to respond.  [facility] [phone] will also get an update when approved or denied.

## 2021-03-05 NOTE — Telephone Encounter (Signed)
Lynn Anderson,   This is patient is requesting PA approval for Trulicity. Have you received anything from her pharmacy? I have not gotten anything faxed to me. Are we able to assist?

## 2021-03-05 NOTE — Telephone Encounter (Signed)
Pt calling back to follow up on this PA request for Trulicity.  She said she has been trying to get, and has even sent mychart messages.

## 2021-03-06 NOTE — Telephone Encounter (Signed)
Thank you so much Tresa Endo. Will let Dr. Alvis Lemmings know.   Dr. Alvis Lemmings,   Wanted to route this  to you. Patient was messaging [place] about this I believe. Both her and her pharmacy are aware that the Trulicity is now approved.

## 2021-03-10 ENCOUNTER — Telehealth: Payer: Self-pay

## 2021-03-10 ENCOUNTER — Other Ambulatory Visit: Payer: Self-pay

## 2021-03-10 ENCOUNTER — Other Ambulatory Visit: Payer: Self-pay | Admitting: Family Medicine

## 2021-03-10 MED ORDER — TIRZEPATIDE 2.5 MG/0.5ML ~~LOC~~ SOAJ: 2.5000 mg | SUBCUTANEOUS | 3 refills | Status: DC

## 2021-03-10 NOTE — Telephone Encounter (Signed)
Mounjaro PA approved until 03/10/22.  Pharmacy is aware and processing script=$25

## 2021-05-14 ENCOUNTER — Ambulatory Visit: Payer: PRIVATE HEALTH INSURANCE | Attending: Family Medicine | Admitting: Family Medicine

## 2021-05-14 ENCOUNTER — Encounter: Payer: Self-pay | Admitting: Family Medicine

## 2021-05-14 VITALS — BP 129/87 | HR 101 | Ht 67.0 in | Wt 287.2 lb

## 2021-05-14 DIAGNOSIS — E1165 Type 2 diabetes mellitus with hyperglycemia: Secondary | ICD-10-CM

## 2021-05-14 DIAGNOSIS — E785 Hyperlipidemia, unspecified: Secondary | ICD-10-CM

## 2021-05-14 DIAGNOSIS — E1169 Type 2 diabetes mellitus with other specified complication: Secondary | ICD-10-CM | POA: Diagnosis not present

## 2021-05-14 DIAGNOSIS — B3731 Acute candidiasis of vulva and vagina: Secondary | ICD-10-CM | POA: Diagnosis not present

## 2021-05-14 DIAGNOSIS — I152 Hypertension secondary to endocrine disorders: Secondary | ICD-10-CM | POA: Diagnosis not present

## 2021-05-14 DIAGNOSIS — E1159 Type 2 diabetes mellitus with other circulatory complications: Secondary | ICD-10-CM

## 2021-05-14 LAB — GLUCOSE, POCT (MANUAL RESULT ENTRY): POC Glucose: 206 mg/dl — AB (ref 70–99)

## 2021-05-14 LAB — POCT GLYCOSYLATED HEMOGLOBIN (HGB A1C): HbA1c, POC (controlled diabetic range): 11.1 % — AB (ref 0.0–7.0)

## 2021-05-14 MED ORDER — TIRZEPATIDE 7.5 MG/0.5ML ~~LOC~~ SOAJ
7.5000 mg | SUBCUTANEOUS | 3 refills | Status: DC
Start: 2021-05-14 — End: 2021-08-27

## 2021-05-14 MED ORDER — FLUCONAZOLE 150 MG PO TABS: 150.0000 mg | ORAL_TABLET | Freq: Every day | ORAL | 0 refills | Status: DC | PRN

## 2021-05-14 MED ORDER — TIRZEPATIDE 5 MG/0.5ML ~~LOC~~ SOAJ: 5.0000 mg | SUBCUTANEOUS | 0 refills | Status: DC

## 2021-05-14 NOTE — Progress Notes (Signed)
? ?Subjective:  ?Patient ID: Lynn Anderson, female    DOB: 08/12/1980  Age: 41 y.o. MRN: [id number] ? ?CC: Diabetes ? ? ?HPI ?Lynn Anderson is a 41 y.o. year old female with a history of Type 2 DM  (A1c 11.1). Hyperlipidemia, Hypertension, and obesity who comes in today for follow up for her chronic medical conditions.  ? ?Interval History: ?A1c is 11.1 up from 10.5 previously ?Sugars fluctuate around 120-130 fasting and today in the clinic sugar is 206 fasting with her last meal at 9 PM last night. ?The GLP-1 RA shortage has affected her as well as she was previously on Trulicity and we could not increase from the 1.81m to 3.0 mg due to it being on backorder and then she was subsequently switched to Mounjaro 2.5 mg/week.  She was supposed to increase this to 5 mg/week after 1 month however pharmacy never dispensed the higher dose. ?She is compliant with Lantus 55 units twice daily and glipizide 20 mg twice daily. ?She is yet to schedule annual eye exam.  But states she will be doing this soon. ? ?Compliant with her statin and antihypertensive.  Her major form of exercise is walking. ?She requests a refill of Diflucan which she uses intermittently for recurrent vaginal candidiasis. ? ?Past Medical History:  ?Diagnosis Date  ? [place]   ? Hyperlipidemia   ? Hypertension   ? ? ?Past Surgical History:  ?Procedure Laterality Date  ? CHOLECYSTECTOMY    ? Mirena     ? Inserted 10-16-14  ? ? ?Family History  ?Problem Relation Age of Onset  ? Hyperlipidemia Mother   ? Hypertension Mother   ? Diabetes Mother   ? Diabetes Maternal Grandmother   ? Heart disease Father   ? Diabetes Maternal Aunt   ? ? ?Social History  ? ?Socioeconomic History  ? Marital status: Single  ?  Spouse name: Not on file  ? Number of children: Not on file  ? Years of education: Not on file  ? Highest education level: Not on file  ?Occupational History  ? Not on file  ?Tobacco Use  ? Smoking status: Never  ?  Smokeless tobacco: Never  ?Vaping Use  ? Vaping Use: Never used  ?Substance and Sexual Activity  ? Alcohol use: No  ?  Alcohol/week: 0.0 standard drinks  ? Drug use: No  ? Sexual activity: Not Currently  ?  Birth control/protection: I.U.D.  ?  Comment: Mirena  inserted 10-16-14-INTERCOURSE AGE 27,MORE THAN 5  SEXUAL PARTNERS  ?Other Topics Concern  ? Not on file  ?Social History Narrative  ? Not on file  ? ?Social Determinants of Health  ? ?Financial Resource Strain: Not on file  ?Food Insecurity: Not on file  ?Transportation Needs: Not on file  ?Physical Activity: Not on file  ?Stress: Not on file  ?Social Connections: Not on file  ? ? ?Allergies  ?Allergen Reactions  ? Metformin And Related Diarrhea  ? ? ?Outpatient Medications Prior to Visit  ?Medication Sig Dispense Refill  ? atorvastatin (LIPITOR) 40 MG tablet Take 1 tablet (40 mg total) by mouth daily. 90 tablet 3  ? Blood Glucose Monitoring Suppl (ONE TOUCH ULTRA 2) w/Device KIT 1 each by Does not apply route 3 (three) times daily. 1 each 0  ? glipiZIDE (GLUCOTROL) 10 MG tablet Take 2 tablets (20 mg total) by mouth 2 (two) times daily before a meal. 360 tablet 1  ? glucose blood (ONETOUCH VERIO)  test strip Use to test blood sugar 2 times daily as instructed 100 each 2  ? ibuprofen (ADVIL) 800 MG tablet Take 1 tablet (800 mg total) by mouth 3 (three) times daily. 21 tablet 0  ? insulin glargine (LANTUS SOLOSTAR) 100 UNIT/ML Solostar Pen Inject 55 Units into the skin daily. 30 mL 6  ? Insulin Pen Needle (BD PEN NEEDLE NANO U/F) 32G X 4 MM MISC Use as directed once daily to administer victoza and Lantus 100 each 3  ? Lancets (ONETOUCH ULTRASOFT) lancets Use as instructed 100 each 12  ? levonorgestrel (MIRENA) 20 MCG/24HR IUD 1 each by Intrauterine route once.    ? losartan (COZAAR) 100 MG tablet Take 1 tablet (100 mg total) by mouth daily. 90 tablet 1  ? ONETOUCH DELICA LANCETS FINE MISC Use to test blood sugar 2 times daily as instructed. 100 each 2  ?  terconazole (TERAZOL 3) 0.8 % vaginal cream Place 1 applicator vaginally at bedtime. 20 g 1  ? fluconazole (DIFLUCAN) 150 MG tablet Take 1 tablet (150 mg total) by mouth daily as needed. 15 tablet 0  ? tirzepatide (MOUNJARO) 2.5 MG/0.5ML Pen Inject 2.5 mg into the skin once a week. For 4 weeks then increase to 5 mg/week thereafter. 2 mL 3  ? ?No facility-administered medications prior to visit.  ? ? ? ?ROS ?Review of Systems  ?Constitutional:  Negative for activity change, appetite change and fatigue.  ?HENT:  Negative for congestion, sinus pressure and sore throat.   ?Eyes:  Negative for visual disturbance.  ?Respiratory:  Negative for cough, chest tightness, shortness of breath and wheezing.   ?Cardiovascular:  Negative for chest pain and palpitations.  ?Gastrointestinal:  Negative for abdominal distention, abdominal pain and constipation.  ?Endocrine: Negative for polydipsia.  ?Genitourinary:  Negative for dysuria and frequency.  ?Musculoskeletal:  Negative for arthralgias and back pain.  ?Skin:  Negative for rash.  ?Neurological:  Negative for tremors, light-headedness and numbness.  ?Hematological:  Does not bruise/bleed easily.  ?Psychiatric/Behavioral:  Negative for agitation and behavioral problems.   ? ?Objective:  ?BP 129/87   Pulse (!) 101   Ht _0  (1.702 m)   Wt 287 lb 3.2 oz (130.3 kg)   SpO2 99%   BMI 44.98 kg/m?  ? ? ?  05/14/2021  ?  2:16 PM 02/12/2021  ?  9:09 AM 11/05/2020  ?  4:10 PM  ?BP/Weight  ?Systolic BP 751 025 852  ?Diastolic BP 87 90 81  ?Wt. (Lbs) 287.2 286.6 283  ?BMI 44.98 kg/m2 44.89 kg/m2 44.32 kg/m2  ? ? ? ? ?Physical Exam ?Constitutional:   ?   Appearance: She is well-developed. She is obese.  ?Cardiovascular:  ?   Rate and Rhythm: Tachycardia present.  ?   Heart sounds: Normal heart sounds. No murmur heard. ?Pulmonary:  ?   Effort: Pulmonary effort is normal.  ?   Breath sounds: Normal breath sounds. No wheezing or rales.  ?Chest:  ?   Chest wall: No tenderness.  ?Abdominal:  ?    General: Bowel sounds are normal. There is no distension.  ?   Palpations: Abdomen is soft. There is no mass.  ?   Tenderness: There is no abdominal tenderness.  ?Musculoskeletal:     ?   General: Normal range of motion.  ?   Right lower leg: No edema.  ?   Left lower leg: No edema.  ?Neurological:  ?   Mental Status: She is alert and oriented to  person, place, and time.  ?Psychiatric:     ?   Mood and Affect: Mood normal.  ? ? ? ?  Latest Ref Rng & Units 02/12/2021  ?  9:43 AM 06/05/2020  ? 10:58 AM 12/06/2019  ?  3:03 PM  ?CMP  ?Glucose 70 - 99 mg/dL 180   261   245    ?BUN 6 - 24 mg/dL _0 ?Creatinine 0.57 - 1.00 mg/dL 0.80   0.76   0.73    ?Sodium 134 - 144 mmol/L 140   143   139    ?Potassium 3.5 - 5.2 mmol/L 4.6   4.9   4.7    ?Chloride 96 - 106 mmol/L 104   105   102    ?CO2 20 - 29 mmol/L _1 ?Calcium 8.7 - 10.2 mg/dL 9.2   9.9   9.4    ?Total Protein 6.0 - 8.5 g/dL 6.6   7.2     ?Total Bilirubin 0.0 - 1.2 mg/dL 0.4   0.4     ?Alkaline Phos 44 - 121 IU/L 67   68     ?AST 0 - 40 IU/L 15   19     ?ALT 0 - 32 IU/L 18   29     ? ? ?Lipid Panel  ?   ?Component Value Date/Time  ? CHOL 165 02/12/2021 0943  ? TRIG 164 (H) 02/12/2021 0943  ? HDL 45 02/12/2021 0943  ? CHOLHDL 4.4 06/05/2020 1058  ? CHOLHDL 5.4 (H) 10/07/2016 1131  ? VLDL 55 (H) 10/07/2016 1131  ? [place] 92 02/12/2021 0943  ? ? ?CBC ?   ?Component Value Date/Time  ? WBC 8.4 07/21/2017 1230  ? RBC 5.45 (H) 07/21/2017 1230  ? HGB 13.8 07/21/2017 1230  ? HCT 42.7 07/21/2017 1230  ? PLT 238 07/21/2017 1230  ? MCV 78.3 07/21/2017 1230  ? MCH 25.3 (L) 07/21/2017 1230  ? MCHC 32.3 07/21/2017 1230  ? RDW 12.6 07/21/2017 1230  ? LYMPHSABS 2,592 10/07/2016 1131  ? MONOABS 672 10/07/2016 1131  ? EOSABS 96 10/07/2016 1131  ? BASOSABS 0 10/07/2016 1131  ? ? ?Lab Results  ?Component Value Date  ? HGBA1C 11.1 (A) 05/14/2021  ? ? ?Assessment & Plan:  ?1. Type 2 diabetes mellitus with hyperglycemia, without long-term current use of insulin  (HCC) ?Uncontrolled with A1c of 11.1; goal is less than 7.0 ?GLP-1 RA shortage has impacted glycemic control ?Increased dose of Mounjaro ?Continue glipizide and Lantus ?Counseled on Diabetic diet, my plate method, 978 minutes o

## 2021-05-14 NOTE — Patient Instructions (Signed)
Administer Mounjaro 5 mg/week for 4 weeks then increase to 7.5 mg/week thereafter until your next appointment. ?

## 2021-05-15 LAB — CMP14+EGFR
ALT: 16 IU/L (ref 0–32)
AST: 15 IU/L (ref 0–40)
Albumin/Globulin Ratio: 1.5 (ref 1.2–2.2)
Albumin: 4.2 g/dL (ref 3.8–4.8)
Alkaline Phosphatase: 64 IU/L (ref 44–121)
BUN/Creatinine Ratio: 11 (ref 9–23)
BUN: 8 mg/dL (ref 6–24)
Bilirubin Total: 0.5 mg/dL (ref 0.0–1.2)
CO2: 20 mmol/L (ref 20–29)
Calcium: 9.5 mg/dL (ref 8.7–10.2)
Chloride: 103 mmol/L (ref 96–106)
Creatinine, Ser: 0.73 mg/dL (ref 0.57–1.00)
Globulin, Total: 2.8 g/dL (ref 1.5–4.5)
Glucose: 199 mg/dL — ABNORMAL HIGH (ref 70–99)
Potassium: 4.5 mmol/L (ref 3.5–5.2)
Sodium: 139 mmol/L (ref 134–144)
Total Protein: 7 g/dL (ref 6.0–8.5)
eGFR: 105 mL/min/{1.73_m2} (ref >59)

## 2021-05-15 LAB — LP+NON-HDL CHOLESTEROL
Cholesterol, Total: 186 mg/dL (ref 100–199)
HDL: 45 mg/dL (ref >39)
LDL Chol Calc (NIH): 108 mg/dL — ABNORMAL HIGH (ref 0–99)
Total Non-HDL-Chol (LDL+VLDL): 141 mg/dL — ABNORMAL HIGH (ref 0–129)
Triglycerides: 189 mg/dL — ABNORMAL HIGH (ref 0–149)
VLDL Cholesterol Cal: 33 mg/dL (ref 5–40)

## 2021-08-13 ENCOUNTER — Other Ambulatory Visit: Payer: Self-pay | Admitting: Family Medicine

## 2021-08-13 DIAGNOSIS — E1165 Type 2 diabetes mellitus with hyperglycemia: Secondary | ICD-10-CM

## 2021-08-14 ENCOUNTER — Other Ambulatory Visit: Payer: Self-pay | Admitting: Family Medicine

## 2021-08-14 DIAGNOSIS — E1165 Type 2 diabetes mellitus with hyperglycemia: Secondary | ICD-10-CM

## 2021-08-14 MED ORDER — LANTUS SOLOSTAR 100 UNIT/ML ~~LOC~~ SOPN: 55.0000 [IU] | PEN_INJECTOR | Freq: Every day | SUBCUTANEOUS | 0 refills | Status: DC

## 2021-08-27 ENCOUNTER — Encounter: Payer: Self-pay | Admitting: Family Medicine

## 2021-08-27 ENCOUNTER — Ambulatory Visit: Payer: PRIVATE HEALTH INSURANCE | Attending: Family Medicine | Admitting: Family Medicine

## 2021-08-27 VITALS — BP 115/69 | HR 113 | Temp 98.7°F | Ht 67.0 in | Wt 284.6 lb

## 2021-08-27 DIAGNOSIS — F419 Anxiety disorder, unspecified: Secondary | ICD-10-CM

## 2021-08-27 DIAGNOSIS — E1169 Type 2 diabetes mellitus with other specified complication: Secondary | ICD-10-CM

## 2021-08-27 DIAGNOSIS — E1165 Type 2 diabetes mellitus with hyperglycemia: Secondary | ICD-10-CM | POA: Diagnosis not present

## 2021-08-27 DIAGNOSIS — E785 Hyperlipidemia, unspecified: Secondary | ICD-10-CM

## 2021-08-27 DIAGNOSIS — E1159 Type 2 diabetes mellitus with other circulatory complications: Secondary | ICD-10-CM

## 2021-08-27 DIAGNOSIS — B3731 Acute candidiasis of vulva and vagina: Secondary | ICD-10-CM

## 2021-08-27 DIAGNOSIS — I152 Hypertension secondary to endocrine disorders: Secondary | ICD-10-CM

## 2021-08-27 LAB — POCT GLYCOSYLATED HEMOGLOBIN (HGB A1C): HbA1c, POC (controlled diabetic range): 10.8 % — AB (ref 0.0–7.0)

## 2021-08-27 LAB — GLUCOSE, POCT (MANUAL RESULT ENTRY): POC Glucose: 240 mg/dl — AB (ref 70–99)

## 2021-08-27 MED ORDER — TERCONAZOLE 0.8 % VA CREA: 1.0000 | TOPICAL_CREAM | Freq: Every day | VAGINAL | 1 refills | Status: DC

## 2021-08-27 MED ORDER — ATORVASTATIN CALCIUM 80 MG PO TABS: 80.0000 mg | ORAL_TABLET | Freq: Every day | ORAL | 1 refills | Status: DC

## 2021-08-27 MED ORDER — INSULIN LISPRO (1 UNIT DIAL) 100 UNIT/ML (KWIKPEN): 0.0000 [IU] | PEN_INJECTOR | Freq: Three times a day (TID) | SUBCUTANEOUS | 11 refills | Status: DC

## 2021-08-27 MED ORDER — GLIPIZIDE 10 MG PO TABS: 20.0000 mg | ORAL_TABLET | Freq: Two times a day (BID) | ORAL | 1 refills | Status: DC

## 2021-08-27 MED ORDER — FLUCONAZOLE 150 MG PO TABS: 150.0000 mg | ORAL_TABLET | Freq: Every day | ORAL | 0 refills | Status: DC | PRN

## 2021-08-27 MED ORDER — BUSPIRONE HCL 7.5 MG PO TABS: 7.5000 mg | ORAL_TABLET | Freq: Two times a day (BID) | ORAL | 2 refills | Status: DC

## 2021-08-27 MED ORDER — LANTUS SOLOSTAR 100 UNIT/ML ~~LOC~~ SOPN: 60.0000 [IU] | PEN_INJECTOR | Freq: Every day | SUBCUTANEOUS | 6 refills | Status: DC

## 2021-08-27 MED ORDER — TIRZEPATIDE 10 MG/0.5ML ~~LOC~~ SOAJ: 10.0000 mg | SUBCUTANEOUS | 0 refills | Status: DC

## 2021-08-27 MED ORDER — LOSARTAN POTASSIUM 100 MG PO TABS
100.0000 mg | ORAL_TABLET | Freq: Every day | ORAL | 1 refills | Status: DC
Start: 2021-08-27 — End: 2022-03-18

## 2021-08-27 MED ORDER — TIRZEPATIDE 12.5 MG/0.5ML ~~LOC~~ SOAJ: 12.5000 mg | SUBCUTANEOUS | 6 refills | Status: DC

## 2021-08-27 NOTE — Patient Instructions (Signed)
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
## 2021-08-27 NOTE — Progress Notes (Signed)
Medication refills Discuss anxiety

## 2021-11-21 ENCOUNTER — Other Ambulatory Visit: Payer: Self-pay | Admitting: Family Medicine

## 2021-11-21 DIAGNOSIS — E1165 Type 2 diabetes mellitus with hyperglycemia: Secondary | ICD-10-CM

## 2021-11-24 ENCOUNTER — Other Ambulatory Visit: Payer: Self-pay | Admitting: Family Medicine

## 2021-11-24 DIAGNOSIS — F419 Anxiety disorder, unspecified: Secondary | ICD-10-CM

## 2021-12-17 ENCOUNTER — Ambulatory Visit: Payer: PRIVATE HEALTH INSURANCE | Attending: Family Medicine | Admitting: Family Medicine

## 2021-12-17 ENCOUNTER — Encounter: Payer: Self-pay | Admitting: Family Medicine

## 2021-12-17 VITALS — BP 137/88 | HR 95 | Wt 287.2 lb

## 2021-12-17 DIAGNOSIS — F419 Anxiety disorder, unspecified: Secondary | ICD-10-CM

## 2021-12-17 DIAGNOSIS — E1169 Type 2 diabetes mellitus with other specified complication: Secondary | ICD-10-CM

## 2021-12-17 DIAGNOSIS — E1165 Type 2 diabetes mellitus with hyperglycemia: Secondary | ICD-10-CM

## 2021-12-17 DIAGNOSIS — Z23 Encounter for immunization: Secondary | ICD-10-CM | POA: Diagnosis not present

## 2021-12-17 DIAGNOSIS — I152 Hypertension secondary to endocrine disorders: Secondary | ICD-10-CM

## 2021-12-17 DIAGNOSIS — E785 Hyperlipidemia, unspecified: Secondary | ICD-10-CM

## 2021-12-17 DIAGNOSIS — E1159 Type 2 diabetes mellitus with other circulatory complications: Secondary | ICD-10-CM

## 2021-12-17 LAB — GLUCOSE, POCT (MANUAL RESULT ENTRY): POC Glucose: 118 mg/dl — AB (ref 70–99)

## 2021-12-17 LAB — POCT GLYCOSYLATED HEMOGLOBIN (HGB A1C): HbA1c, POC (controlled diabetic range): 8.6 % — AB (ref 0.0–7.0)

## 2021-12-17 MED ORDER — BUSPIRONE HCL 10 MG PO TABS: 10.0000 mg | ORAL_TABLET | Freq: Two times a day (BID) | ORAL | 3 refills | Status: DC

## 2021-12-17 MED ORDER — TIRZEPATIDE 15 MG/0.5ML ~~LOC~~ SOAJ: 15.0000 mg | SUBCUTANEOUS | 6 refills | Status: DC

## 2021-12-17 NOTE — Progress Notes (Signed)
Patient stated that she also wanted her buspar dosage changed to a higher dose

## 2021-12-17 NOTE — Patient Instructions (Signed)

## 2021-12-17 NOTE — Progress Notes (Signed)
Subjective:  Patient ID: Lynn Anderson, female    DOB: 1980-05-01  Age: 41 y.o. MRN: [id number]  CC: Diabetes and Medication Refill   HPI Lynn Anderson is a 41 y.o. year old female with a history of Type 2 DM  (A1c 8.6). Hyperlipidemia, Hypertension, and obesity who comes in today for follow up for her chronic medical conditions.    Interval History: A1C of 8.6 down from 10.9 and she endorses adherence with her medications but she has not noticed any much weight loss on Mounjaro.  She tries to exercise by means of walking on some days of the week. Denies presence of visual concerns, neuropathy. Endorses adherence with her antihypertensive and her statin.  She is requesting increasing her dose of BuSpar as she does not feel her anxiety is well controlled on BuSpar. Denies additional concerns. Past Medical History:  Diagnosis Date   Diabetes mellitus without complication ([place])    Hyperlipidemia    Hypertension     Past Surgical History:  Procedure Laterality Date   CHOLECYSTECTOMY     Mirena      Inserted 10-16-14    Family History  Problem Relation Age of Onset   Hyperlipidemia Mother    Hypertension Mother    Diabetes Mother    Diabetes Maternal Grandmother    Heart disease Father    Diabetes Maternal Aunt     Social History   Socioeconomic History   Marital status: Single    Spouse name: Not on file   Number of children: Not on file   Years of education: Not on file   Highest education level: Not on file  Occupational History   Not on file  Tobacco Use   Smoking status: Never   Smokeless tobacco: Never  Vaping Use   Vaping Use: Never used  Substance and Sexual Activity   Alcohol use: No    Alcohol/week: 0.0 standard drinks of alcohol   Drug use: No   Sexual activity: Not Currently    Birth control/protection: I.U.D.    Comment: Mirena  inserted 10-16-14-INTERCOURSE AGE 13,MORE THAN 5  SEXUAL PARTNERS  Other Topics Concern   Not on file  Social  History Narrative   Not on file   Social Determinants of Health   Financial Resource Strain: Not on file  Food Insecurity: Not on file  Transportation Needs: Not on file  Physical Activity: Not on file  Stress: Not on file  Social Connections: Not on file    Allergies  Allergen Reactions   Metformin And Related Diarrhea    Outpatient Medications Prior to Visit  Medication Sig Dispense Refill   atorvastatin (LIPITOR) 80 MG tablet Take 1 tablet (80 mg total) by mouth daily. 90 tablet 1   Blood Glucose Monitoring Suppl (ONE TOUCH ULTRA 2) w/Device KIT 1 each by Does not apply route 3 (three) times daily. 1 each 0   fluconazole (DIFLUCAN) 150 MG tablet Take 1 tablet (150 mg total) by mouth daily as needed. 15 tablet 0   glipiZIDE (GLUCOTROL) 10 MG tablet Take 2 tablets (20 mg total) by mouth 2 (two) times daily before a meal. 360 tablet 1   glucose blood (ONETOUCH VERIO) test strip Use to test blood sugar 2 times daily as instructed 100 each 2   ibuprofen (ADVIL) 800 MG tablet Take 1 tablet (800 mg total) by mouth 3 (three) times daily. 21 tablet 0   insulin glargine (LANTUS SOLOSTAR) 100 UNIT/ML Solostar Pen Inject 60 Units  into the skin daily. 30 mL 6   insulin lispro (HUMALOG KWIKPEN) 100 UNIT/ML KwikPen Inject 0-12 Units into the skin 3 (three) times daily. Per sliding scale 15 mL 11   Insulin Pen Needle (BD PEN NEEDLE NANO U/F) 32G X 4 MM MISC Use as directed once daily to administer victoza and Lantus 100 each 3   Lancets (ONETOUCH ULTRASOFT) lancets Use as instructed 100 each 12   levonorgestrel (MIRENA) 20 MCG/24HR IUD 1 each by Intrauterine route once.     losartan (COZAAR) 100 MG tablet Take 1 tablet (100 mg total) by mouth daily. 90 tablet 1   ONETOUCH DELICA LANCETS FINE MISC Use to test blood sugar 2 times daily as instructed. 100 each 2   terconazole (TERAZOL 3) 0.8 % vaginal cream Place 1 applicator vaginally at bedtime. 20 g 1   busPIRone (BUSPAR) 7.5 MG tablet Take 1  tablet (7.5 mg total) by mouth 2 (two) times daily. 60 tablet 0   tirzepatide (MOUNJARO) 12.5 MG/0.5ML Pen Inject 12.5 mg into the skin once a week. 6 mL 1   No facility-administered medications prior to visit.     ROS Review of Systems  Constitutional:  Negative for activity change and appetite change.  HENT:  Negative for sinus pressure and sore throat.   Respiratory:  Negative for chest tightness, shortness of breath and wheezing.   Cardiovascular:  Negative for chest pain and palpitations.  Gastrointestinal:  Negative for abdominal distention, abdominal pain and constipation.  Genitourinary: Negative.   Musculoskeletal: Negative.   Psychiatric/Behavioral:  Negative for behavioral problems and dysphoric mood.     Objective:  BP 137/88   Pulse 95   Wt 287 lb 3.2 oz (130.3 kg)   SpO2 100%   BMI 44.98 kg/m      12/17/2021    2:42 PM 12/17/2021    2:13 PM 08/27/2021    2:28 PM  BP/Weight  Systolic BP 888 280 034  Diastolic BP 88 93 69  Wt. (Lbs)  287.2 284.6  BMI  44.98 kg/m2 44.57 kg/m2      Physical Exam Constitutional:      Appearance: She is well-developed.  Cardiovascular:     Rate and Rhythm: Normal rate.     Heart sounds: Normal heart sounds. No murmur heard. Pulmonary:     Effort: Pulmonary effort is normal.     Breath sounds: Normal breath sounds. No wheezing or rales.  Chest:     Chest wall: No tenderness.  Abdominal:     General: Bowel sounds are normal. There is no distension.     Palpations: Abdomen is soft. There is no mass.     Tenderness: There is no abdominal tenderness.  Musculoskeletal:        General: Normal range of motion.     Right lower leg: No edema.     Left lower leg: No edema.  Neurological:     Mental Status: She is alert and oriented to person, place, and time.  Psychiatric:        Mood and Affect: Mood normal.        Latest Ref Rng & Units 05/14/2021    2:49 PM 02/12/2021    9:43 AM 06/05/2020   10:58 AM  CMP  Glucose 70 -  99 mg/dL 199  180  261   BUN 6 - 24 mg/dL _0 Creatinine 0.57 - 1.00 mg/dL 0.73  0.80  0.76   Sodium 134 -  144 mmol/L 139  140  143   Potassium 3.5 - 5.2 mmol/L 4.5  4.6  4.9   Chloride 96 - 106 mmol/L 103  104  105   CO2 20 - 29 mmol/L _0 Calcium 8.7 - 10.2 mg/dL 9.5  9.2  9.9   Total Protein 6.0 - 8.5 g/dL 7.0  6.6  7.2   Total Bilirubin 0.0 - 1.2 mg/dL 0.5  0.4  0.4   Alkaline Phos 44 - 121 IU/L 64  67  68   AST 0 - 40 IU/L _1 ALT 0 - 32 IU/L _2 Lipid Panel     Component Value Date/Time   CHOL 186 05/14/2021 1449   TRIG 189 (H) 05/14/2021 1449   HDL 45 05/14/2021 1449   CHOLHDL 4.4 06/05/2020 1058   CHOLHDL 5.4 (H) 10/07/2016 1131   VLDL 55 (H) 10/07/2016 1131   LDLCALC 108 (H) 05/14/2021 1449    CBC    Component Value Date/Time   WBC 8.4 07/21/2017 1230   RBC 5.45 (H) 07/21/2017 1230   HGB 13.8 07/21/2017 1230   HCT 42.7 07/21/2017 1230   PLT 238 07/21/2017 1230   MCV 78.3 07/21/2017 1230   MCH 25.3 (L) 07/21/2017 1230   MCHC 32.3 07/21/2017 1230   RDW 12.6 07/21/2017 1230   LYMPHSABS 2,592 10/07/2016 1131   MONOABS 672 10/07/2016 1131   EOSABS 96 10/07/2016 1131   BASOSABS 0 10/07/2016 1131    Lab Results  Component Value Date   HGBA1C 8.6 (A) 12/17/2021    Assessment & Plan:  1. Type 2 diabetes mellitus with hyperglycemia, without long-term current use of insulin (HCC) Uncontrolled with A1c of 8.6 which has improved from 10.9 previously We will increase Mounjaro dose to maximum dose to achieve maximal weight loss benefit along with additional glycemic reduction Continue current dose of Lantus and Humalog Counseled on Diabetic diet, my plate method, 294 minutes of moderate intensity exercise/week Blood sugar logs with fasting goals of 80-120 mg/dl, random of less than 180 and in the event of sugars less than 60 mg/dl or greater than 400 mg/dl encouraged to notify the clinic. Advised on the need for annual eye exams,  annual foot exams, Pneumonia vaccine. - POCT glucose (manual entry) - POCT glycosylated hemoglobin (Hb A1C) - tirzepatide (MOUNJARO) 15 MG/0.5ML Pen; Inject 15 mg into the skin once a week.  Dispense: 6 mL; Refill: 6 - CMP14+EGFR - LP+Non-HDL Cholesterol  2. Anxiety Uncontrolled Increased dose of BuSpar as per patient request Advised that they have the option of addition of SSRI if anxiety remains uncontrolled - busPIRone (BUSPAR) 10 MG tablet; Take 1 tablet (10 mg total) by mouth 2 (two) times daily.  Dispense: 60 tablet; Refill: 3  3. Hyperlipidemia associated with type 2 diabetes mellitus ([place]) Slightly above goal Lipitor dose had been increased after her last set of labs in April 2023 We will recheck lipid panel Low-cholesterol diet  4. Hypertension associated with diabetes ([place]) Controlled Continue current antihypertensive Counseled on blood pressure goal of less than 130/80, low-sodium, DASH diet, medication compliance, 150 minutes of moderate intensity exercise per week. Discussed medication compliance, adverse effects.  5. Need for immunization against influenza - Flu Vaccine QUAD 36moIM (Fluarix, Fluzone & Alfiuria Quad PF)    Meds ordered this encounter  Medications   busPIRone (BUSPAR) 10 MG tablet    Sig: Take 1  tablet (10 mg total) by mouth 2 (two) times daily.    Dispense:  60 tablet    Refill:  3   tirzepatide (MOUNJARO) 15 MG/0.5ML Pen    Sig: Inject 15 mg into the skin once a week.    Dispense:  6 mL    Refill:  6    Follow-up: Return in about 3 months (around 03/19/2022) for Chronic medical conditions.       Charlott Rakes, MD, FAAFP. [facility] and [place], [place]   12/17/2021, 3:10 PM

## 2021-12-18 LAB — CMP14+EGFR
ALT: 15 IU/L (ref 0–32)
AST: 12 IU/L (ref 0–40)
Albumin/Globulin Ratio: 1.7 (ref 1.2–2.2)
Albumin: 4 g/dL (ref 3.9–4.9)
Alkaline Phosphatase: 67 IU/L (ref 44–121)
BUN/Creatinine Ratio: 10 (ref 9–23)
BUN: 7 mg/dL (ref 6–24)
Bilirubin Total: 0.4 mg/dL (ref 0.0–1.2)
CO2: 21 mmol/L (ref 20–29)
Calcium: 9.1 mg/dL (ref 8.7–10.2)
Chloride: 104 mmol/L (ref 96–106)
Creatinine, Ser: 0.7 mg/dL (ref 0.57–1.00)
Globulin, Total: 2.4 g/dL (ref 1.5–4.5)
Glucose: 96 mg/dL (ref 70–99)
Potassium: 4.6 mmol/L (ref 3.5–5.2)
Sodium: 141 mmol/L (ref 134–144)
Total Protein: 6.4 g/dL (ref 6.0–8.5)
eGFR: 110 mL/min/{1.73_m2} (ref >59)

## 2021-12-18 LAB — LP+NON-HDL CHOLESTEROL
Cholesterol, Total: 126 mg/dL (ref 100–199)
HDL: 42 mg/dL (ref >39)
LDL Chol Calc (NIH): 64 mg/dL (ref 0–99)
Total Non-HDL-Chol (LDL+VLDL): 84 mg/dL (ref 0–129)
Triglycerides: 108 mg/dL (ref 0–149)
VLDL Cholesterol Cal: 20 mg/dL (ref 5–40)

## 2022-02-10 ENCOUNTER — Other Ambulatory Visit: Payer: Self-pay

## 2022-03-05 ENCOUNTER — Other Ambulatory Visit: Payer: Self-pay | Admitting: Family Medicine

## 2022-03-05 DIAGNOSIS — E1165 Type 2 diabetes mellitus with hyperglycemia: Secondary | ICD-10-CM

## 2022-03-05 NOTE — Telephone Encounter (Signed)
Requested Prescriptions  Pending Prescriptions Disp Refills   glipiZIDE (GLUCOTROL) 10 MG tablet [Pharmacy Med Name: glipiZIDE 10 mg tablet (GLUCOTROL)] 360 tablet 1    Sig: Take 2 tablets (20 mg total) by mouth 2 (two) times daily before a meal.     Endocrinology:  Diabetes - Sulfonylureas Failed - 03/05/2022 12:20 PM      Failed - HBA1C is between 0 and 7.9 and within 180 days    HbA1c, POC (controlled diabetic range)  Date Value Ref Range Status  12/17/2021 8.6 (A) 0.0 - 7.0 % Final         Passed - Cr in normal range and within 360 days    Creat  Date Value Ref Range Status  10/07/2016 0.78 0.50 - 1.10 mg/dL Final   Creatinine, Ser  Date Value Ref Range Status  12/17/2021 0.70 0.57 - 1.00 mg/dL Final         Passed - Valid encounter within last 6 months    Recent Outpatient Visits           2 months ago Type 2 diabetes mellitus with hyperglycemia, without long-term current use of insulin ([place])   [place], [place], MD   6 months ago Type 2 diabetes mellitus with hyperglycemia, without long-term current use of insulin ([place])   [place], Mier, MD   9 months ago Type 2 diabetes mellitus with hyperglycemia, without long-term current use of insulin ([place])   [place], [place], MD   1 year ago Type 2 diabetes mellitus with hyperglycemia, without long-term current use of insulin ([place])   [place], [place], MD   1 year ago Type 2 diabetes mellitus with hyperglycemia, without long-term current use of insulin [facility])   [place], [place], MD       Future Appointments             In 1 week Charlott Rakes, MD [place]

## 2022-03-18 ENCOUNTER — Encounter: Payer: Self-pay | Admitting: Family Medicine

## 2022-03-18 ENCOUNTER — Ambulatory Visit: Payer: PRIVATE HEALTH INSURANCE | Attending: Family Medicine | Admitting: Family Medicine

## 2022-03-18 ENCOUNTER — Telehealth: Payer: Self-pay | Admitting: Family Medicine

## 2022-03-18 VITALS — BP 155/96 | HR 100 | Temp 98.1°F | Ht 68.0 in | Wt 288.0 lb

## 2022-03-18 DIAGNOSIS — E1169 Type 2 diabetes mellitus with other specified complication: Secondary | ICD-10-CM

## 2022-03-18 DIAGNOSIS — E1159 Type 2 diabetes mellitus with other circulatory complications: Secondary | ICD-10-CM

## 2022-03-18 DIAGNOSIS — B3731 Acute candidiasis of vulva and vagina: Secondary | ICD-10-CM

## 2022-03-18 DIAGNOSIS — F419 Anxiety disorder, unspecified: Secondary | ICD-10-CM

## 2022-03-18 DIAGNOSIS — E785 Hyperlipidemia, unspecified: Secondary | ICD-10-CM

## 2022-03-18 DIAGNOSIS — E1165 Type 2 diabetes mellitus with hyperglycemia: Secondary | ICD-10-CM

## 2022-03-18 DIAGNOSIS — Z6841 Body Mass Index (BMI) 40.0 and over, adult: Secondary | ICD-10-CM

## 2022-03-18 DIAGNOSIS — I152 Hypertension secondary to endocrine disorders: Secondary | ICD-10-CM

## 2022-03-18 LAB — POCT GLYCOSYLATED HEMOGLOBIN (HGB A1C): HbA1c, POC (controlled diabetic range): 8.7 % — AB (ref 0.0–7.0)

## 2022-03-18 LAB — GLUCOSE, POCT (MANUAL RESULT ENTRY): POC Glucose: 87 mg/dl (ref 70–99)

## 2022-03-18 MED ORDER — FREESTYLE LIBRE 2 READER DEVI: 0 refills | Status: AC

## 2022-03-18 MED ORDER — FLUCONAZOLE 150 MG PO TABS: 150.0000 mg | ORAL_TABLET | Freq: Every day | ORAL | 0 refills | Status: DC | PRN

## 2022-03-18 MED ORDER — FREESTYLE LIBRE 2 SENSOR MISC: 3 refills | Status: AC

## 2022-03-18 MED ORDER — LANTUS SOLOSTAR 100 UNIT/ML ~~LOC~~ SOPN: 62.0000 [IU] | PEN_INJECTOR | Freq: Every day | SUBCUTANEOUS | 6 refills | Status: DC

## 2022-03-18 MED ORDER — ATORVASTATIN CALCIUM 80 MG PO TABS: 80.0000 mg | ORAL_TABLET | Freq: Every day | ORAL | 1 refills | Status: DC

## 2022-03-18 MED ORDER — LOSARTAN POTASSIUM 100 MG PO TABS: 100.0000 mg | ORAL_TABLET | Freq: Every day | ORAL | 1 refills | Status: DC

## 2022-03-18 MED ORDER — BUSPIRONE HCL 15 MG PO TABS: 15.0000 mg | ORAL_TABLET | Freq: Two times a day (BID) | ORAL | 1 refills | Status: DC

## 2022-03-18 NOTE — Patient Instructions (Signed)

## 2022-03-18 NOTE — Progress Notes (Signed)
Subjective:  Patient ID: Lynn Anderson, female    DOB: 06-08-1980  Age: 42 y.o. MRN: [id number]  CC: Diabetes   HPI Lynn Anderson is a 42 y.o. year old female with a history of Type 2 DM  (A1c 8.7). Hyperlipidemia, Hypertension, and obesity who comes in today for follow up for her chronic medical conditions.     Interval History:  She states her anxiety has not been controlled on current dose of BuSpar and she would like to increase it to 50 mg twice daily.  She is not open to an SSRI but would like an increased dose of BuSpar first.  Her blood pressure is elevated and she states she woke up today with a headache. At last visit Mounjaro was increased to maximum dose of 15 mg and she has been adherent with this along with her Lantus and her Humalog per sliding scale.  She states her fasting sugars have been in the 120s and she has not had any hypoglycemic episodes. She has not been exercising much. She is requesting a refill of Diflucan tablets which she uses for yeast infections. Past Medical History:  Diagnosis Date   Diabetes mellitus without complication (HCC)    Hyperlipidemia    Hypertension     Past Surgical History:  Procedure Laterality Date   CHOLECYSTECTOMY     Mirena      Inserted 10-16-14    Family History  Problem Relation Age of Onset   Hyperlipidemia Mother    Hypertension Mother    Diabetes Mother    Diabetes Maternal Grandmother    Heart disease Father    Diabetes Maternal Aunt     Social History   Socioeconomic History   Marital status: Single    Spouse name: Not on file   Number of children: Not on file   Years of education: Not on file   Highest education level: Not on file  Occupational History   Not on file  Tobacco Use   Smoking status: Never   Smokeless tobacco: Never  Vaping Use   Vaping Use: Never used  Substance and Sexual Activity   Alcohol use: No    Alcohol/week: 0.0 standard drinks of alcohol   Drug use: No   Sexual  activity: Not Currently    Birth control/protection: I.U.D.    Comment: Mirena  inserted 10-16-14-INTERCOURSE AGE 42,MORE THAN 5  SEXUAL PARTNERS  Other Topics Concern   Not on file  Social History Narrative   Not on file   Social Determinants of Health   Financial Resource Strain: Not on file  Food Insecurity: Not on file  Transportation Needs: Not on file  Physical Activity: Not on file  Stress: Not on file  Social Connections: Not on file    Allergies  Allergen Reactions   Metformin And Related Diarrhea    Outpatient Medications Prior to Visit  Medication Sig Dispense Refill   Blood Glucose Monitoring Suppl (ONE TOUCH ULTRA 2) w/Device KIT 1 each by Does not apply route 3 (three) times daily. 1 each 0   glipiZIDE (GLUCOTROL) 10 MG tablet Take 2 tablets (20 mg total) by mouth 2 (two) times daily before a meal. 360 tablet 1   glucose blood (ONETOUCH VERIO) test strip Use to test blood sugar 2 times daily as instructed 100 each 2   ibuprofen (ADVIL) 800 MG tablet Take 1 tablet (800 mg total) by mouth 3 (three) times daily. 21 tablet 0   insulin lispro (HUMALOG  KWIKPEN) 100 UNIT/ML KwikPen Inject 0-12 Units into the skin 3 (three) times daily. Per sliding scale 15 mL 11   Insulin Pen Needle (BD PEN NEEDLE NANO U/F) 32G X 4 MM MISC Use as directed once daily to administer victoza and Lantus 100 each 3   Lancets (ONETOUCH ULTRASOFT) lancets Use as instructed 100 each 12   levonorgestrel (MIRENA) 20 MCG/24HR IUD 1 each by Intrauterine route once.     ONETOUCH DELICA LANCETS FINE MISC Use to test blood sugar 2 times daily as instructed. 100 each 2   terconazole (TERAZOL 3) 0.8 % vaginal cream Place 1 applicator vaginally at bedtime. 20 g 1   tirzepatide (MOUNJARO) 15 MG/0.5ML Pen Inject 15 mg into the skin once a week. 6 mL 6   atorvastatin (LIPITOR) 80 MG tablet Take 1 tablet (80 mg total) by mouth daily. 90 tablet 1   busPIRone (BUSPAR) 10 MG tablet Take 1 tablet (10 mg total) by  mouth 2 (two) times daily. 60 tablet 3   fluconazole (DIFLUCAN) 150 MG tablet Take 1 tablet (150 mg total) by mouth daily as needed. 15 tablet 0   insulin glargine (LANTUS SOLOSTAR) 100 UNIT/ML Solostar Pen Inject 60 Units into the skin daily. 30 mL 6   losartan (COZAAR) 100 MG tablet Take 1 tablet (100 mg total) by mouth daily. 90 tablet 1   No facility-administered medications prior to visit.     ROS Review of Systems  Constitutional:  Negative for activity change and appetite change.  HENT:  Negative for sinus pressure and sore throat.   Respiratory:  Negative for chest tightness, shortness of breath and wheezing.   Cardiovascular:  Negative for chest pain and palpitations.  Gastrointestinal:  Negative for abdominal distention, abdominal pain and constipation.  Genitourinary: Negative.   Musculoskeletal: Negative.   Neurological:  Positive for headaches.  Psychiatric/Behavioral:  Negative for behavioral problems and dysphoric mood.     Objective:  BP (!) 155/96   Pulse 100   Temp 98.1 F (36.7 C) (Oral)   Ht 5\' 8"  (1.727 m)   Wt 288 lb (130.6 kg)   SpO2 100%   BMI 43.79 kg/m      03/18/2022    2:57 PM 03/18/2022    2:22 PM 12/17/2021    2:42 PM  BP/Weight  Systolic BP 423 536 144  Diastolic BP 96 315 88  Wt. (Lbs)  288   BMI  43.79 kg/m2     Wt Readings from Last 3 Encounters:  03/18/22 288 lb (130.6 kg)  12/17/21 287 lb 3.2 oz (130.3 kg)  08/27/21 284 lb 9.6 oz (129.1 kg)      Physical Exam Constitutional:      Appearance: She is well-developed. She is obese.  Cardiovascular:     Rate and Rhythm: Normal rate.     Heart sounds: Normal heart sounds. No murmur heard. Pulmonary:     Effort: Pulmonary effort is normal.     Breath sounds: Normal breath sounds. No wheezing or rales.  Chest:     Chest wall: No tenderness.  Abdominal:     General: Bowel sounds are normal. There is no distension.     Palpations: Abdomen is soft. There is no mass.      Tenderness: There is no abdominal tenderness.  Musculoskeletal:        General: Normal range of motion.     Right lower leg: No edema.     Left lower leg: No edema.  Neurological:  Mental Status: She is alert and oriented to person, place, and time.  Psychiatric:        Mood and Affect: Mood normal.        Latest Ref Rng & Units 12/17/2021    2:48 PM 05/14/2021    2:49 PM 02/12/2021    9:43 AM  CMP  Glucose 70 - 99 mg/dL 96  199  180   BUN 6 - 24 mg/dL 7  8  9    Creatinine 0.57 - 1.00 mg/dL 0.70  0.73  0.80   Sodium 134 - 144 mmol/L 141  139  140   Potassium 3.5 - 5.2 mmol/L 4.6  4.5  4.6   Chloride 96 - 106 mmol/L 104  103  104   CO2 20 - 29 mmol/L 21  20  23    Calcium 8.7 - 10.2 mg/dL 9.1  9.5  9.2   Total Protein 6.0 - 8.5 g/dL 6.4  7.0  6.6   Total Bilirubin 0.0 - 1.2 mg/dL 0.4  0.5  0.4   Alkaline Phos 44 - 121 IU/L 67  64  67   AST 0 - 40 IU/L 12  15  15    ALT 0 - 32 IU/L 15  16  18      Lipid Panel     Component Value Date/Time   CHOL 126 12/17/2021 1448   TRIG 108 12/17/2021 1448   HDL 42 12/17/2021 1448   CHOLHDL 4.4 06/05/2020 1058   CHOLHDL 5.4 (H) 10/07/2016 1131   VLDL 55 (H) 10/07/2016 1131   LDLCALC 64 12/17/2021 1448    CBC    Component Value Date/Time   WBC 8.4 07/21/2017 1230   RBC 5.45 (H) 07/21/2017 1230   HGB 13.8 07/21/2017 1230   HCT 42.7 07/21/2017 1230   PLT 238 07/21/2017 1230   MCV 78.3 07/21/2017 1230   MCH 25.3 (L) 07/21/2017 1230   MCHC 32.3 07/21/2017 1230   RDW 12.6 07/21/2017 1230   LYMPHSABS 2,592 10/07/2016 1131   MONOABS 672 10/07/2016 1131   EOSABS 96 10/07/2016 1131   BASOSABS 0 10/07/2016 1131    Lab Results  Component Value Date   HGBA1C 8.7 (A) 03/18/2022    Assessment & Plan:  1. Type 2 diabetes mellitus with hyperglycemia, without long-term current use of insulin (HCC) Uncontrolled with A1c of 8.7 She could not tolerate Farxiga in the past due to yeast infections Fasting sugars are uncontrolled hence I  will increase Lantus from 60 units to 62 units and she has been advised to uptitrate by additional 2 units if blood sugars remain above goal Continue Humalog sliding scale She will benefit from CGM which will be sent to her pharmacy Counseled on Diabetic diet, my plate method, 361 minutes of moderate intensity exercise/week Blood sugar logs with fasting goals of 80-120 mg/dl, random of less than 180 and in the event of sugars less than 60 mg/dl or greater than 400 mg/dl encouraged to notify the clinic. Advised on the need for annual eye exams, annual foot exams, Pneumonia vaccine. - POCT glucose (manual entry) - POCT glycosylated hemoglobin (Hb A1C) - Microalbumin / creatinine urine ratio - LP+Non-HDL Cholesterol - CMP14+EGFR - insulin glargine (LANTUS SOLOSTAR) 100 UNIT/ML Solostar Pen; Inject 62 Units into the skin daily.  Dispense: 30 mL; Refill: 6  2. Anxiety Uncontrolled Advised of the benefit of SSRI but she is not open to this She would like to increase BuSpar dose first and I have increased this from  10 mg to 15 mg twice daily - busPIRone (BUSPAR) 15 MG tablet; Take 1 tablet (15 mg total) by mouth 2 (two) times daily.  Dispense: 180 tablet; Refill: 1  3. Vaginal candidiasis Due to recurrent hyperglycemic state - fluconazole (DIFLUCAN) 150 MG tablet; Take 1 tablet (150 mg total) by mouth daily as needed.  Dispense: 15 tablet; Refill: 0  4. Hyperlipidemia associated with type 2 diabetes mellitus (HCC) Controlled Low-cholesterol diet - atorvastatin (LIPITOR) 80 MG tablet; Take 1 tablet (80 mg total) by mouth daily.  Dispense: 90 tablet; Refill: 1  5. Hypertension associated with diabetes ([place]) Uncontrolled She attributes elevated blood pressure to a headache Blood pressure at last visit was controlled hence I will make no regimen changes today Counseled on blood pressure goal of less than 130/80, low-sodium, DASH diet, medication compliance, 150 minutes of moderate intensity  exercise per week. Discussed medication compliance, adverse effects. - losartan (COZAAR) 100 MG tablet; Take 1 tablet (100 mg total) by mouth daily.  Dispense: 90 tablet; Refill: 1  6. Class 3 severe obesity due to excess calories with serious comorbidity and body mass index (BMI) of 40.0 to 44.9 [facility]) Unfortunately she has not lost much weight with Mounjaro Counseled on the importance of exercising, caloric restriction    Meds ordered this encounter  Medications   busPIRone (BUSPAR) 15 MG tablet    Sig: Take 1 tablet (15 mg total) by mouth 2 (two) times daily.    Dispense:  180 tablet    Refill:  1    Dose increase   insulin glargine (LANTUS SOLOSTAR) 100 UNIT/ML Solostar Pen    Sig: Inject 62 Units into the skin daily.    Dispense:  30 mL    Refill:  6    Dose increase   fluconazole (DIFLUCAN) 150 MG tablet    Sig: Take 1 tablet (150 mg total) by mouth daily as needed.    Dispense:  15 tablet    Refill:  0   atorvastatin (LIPITOR) 80 MG tablet    Sig: Take 1 tablet (80 mg total) by mouth daily.    Dispense:  90 tablet    Refill:  1    Dose increase   losartan (COZAAR) 100 MG tablet    Sig: Take 1 tablet (100 mg total) by mouth daily.    Dispense:  90 tablet    Refill:  1    Follow-up: Return in about 3 months (around 06/16/2022) for Chronic medical conditions.       Charlott Rakes, MD, FAAFP. [facility] and [place], [place]   03/18/2022, 3:58 PM

## 2022-03-18 NOTE — Addendum Note (Signed)
Addended by: Daisy Blossom, Annie Main L on: 03/18/2022 03:18 PM   Modules accepted: Orders

## 2022-03-18 NOTE — Progress Notes (Signed)
Refill on diflucan

## 2022-03-18 NOTE — Telephone Encounter (Signed)
Can you please send in a CGM which is covered by her insurance to the pharmacy?  Thank you.

## 2022-03-18 NOTE — Telephone Encounter (Signed)
Yes ma'am, done. 

## 2022-03-19 ENCOUNTER — Other Ambulatory Visit: Payer: Self-pay | Admitting: Family Medicine

## 2022-03-19 DIAGNOSIS — E876 Hypokalemia: Secondary | ICD-10-CM

## 2022-03-20 LAB — CMP14+EGFR
ALT: 16 IU/L (ref 0–32)
AST: 11 IU/L (ref 0–40)
Albumin/Globulin Ratio: 1.5 (ref 1.2–2.2)
Albumin: 4.1 g/dL (ref 3.9–4.9)
Alkaline Phosphatase: 67 IU/L (ref 44–121)
BUN/Creatinine Ratio: 10 (ref 9–23)
BUN: 7 mg/dL (ref 6–24)
Bilirubin Total: 0.4 mg/dL (ref 0.0–1.2)
CO2: 20 mmol/L (ref 20–29)
Calcium: 8.8 mg/dL (ref 8.7–10.2)
Chloride: 101 mmol/L (ref 96–106)
Creatinine, Ser: 0.71 mg/dL (ref 0.57–1.00)
Globulin, Total: 2.7 g/dL (ref 1.5–4.5)
Glucose: 87 mg/dL (ref 70–99)
Potassium: 3.2 mmol/L — ABNORMAL LOW (ref 3.5–5.2)
Sodium: 138 mmol/L (ref 134–144)
Total Protein: 6.8 g/dL (ref 6.0–8.5)
eGFR: 108 mL/min/{1.73_m2} (ref >59)

## 2022-03-20 LAB — LP+NON-HDL CHOLESTEROL
Cholesterol, Total: 151 mg/dL (ref 100–199)
HDL: 47 mg/dL (ref >39)
LDL Chol Calc (NIH): 81 mg/dL (ref 0–99)
Total Non-HDL-Chol (LDL+VLDL): 104 mg/dL (ref 0–129)
Triglycerides: 132 mg/dL (ref 0–149)
VLDL Cholesterol Cal: 23 mg/dL (ref 5–40)

## 2022-03-20 LAB — MICROALBUMIN / CREATININE URINE RATIO
Creatinine, Urine: 104.1 mg/dL
Microalb/Creat Ratio: 29 mg/g creat (ref 0–29)
Microalbumin, Urine: 30 ug/mL

## 2022-04-16 ENCOUNTER — Other Ambulatory Visit: Payer: Self-pay | Admitting: Family Medicine

## 2022-04-16 DIAGNOSIS — E1165 Type 2 diabetes mellitus with hyperglycemia: Secondary | ICD-10-CM

## 2022-04-16 NOTE — Telephone Encounter (Signed)
Requested medication (s) are due for refill today: expired medication  Requested medication (s) are on the active medication list: yes  Last refill:  12/07/20 #100 3 refills  Future visit scheduled: yes in 2 months   Notes to clinic:  expired medication . Do you want to renew Rx?     Requested Prescriptions  Pending Prescriptions Disp Refills   BD PEN NEEDLE NANO U/F 32G X 4 MM MISC [Pharmacy Med Name: pen needle, diabetic 32 gauge x 5/32"] 100 each 3    Sig: Use as directed once daily to administer Victoza & Lantus     Endocrinology: Diabetes - Testing Supplies Passed - 04/16/2022 10:10 AM      Passed - Valid encounter within last 12 months    Recent Outpatient Visits           4 weeks ago Type 2 diabetes mellitus with hyperglycemia, without long-term current use of insulin ([place])   [place], [place], MD   4 months ago Type 2 diabetes mellitus with hyperglycemia, without long-term current use of insulin ([place])   [place], [place], MD   7 months ago Type 2 diabetes mellitus with hyperglycemia, without long-term current use of insulin ([place])   [place], [place], MD   11 months ago Type 2 diabetes mellitus with hyperglycemia, without long-term current use of insulin ([place])   [place], [place], MD   1 year ago Type 2 diabetes mellitus with hyperglycemia, without long-term current use of insulin [facility])   [place] Charlott Rakes, MD       Future Appointments             In 2 months Charlott Rakes, MD [place]

## 2022-05-13 ENCOUNTER — Telehealth: Payer: Self-pay | Admitting: Family Medicine

## 2022-05-13 NOTE — Telephone Encounter (Signed)
Copied from [place] [id number]. Topic: General - Other >> May 13, 2022  4:15 PM Dominique A wrote:  Reason for CRM: Pt states that she has not been on her medication (tirzepatide [facility]) 15 MG/0.5ML Pen) for a month due to it being on back order. Pt is wanting to know if PCP could give her some medication until hers is available. Please call pt back.

## 2022-05-14 NOTE — Telephone Encounter (Signed)
Routing to PCP for review.

## 2022-05-15 MED ORDER — SEMAGLUTIDE (2 MG/DOSE) 8 MG/3ML ~~LOC~~ SOPN: 2.0000 mg | PEN_INJECTOR | SUBCUTANEOUS | 1 refills | Status: DC

## 2022-05-15 NOTE — Addendum Note (Signed)
Addended by: Hoy Register on: 05/15/2022 08:43 AM   Modules accepted: Orders

## 2022-05-15 NOTE — Telephone Encounter (Signed)
Please inform her I sent a prescription for Ozempic to the pharmacy which she will use until [facility] becomes available.  Thank you

## 2022-05-15 NOTE — Telephone Encounter (Signed)
Patient has been informed of medication being sent to pharmacy. 

## 2022-06-17 ENCOUNTER — Ambulatory Visit: Payer: PRIVATE HEALTH INSURANCE | Admitting: Family Medicine

## 2022-06-17 ENCOUNTER — Ambulatory Visit: Payer: PRIVATE HEALTH INSURANCE | Attending: Family Medicine | Admitting: Family Medicine

## 2022-06-17 ENCOUNTER — Encounter: Payer: Self-pay | Admitting: Family Medicine

## 2022-06-17 ENCOUNTER — Telehealth: Payer: Self-pay | Admitting: Licensed Clinical Social Worker

## 2022-06-17 VITALS — BP 145/91 | HR 97 | Ht 67.0 in | Wt 281.0 lb

## 2022-06-17 DIAGNOSIS — E876 Hypokalemia: Secondary | ICD-10-CM | POA: Diagnosis not present

## 2022-06-17 DIAGNOSIS — Z7985 Long-term (current) use of injectable non-insulin antidiabetic drugs: Secondary | ICD-10-CM

## 2022-06-17 DIAGNOSIS — E1165 Type 2 diabetes mellitus with hyperglycemia: Secondary | ICD-10-CM

## 2022-06-17 DIAGNOSIS — E1169 Type 2 diabetes mellitus with other specified complication: Secondary | ICD-10-CM

## 2022-06-17 DIAGNOSIS — E785 Hyperlipidemia, unspecified: Secondary | ICD-10-CM

## 2022-06-17 DIAGNOSIS — Z794 Long term (current) use of insulin: Secondary | ICD-10-CM

## 2022-06-17 DIAGNOSIS — F419 Anxiety disorder, unspecified: Secondary | ICD-10-CM

## 2022-06-17 DIAGNOSIS — Z7984 Long term (current) use of oral hypoglycemic drugs: Secondary | ICD-10-CM | POA: Diagnosis not present

## 2022-06-17 DIAGNOSIS — I152 Hypertension secondary to endocrine disorders: Secondary | ICD-10-CM

## 2022-06-17 LAB — POCT GLYCOSYLATED HEMOGLOBIN (HGB A1C): HbA1c, POC (controlled diabetic range): 9.8 % — AB (ref 0.0–7.0)

## 2022-06-17 MED ORDER — VALSARTAN 320 MG PO TABS
320.0000 mg | ORAL_TABLET | Freq: Every day | ORAL | 1 refills | Status: DC
Start: 2022-06-17 — End: 2022-09-23

## 2022-06-17 NOTE — Telephone Encounter (Signed)
Met with client today during a warm hand off. Client is experiencing some anxiety and has scheduled an appointment to see me.

## 2022-06-17 NOTE — Patient Instructions (Signed)

## 2022-06-17 NOTE — Progress Notes (Signed)
Subjective:  Patient ID: Lynn Anderson, female    DOB: Apr 13, 1980  Age: 42 y.o. MRN: [id number]  CC: Diabetes   HPI Lynn Anderson is a 42 y.o. year old female with a history of Type 2 DM  (A1c 9.8). Hyperlipidemia, Hypertension, and obesity who comes in today for follow up for her chronic medical conditions.    Interval History:  A1C is 9.8 up from 8.7 previously. The pharmacy had been out of [facility] and she was without it for 2 to 3 weeks after which we had substituted with Ozempic.  She now has Mounjaro 15 mg and has been adherent with it in addition to her glipizide and Lantus. Her lowest blood sugar has been 91 and 180 was the highest.  She has not had hypoglycemia.  She has had a headache for the last couple of days off and on but is absent at the moment.  Blood pressure is slightly elevated. Her sleep has been good. She has the normal allergy symptoms like sneezing.  Anxiety is uncontrolled on current dose of BuSpar she has not tried counseling.  At a previous visit we had discussed initiation of SSRI but she had wanted to increase BuSpar dose instead. Past Medical History:  Diagnosis Date   Diabetes mellitus without complication (HCC)    Hyperlipidemia    Hypertension     Past Surgical History:  Procedure Laterality Date   CHOLECYSTECTOMY     Mirena      Inserted 10-16-14    Family History  Problem Relation Age of Onset   Hyperlipidemia Mother    Hypertension Mother    Diabetes Mother    Diabetes Maternal Grandmother    Heart disease Father    Diabetes Maternal Aunt     Social History   Socioeconomic History   Marital status: Single    Spouse name: Not on file   Number of children: Not on file   Years of education: Not on file   Highest education level: Not on file  Occupational History   Not on file  Tobacco Use   Smoking status: Never   Smokeless tobacco: Never  Vaping Use   Vaping Use: Never used  Substance and Sexual Activity   Alcohol  use: No    Alcohol/week: 0.0 standard drinks of alcohol   Drug use: No   Sexual activity: Not Currently    Birth control/protection: I.U.D.    Comment: Mirena  inserted 10-16-14-INTERCOURSE AGE 7,MORE THAN 5  SEXUAL PARTNERS  Other Topics Concern   Not on file  Social History Narrative   Not on file   Social Determinants of Health   Financial Resource Strain: Not on file  Food Insecurity: Not on file  Transportation Needs: Not on file  Physical Activity: Not on file  Stress: Not on file  Social Connections: Not on file    Allergies  Allergen Reactions   Metformin And Related Diarrhea    Outpatient Medications Prior to Visit  Medication Sig Dispense Refill   atorvastatin (LIPITOR) 80 MG tablet Take 1 tablet (80 mg total) by mouth daily. 90 tablet 1   Blood Glucose Monitoring Suppl (ONE TOUCH ULTRA 2) w/Device KIT 1 each by Does not apply route 3 (three) times daily. 1 each 0   busPIRone (BUSPAR) 15 MG tablet Take 1 tablet (15 mg total) by mouth 2 (two) times daily. 180 tablet 1   Continuous Blood Gluc Receiver (FREESTYLE LIBRE 2 READER) DEVI Use to check blood sugar  three times daily. E11.65 1 each 0   Continuous Blood Gluc Sensor (FREESTYLE LIBRE 2 SENSOR) MISC Use to check blood sugar three times daily. Change sensors once every 2 weeks. E11.65 2 each 3   fluconazole (DIFLUCAN) 150 MG tablet Take 1 tablet (150 mg total) by mouth daily as needed. 15 tablet 0   glipiZIDE (GLUCOTROL) 10 MG tablet Take 2 tablets (20 mg total) by mouth 2 (two) times daily before a meal. 360 tablet 1   glucose blood (ONETOUCH VERIO) test strip Use to test blood sugar 2 times daily as instructed 100 each 2   ibuprofen (ADVIL) 800 MG tablet Take 1 tablet (800 mg total) by mouth 3 (three) times daily. 21 tablet 0   insulin glargine (LANTUS SOLOSTAR) 100 UNIT/ML Solostar Pen Inject 62 Units into the skin daily. 30 mL 6   insulin lispro (HUMALOG KWIKPEN) 100 UNIT/ML KwikPen Inject 0-12 Units into the skin  3 (three) times daily. Per sliding scale 15 mL 11   Insulin Pen Needle (BD PEN NEEDLE NANO U/F) 32G X 4 MM MISC Use as directed once daily to administer Victoza & Lantus 100 each 3   Lancets (ONETOUCH ULTRASOFT) lancets Use as instructed 100 each 12   levonorgestrel (MIRENA) 20 MCG/24HR IUD 1 each by Intrauterine route once.     ONETOUCH DELICA LANCETS FINE MISC Use to test blood sugar 2 times daily as instructed. 100 each 2   terconazole (TERAZOL 3) 0.8 % vaginal cream Place 1 applicator vaginally at bedtime. 20 g 1   tirzepatide (MOUNJARO) 15 MG/0.5ML Pen Inject 15 mg into the skin once a week. 6 mL 6   losartan (COZAAR) 100 MG tablet Take 1 tablet (100 mg total) by mouth daily. 90 tablet 1   Semaglutide, 2 MG/DOSE, 8 MG/3ML SOPN Inject 2 mg as directed once a week. (Patient not taking: Reported on 06/17/2022) 3 mL 1   No facility-administered medications prior to visit.     ROS Review of Systems  Constitutional:  Negative for activity change and appetite change.  HENT:  Negative for sinus pressure and sore throat.   Respiratory:  Negative for chest tightness, shortness of breath and wheezing.   Cardiovascular:  Negative for chest pain and palpitations.  Gastrointestinal:  Negative for abdominal distention, abdominal pain and constipation.  Genitourinary: Negative.   Musculoskeletal: Negative.   Psychiatric/Behavioral:  Negative for behavioral problems and dysphoric mood.     Objective:  BP (!) 145/91   Pulse 97   Ht 5\' 7"  (1.702 m)   Wt 281 lb (127.5 kg)   SpO2 100%   BMI 44.01 kg/m      06/17/2022    4:09 PM 06/17/2022    3:30 PM 03/18/2022    2:57 PM  BP/Weight  Systolic BP 145 149 155  Diastolic BP 91 92 96  Wt. (Lbs)  281   BMI  44.01 kg/m2       Physical Exam Constitutional:      Appearance: She is well-developed.  Cardiovascular:     Rate and Rhythm: Normal rate.     Heart sounds: Normal heart sounds. No murmur heard. Pulmonary:     Effort: Pulmonary effort is  normal.     Breath sounds: Normal breath sounds. No wheezing or rales.  Chest:     Chest wall: No tenderness.  Abdominal:     General: Bowel sounds are normal. There is no distension.     Palpations: Abdomen is soft. There is no  mass.     Tenderness: There is no abdominal tenderness.  Musculoskeletal:        General: Normal range of motion.     Right lower leg: No edema.     Left lower leg: No edema.  Neurological:     Mental Status: She is alert and oriented to person, place, and time.  Psychiatric:        Mood and Affect: Mood normal.        Latest Ref Rng & Units 03/18/2022    3:05 PM 12/17/2021    2:48 PM 05/14/2021    2:49 PM  CMP  Glucose 70 - 99 mg/dL 87  96  409   BUN 6 - 24 mg/dL 7  7  8    Creatinine 0.57 - 1.00 mg/dL 8.11  9.14  7.82   Sodium 134 - 144 mmol/L 138  141  139   Potassium 3.5 - 5.2 mmol/L 3.2  4.6  4.5   Chloride 96 - 106 mmol/L 101  104  103   CO2 20 - 29 mmol/L 20  21  20    Calcium 8.7 - 10.2 mg/dL 8.8  9.1  9.5   Total Protein 6.0 - 8.5 g/dL 6.8  6.4  7.0   Total Bilirubin 0.0 - 1.2 mg/dL 0.4  0.4  0.5   Alkaline Phos 44 - 121 IU/L 67  67  64   AST 0 - 40 IU/L 11  12  15    ALT 0 - 32 IU/L 16  15  16      Lipid Panel     Component Value Date/Time   CHOL 151 03/18/2022 1505   TRIG 132 03/18/2022 1505   HDL 47 03/18/2022 1505   CHOLHDL 4.4 06/05/2020 1058   CHOLHDL 5.4 (H) 10/07/2016 1131   VLDL 55 (H) 10/07/2016 1131   LDLCALC 81 03/18/2022 1505    CBC    Component Value Date/Time   WBC 8.4 07/21/2017 1230   RBC 5.45 (H) 07/21/2017 1230   HGB 13.8 07/21/2017 1230   HCT 42.7 07/21/2017 1230   PLT 238 07/21/2017 1230   MCV 78.3 07/21/2017 1230   MCH 25.3 (L) 07/21/2017 1230   MCHC 32.3 07/21/2017 1230   RDW 12.6 07/21/2017 1230   LYMPHSABS 2,592 10/07/2016 1131   MONOABS 672 10/07/2016 1131   EOSABS 96 10/07/2016 1131   BASOSABS 0 10/07/2016 1131    Lab Results  Component Value Date   HGBA1C 9.8 (A) 06/17/2022    Assessment &  Plan:  1. Type 2 diabetes mellitus with hyperglycemia, without long-term current use of insulin (HCC) Uncontrolled with A1c of 9.8, goal is less than 7.0 Blood sugars are improving hence I will make no regimen change She does have a CGM but does not have her reader here with her If A1c remains elevated at next visit consider endocrine referral Counseled on Diabetic diet, my plate method, 956 minutes of moderate intensity exercise/week Blood sugar logs with fasting goals of 80-120 mg/dl, random of less than 213 and in the event of sugars less than 60 mg/dl or greater than 086 mg/dl encouraged to notify the clinic. Advised on the need for annual eye exams, annual foot exams, Pneumonia vaccine.  - POCT glycosylated hemoglobin (Hb A1C)  2. Hypokalemia - Potassium  3. Anxiety Uncontrolled on BuSpar LCSW called in for counseling Consider initiation of SSRI if symptoms remain uncontrolled  4. Hyperlipidemia associated with type 2 diabetes mellitus (HCC) Controlled Low-cholesterol diet Continue statin  5. Hypertension associated with diabetes (HCC) Uncontrolled Losartan substituted with valsartan Elevated blood pressure could also be causing her headaches Counseled on blood pressure goal of less than 130/80, low-sodium, DASH diet, medication compliance, 150 minutes of moderate intensity exercise per week. Discussed medication compliance, adverse effects. - valsartan (DIOVAN) 320 MG tablet; Take 1 tablet (320 mg total) by mouth daily.  Dispense: 90 tablet; Refill: 1   Meds ordered this encounter  Medications   valsartan (DIOVAN) 320 MG tablet    Sig: Take 1 tablet (320 mg total) by mouth daily.    Dispense:  90 tablet    Refill:  1    Discontinue Losartan    Follow-up: Return in about 3 months (around 09/17/2022) for Chronic medical conditions.       Hoy Register, MD, FAAFP. [facility] [place], [place] [phone]   06/17/2022, 6:02 PM

## 2022-06-18 LAB — POTASSIUM: Potassium: 4.4 mmol/L (ref 3.5–5.2)

## 2022-06-24 ENCOUNTER — Ambulatory Visit
Payer: PRIVATE HEALTH INSURANCE | Attending: Licensed Clinical Social Worker | Admitting: Licensed Clinical Social Worker

## 2022-06-24 DIAGNOSIS — F4310 Post-traumatic stress disorder, unspecified: Secondary | ICD-10-CM | POA: Diagnosis not present

## 2022-06-24 DIAGNOSIS — F419 Anxiety disorder, unspecified: Secondary | ICD-10-CM | POA: Diagnosis not present

## 2022-06-25 DIAGNOSIS — F4310 Post-traumatic stress disorder, unspecified: Secondary | ICD-10-CM | POA: Insufficient documentation
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
## 2022-07-07 ENCOUNTER — Other Ambulatory Visit: Payer: Self-pay | Admitting: Family Medicine

## 2022-07-07 DIAGNOSIS — B3731 Acute candidiasis of vulva and vagina: Secondary | ICD-10-CM

## 2022-07-08 ENCOUNTER — Other Ambulatory Visit (HOSPITAL_COMMUNITY): Payer: Self-pay

## 2022-07-08 ENCOUNTER — Other Ambulatory Visit: Payer: Self-pay

## 2022-07-08 ENCOUNTER — Encounter: Payer: Self-pay | Admitting: Pharmacist

## 2022-07-08 ENCOUNTER — Ambulatory Visit (HOSPITAL_BASED_OUTPATIENT_CLINIC_OR_DEPARTMENT_OTHER): Payer: PRIVATE HEALTH INSURANCE | Admitting: Licensed Clinical Social Worker

## 2022-07-08 ENCOUNTER — Other Ambulatory Visit: Payer: Self-pay | Admitting: Family Medicine

## 2022-07-08 DIAGNOSIS — B3731 Acute candidiasis of vulva and vagina: Secondary | ICD-10-CM

## 2022-07-08 DIAGNOSIS — F4310 Post-traumatic stress disorder, unspecified: Secondary | ICD-10-CM | POA: Diagnosis not present

## 2022-07-08 DIAGNOSIS — F419 Anxiety disorder, unspecified: Secondary | ICD-10-CM | POA: Diagnosis not present

## 2022-07-08 MED ORDER — FLUCONAZOLE 150 MG PO TABS
150.0000 mg | ORAL_TABLET | Freq: Every day | ORAL | 0 refills | Status: DC | PRN
Start: 2022-07-08 — End: 2022-07-15
  Filled 2022-07-08: qty 15, 15d supply, fill #0

## 2022-07-08 NOTE — Telephone Encounter (Signed)
Requested medication (s) are due for refill today: no  Requested medication (s) are on the active medication list: yes    Last refill: 07/08/22  #15  0 refills  Future visit scheduled 09/23/22  Notes to clinic: Sent to wrong pharmacy. Off protocol, cannot resend.  Please resend to :    o be filled at: [facility] - [place]   Requested Prescriptions  Pending Prescriptions Disp Refills   fluconazole (DIFLUCAN) 150 MG tablet 15 tablet 0    Sig: Take 1 tablet (150 mg total) by mouth daily as needed.     Off-Protocol Failed - 07/08/2022 12:06 PM      Failed - Medication not assigned to a protocol, review manually.      Passed - Valid encounter within last 12 months    Recent Outpatient Visits           3 weeks ago Type 2 diabetes mellitus with hyperglycemia, without long-term current use of insulin (HCC)   [place] [facility] [place], [place], MD   3 months ago Type 2 diabetes mellitus with hyperglycemia, without long-term current use of insulin (HCC)   [place] [facility] [place], [place], MD   6 months ago Type 2 diabetes mellitus with hyperglycemia, without long-term current use of insulin (HCC)   [place] [facility] [place], [place], MD   10 months ago Type 2 diabetes mellitus with hyperglycemia, without long-term current use of insulin [facility])   [place] [facility] [place], Odette Horns, MD   1 year ago Type 2 diabetes mellitus with hyperglycemia, without long-term current use of insulin [facility])   [place] [facility] Hoy Register, MD       Future Appointments             In 2 months Hoy Register, MD [facility]

## 2022-07-08 NOTE — Telephone Encounter (Signed)
Medication Refill - Medication: fluconazole (DIFLUCAN) 150 MG tablet   Has the patient contacted their pharmacy? No.   Preferred Pharmacy (with phone number or street name):  [facility] - [place], [place] [facility] [place] Phone: [phone]  Fax: [phone]      Has the patient been seen for an appointment in the last year OR does the patient have an upcoming appointment? Yes.    The patient requested this yesterday through her my chart but it did not give her the choice of picking her actual pharmacy she uses but instead by default chose the [employer] at [facility] which she does not use. Please assist patient further
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
## 2022-07-15 ENCOUNTER — Other Ambulatory Visit: Payer: Self-pay

## 2022-07-15 DIAGNOSIS — B3731 Acute candidiasis of vulva and vagina: Secondary | ICD-10-CM

## 2022-07-15 MED ORDER — FLUCONAZOLE 150 MG PO TABS
150.0000 mg | ORAL_TABLET | Freq: Every day | ORAL | 0 refills | Status: DC | PRN
Start: 2022-07-15 — End: 2022-12-30

## 2022-07-15 NOTE — Telephone Encounter (Signed)
Resending to correct pharmacy since pt is [employer] employee and cannot get from [facility].   Requested Prescriptions  Pending Prescriptions Disp Refills   fluconazole (DIFLUCAN) 150 MG tablet 15 tablet 0    Sig: Take 1 tablet (150 mg total) by mouth daily as needed.     Off-Protocol Failed - 07/15/2022 10:47 AM      Failed - Medication not assigned to a protocol, review manually.      Passed - Valid encounter within last 12 months    Recent Outpatient Visits           4 weeks ago Type 2 diabetes mellitus with hyperglycemia, without long-term current use of insulin (HCC)   [place] [facility] [place], [place], MD   3 months ago Type 2 diabetes mellitus with hyperglycemia, without long-term current use of insulin (HCC)   [place] [facility] Guadalupe Guerra, [place], MD   7 months ago Type 2 diabetes mellitus with hyperglycemia, without long-term current use of insulin (HCC)   [place] [facility] [place], [place], MD   10 months ago Type 2 diabetes mellitus with hyperglycemia, without long-term current use of insulin [facility])   [place] [facility] [place], Odette Horns, MD   1 year ago Type 2 diabetes mellitus with hyperglycemia, without long-term current use of insulin [facility])   [place] [facility] Hoy Register, MD       Future Appointments             In 2 months Hoy Register, MD [facility]

## 2022-07-20 ENCOUNTER — Telehealth: Payer: Self-pay | Admitting: Family Medicine

## 2022-07-20 NOTE — Telephone Encounter (Signed)
Copied from CRM [id number]. Topic: General - Other >> Jul 20, 2022 12:43 PM Everette C wrote: Reason for CRM: The patient would ike to be contacted to reschedule their BH appt with CMargo Aye   The patient would like to be seen on 08/05/22  Please contact further when possible

## 2022-07-22 ENCOUNTER — Ambulatory Visit: Payer: Self-pay | Admitting: Licensed Clinical Social Worker

## 2022-08-03 ENCOUNTER — Telehealth: Payer: Self-pay | Admitting: Licensed Clinical Social Worker

## 2022-08-03 NOTE — Telephone Encounter (Signed)
Called pt to schedule an appointment for the 26 of June. Left pt voicemail.

## 2022-08-05 ENCOUNTER — Ambulatory Visit: Payer: PRIVATE HEALTH INSURANCE | Admitting: Licensed Clinical Social Worker

## 2022-09-14 ENCOUNTER — Other Ambulatory Visit: Payer: Self-pay | Admitting: Family Medicine

## 2022-09-14 DIAGNOSIS — E1165 Type 2 diabetes mellitus with hyperglycemia: Secondary | ICD-10-CM

## 2022-09-14 NOTE — Telephone Encounter (Signed)
Medication Refill - Medication: glipiZIDE (GLUCOTROL) 10 MG tablet   Has the patient contacted their pharmacy? Yes.   Pharmacy sent several request for refill with no response  Preferred Pharmacy (with phone number or street name):  [facility] - [place], [place] [facility] Phone: [phone]  Fax: [phone]     Has the patient been seen for an appointment in the last year OR does the patient have an upcoming appointment? Yes.    Agent: Please be advised that RX refills may take up to 3 business days. We ask that you follow-up with your pharmacy.  Please f/u with patient as she was not aware the medication was discontinued

## 2022-09-15 MED ORDER — GLIPIZIDE 10 MG PO TABS
20.0000 mg | ORAL_TABLET | Freq: Two times a day (BID) | ORAL | 0 refills | Status: DC
Start: 2022-09-15 — End: 2022-12-30

## 2022-09-15 NOTE — Telephone Encounter (Signed)
Requested Prescriptions  Pending Prescriptions Disp Refills   glipiZIDE (GLUCOTROL) 10 MG tablet 360 tablet 0    Sig: Take 2 tablets (20 mg total) by mouth 2 (two) times daily before a meal.     Endocrinology:  Diabetes - Sulfonylureas Failed - 09/14/2022  9:52 AM      Failed - HBA1C is between 0 and 7.9 and within 180 days    HbA1c, POC (controlled diabetic range)  Date Value Ref Range Status  06/17/2022 9.8 (A) 0.0 - 7.0 % Final         Passed - Cr in normal range and within 360 days    Creat  Date Value Ref Range Status  10/07/2016 0.78 0.50 - 1.10 mg/dL Final   Creatinine, Ser  Date Value Ref Range Status  03/18/2022 0.71 0.57 - 1.00 mg/dL Final         Passed - Valid encounter within last 6 months    Recent Outpatient Visits           3 months ago Type 2 diabetes mellitus with hyperglycemia, without long-term current use of insulin (HCC)   [place] [facility] [place], [place], MD   6 months ago Type 2 diabetes mellitus with hyperglycemia, without long-term current use of insulin (HCC)   [place] [facility] [place], [place], MD   9 months ago Type 2 diabetes mellitus with hyperglycemia, without long-term current use of insulin (HCC)   [employer] [facility] [place], [place], MD   1 year ago Type 2 diabetes mellitus with hyperglycemia, without long-term current use of insulin (HCC)   [place] [facility] [place], [place], MD   1 year ago Type 2 diabetes mellitus with hyperglycemia, without long-term current use of insulin [facility])    [place] [facility] Hoy Register, MD       Future Appointments             In 1 week Hoy Register, MD [facility]

## 2022-09-17 ENCOUNTER — Other Ambulatory Visit: Payer: Self-pay | Admitting: Family Medicine

## 2022-09-17 DIAGNOSIS — F419 Anxiety disorder, unspecified: Secondary | ICD-10-CM

## 2022-09-17 NOTE — Telephone Encounter (Signed)
Medication Refill - Medication: busPIRone (BUSPAR) 15 MG tablet [[id number]]  Has the patient contacted their pharmacy? Yes.   ( Preferred Pharmacy (with phone number or street name): [facility] - [place], [place] [facility] [place] [facility] [address] [place] [address]  Phone:  [phone]  Fax:  [phone]    Has the patient been seen for an appointment in the last year OR does the patient have an upcoming appointment? Yes.    Agent: Please be advised that RX refills may take up to 3 business days. We ask that you follow-up with your pharmacy.

## 2022-09-18 MED ORDER — BUSPIRONE HCL 15 MG PO TABS
15.0000 mg | ORAL_TABLET | Freq: Two times a day (BID) | ORAL | 1 refills | Status: DC
Start: 2022-09-18 — End: 2022-12-30

## 2022-09-18 NOTE — Telephone Encounter (Signed)
Requested Prescriptions  Pending Prescriptions Disp Refills   busPIRone (BUSPAR) 15 MG tablet 180 tablet 1    Sig: Take 1 tablet (15 mg total) by mouth 2 (two) times daily.     Psychiatry: Anxiolytics/Hypnotics - Non-controlled Passed - 09/17/2022  3:18 PM      Passed - Valid encounter within last 12 months    Recent Outpatient Visits           3 months ago Type 2 diabetes mellitus with hyperglycemia, without long-term current use of insulin (HCC)   [place] [facility] [place], [place], MD   6 months ago Type 2 diabetes mellitus with hyperglycemia, without long-term current use of insulin (HCC)   [place] [facility] [place], [place], MD   9 months ago Type 2 diabetes mellitus with hyperglycemia, without long-term current use of insulin (HCC)   [place] [facility] [place], [place], MD   1 year ago Type 2 diabetes mellitus with hyperglycemia, without long-term current use of insulin [facility])   [place] [facility] [place], Odette Horns, MD   1 year ago Type 2 diabetes mellitus with hyperglycemia, without long-term current use of insulin [facility])   [place] [facility] Hoy Register, MD       Future Appointments             In 5 days Hoy Register, MD [facility]

## 2022-09-23 ENCOUNTER — Ambulatory Visit: Payer: PRIVATE HEALTH INSURANCE | Attending: Family Medicine | Admitting: Family Medicine

## 2022-09-23 ENCOUNTER — Encounter: Payer: Self-pay | Admitting: Family Medicine

## 2022-09-23 VITALS — BP 155/99 | HR 102 | Ht 67.0 in | Wt 277.8 lb

## 2022-09-23 DIAGNOSIS — E1169 Type 2 diabetes mellitus with other specified complication: Secondary | ICD-10-CM

## 2022-09-23 DIAGNOSIS — Z7984 Long term (current) use of oral hypoglycemic drugs: Secondary | ICD-10-CM

## 2022-09-23 DIAGNOSIS — Z794 Long term (current) use of insulin: Secondary | ICD-10-CM

## 2022-09-23 DIAGNOSIS — E1159 Type 2 diabetes mellitus with other circulatory complications: Secondary | ICD-10-CM | POA: Diagnosis not present

## 2022-09-23 DIAGNOSIS — F419 Anxiety disorder, unspecified: Secondary | ICD-10-CM | POA: Diagnosis not present

## 2022-09-23 DIAGNOSIS — E1165 Type 2 diabetes mellitus with hyperglycemia: Secondary | ICD-10-CM | POA: Diagnosis not present

## 2022-09-23 DIAGNOSIS — I152 Hypertension secondary to endocrine disorders: Secondary | ICD-10-CM

## 2022-09-23 DIAGNOSIS — E785 Hyperlipidemia, unspecified: Secondary | ICD-10-CM

## 2022-09-23 DIAGNOSIS — Z7985 Long-term (current) use of injectable non-insulin antidiabetic drugs: Secondary | ICD-10-CM

## 2022-09-23 LAB — POCT GLYCOSYLATED HEMOGLOBIN (HGB A1C): HbA1c, POC (controlled diabetic range): 8.2 % — AB (ref 0.0–7.0)

## 2022-09-23 MED ORDER — AMLODIPINE BESYLATE 5 MG PO TABS
5.0000 mg | ORAL_TABLET | Freq: Every day | ORAL | 1 refills | Status: DC
Start: 2022-09-23 — End: 2022-12-30

## 2022-09-23 MED ORDER — VALSARTAN 320 MG PO TABS
320.0000 mg | ORAL_TABLET | Freq: Every day | ORAL | 1 refills | Status: DC
Start: 2022-09-23 — End: 2022-12-30

## 2022-09-23 MED ORDER — LANTUS SOLOSTAR 100 UNIT/ML ~~LOC~~ SOPN: 62.0000 [IU] | PEN_INJECTOR | Freq: Every day | SUBCUTANEOUS | 6 refills | Status: DC

## 2022-09-23 MED ORDER — FLUOXETINE HCL 20 MG PO CAPS
20.0000 mg | ORAL_CAPSULE | Freq: Every day | ORAL | 1 refills | Status: DC
Start: 2022-09-23 — End: 2022-12-16

## 2022-09-23 MED ORDER — INSULIN LISPRO (1 UNIT DIAL) 100 UNIT/ML (KWIKPEN)
0.0000 [IU] | PEN_INJECTOR | Freq: Three times a day (TID) | SUBCUTANEOUS | 11 refills | Status: DC
Start: 2022-09-23 — End: 2022-12-30

## 2022-09-23 MED ORDER — TIRZEPATIDE 15 MG/0.5ML ~~LOC~~ SOAJ
15.0000 mg | SUBCUTANEOUS | 6 refills | Status: DC
Start: 2022-09-23 — End: 2022-12-30

## 2022-09-23 NOTE — Progress Notes (Signed)
Subjective:  Patient ID: Lynn Anderson, female    DOB: 15-Dec-1980  Age: 42 y.o. MRN: [id number]  CC: Medical Management of Chronic Issues   HPI Lynn Anderson is a 42 y.o. year old female with a history of Type 2 DM  (A1c 9.8). Hyperlipidemia, Hypertension, and obesity who comes in today for follow up for her chronic medical conditions.    Interval History: Discussed the use of AI scribe software for clinical note transcription with the patient, who gave verbal consent to proceed.  She reports inconsistent access to their prescribed Mounjaro, sometimes having to substitute with Ozempic. Despite this, her HbA1c has improved from 9.8 to 8.2. She is also on Lantus (62 units nightly), Humalog (sliding scale), and Glipizide. She reports occasional hypoglycemia, usually when she skips meals after work due to fatigue and goes straight to sleep.  The patient has been using a continuous glucose monitor (CGM), but finds it inconvenient and inaccurate, preferring to prick her finger for glucose readings, which usually range from 160 to 180.  Her blood pressure is slightly high, despite a recent switch from Losartan to Valsartan.  The patient's anxiety remains unchanged, despite attempts to engage with a Child psychotherapist and reports success with counseling.  She is on BuSpar.    Past Medical History:  Diagnosis Date   Diabetes mellitus without complication (HCC)    Hyperlipidemia    Hypertension     Past Surgical History:  Procedure Laterality Date   CHOLECYSTECTOMY     Mirena      Inserted 10-16-14    Family History  Problem Relation Age of Onset   Hyperlipidemia Mother    Hypertension Mother    Diabetes Mother    Diabetes Maternal Grandmother    Heart disease Father    Diabetes Maternal Aunt     Social History   Socioeconomic History   Marital status: Single    Spouse name: Not on file   Number of children: Not on file   Years of education: Not on file   Highest  education level: Not on file  Occupational History   Not on file  Tobacco Use   Smoking status: Never   Smokeless tobacco: Never  Vaping Use   Vaping status: Never Used  Substance and Sexual Activity   Alcohol use: No    Alcohol/week: 0.0 standard drinks of alcohol   Drug use: No   Sexual activity: Not Currently    Birth control/protection: I.U.D.    Comment: Mirena  inserted 10-16-14-INTERCOURSE AGE 78,MORE THAN 5  SEXUAL PARTNERS  Other Topics Concern   Not on file  Social History Narrative   Not on file   Social Determinants of Health   Financial Resource Strain: Not on file  Food Insecurity: Not on file  Transportation Needs: Not on file  Physical Activity: Not on file  Stress: Not on file  Social Connections: Not on file    Allergies  Allergen Reactions   Metformin And Related Diarrhea    Outpatient Medications Prior to Visit  Medication Sig Dispense Refill   atorvastatin (LIPITOR) 80 MG tablet Take 1 tablet (80 mg total) by mouth daily. 90 tablet 1   Blood Glucose Monitoring Suppl (ONE TOUCH ULTRA 2) w/Device KIT 1 each by Does not apply route 3 (three) times daily. 1 each 0   busPIRone (BUSPAR) 15 MG tablet Take 1 tablet (15 mg total) by mouth 2 (two) times daily. 180 tablet 1   Continuous Blood Gluc  Receiver (FREESTYLE LIBRE 2 READER) DEVI Use to check blood sugar three times daily. E11.65 1 each 0   Continuous Blood Gluc Sensor (FREESTYLE LIBRE 2 SENSOR) MISC Use to check blood sugar three times daily. Change sensors once every 2 weeks. E11.65 2 each 3   glipiZIDE (GLUCOTROL) 10 MG tablet Take 2 tablets (20 mg total) by mouth 2 (two) times daily before a meal. 360 tablet 0   glucose blood (ONETOUCH VERIO) test strip Use to test blood sugar 2 times daily as instructed 100 each 2   ibuprofen (ADVIL) 800 MG tablet Take 1 tablet (800 mg total) by mouth 3 (three) times daily. 21 tablet 0   Insulin Pen Needle (BD PEN NEEDLE NANO U/F) 32G X 4 MM MISC Use as directed once  daily to administer Victoza & Lantus 100 each 3   Lancets (ONETOUCH ULTRASOFT) lancets Use as instructed 100 each 12   levonorgestrel (MIRENA) 20 MCG/24HR IUD 1 each by Intrauterine route once.     ONETOUCH DELICA LANCETS FINE MISC Use to test blood sugar 2 times daily as instructed. 100 each 2   terconazole (TERAZOL 3) 0.8 % vaginal cream Place 1 applicator vaginally at bedtime. 20 g 1   insulin glargine (LANTUS SOLOSTAR) 100 UNIT/ML Solostar Pen Inject 62 Units into the skin daily. 30 mL 6   insulin lispro (HUMALOG KWIKPEN) 100 UNIT/ML KwikPen Inject 0-12 Units into the skin 3 (three) times daily. Per sliding scale 15 mL 11   tirzepatide (MOUNJARO) 15 MG/0.5ML Pen Inject 15 mg into the skin once a week. 6 mL 6   valsartan (DIOVAN) 320 MG tablet Take 1 tablet (320 mg total) by mouth daily. 90 tablet 1   fluconazole (DIFLUCAN) 150 MG tablet Take 1 tablet (150 mg total) by mouth daily as needed. (Patient not taking: Reported on 09/23/2022) 15 tablet 0   No facility-administered medications prior to visit.     ROS Review of Systems  Constitutional:  Negative for activity change and appetite change.  HENT:  Negative for sinus pressure and sore throat.   Respiratory:  Negative for chest tightness, shortness of breath and wheezing.   Cardiovascular:  Negative for chest pain and palpitations.  Gastrointestinal:  Negative for abdominal distention, abdominal pain and constipation.  Genitourinary: Negative.   Musculoskeletal: Negative.   Psychiatric/Behavioral:  Negative for behavioral problems and dysphoric mood.     Objective:  BP (!) 155/99   Pulse (!) 102   Ht 5\' 7"  (1.702 m)   Wt 277 lb 12.8 oz (126 kg)   SpO2 99%   BMI 43.51 kg/m      09/23/2022    4:27 PM 09/23/2022    3:36 PM 06/17/2022    4:09 PM  BP/Weight  Systolic BP 155 137 145  Diastolic BP 99 93 91  Wt. (Lbs)  277.8   BMI  43.51 kg/m2     Wt Readings from Last 3 Encounters:  09/23/22 277 lb 12.8 oz (126 kg)   06/17/22 281 lb (127.5 kg)  03/18/22 288 lb (130.6 kg)      Physical Exam Constitutional:      Appearance: She is well-developed.  Cardiovascular:     Rate and Rhythm: Tachycardia present.     Heart sounds: Normal heart sounds. No murmur heard. Pulmonary:     Effort: Pulmonary effort is normal.     Breath sounds: Normal breath sounds. No wheezing or rales.  Chest:     Chest wall: No tenderness.  Abdominal:  General: Bowel sounds are normal. There is no distension.     Palpations: Abdomen is soft. There is no mass.     Tenderness: There is no abdominal tenderness.  Musculoskeletal:        General: Normal range of motion.     Right lower leg: No edema.     Left lower leg: No edema.  Neurological:     Mental Status: She is alert and oriented to person, place, and time.  Psychiatric:        Mood and Affect: Mood normal.    Diabetic Foot Exam - Simple   Simple Foot Form Diabetic Foot exam was performed with the following findings: Yes 09/23/2022  4:08 PM  Visual Inspection No deformities, no ulcerations, no other skin breakdown bilaterally: Yes Sensation Testing Intact to touch and monofilament testing bilaterally: Yes Pulse Check Posterior Tibialis and Dorsalis pulse intact bilaterally: Yes Comments        Latest Ref Rng & Units 06/17/2022    4:18 PM 03/18/2022    3:05 PM 12/17/2021    2:48 PM  CMP  Glucose 70 - 99 mg/dL  87  96   BUN 6 - 24 mg/dL  7  7   Creatinine 1.61 - 1.00 mg/dL  0.96  0.45   Sodium 409 - 144 mmol/L  138  141   Potassium 3.5 - 5.2 mmol/L 4.4  3.2  4.6   Chloride 96 - 106 mmol/L  101  104   CO2 20 - 29 mmol/L  20  21   Calcium 8.7 - 10.2 mg/dL  8.8  9.1   Total Protein 6.0 - 8.5 g/dL  6.8  6.4   Total Bilirubin 0.0 - 1.2 mg/dL  0.4  0.4   Alkaline Phos 44 - 121 IU/L  67  67   AST 0 - 40 IU/L  11  12   ALT 0 - 32 IU/L  16  15     Lipid Panel     Component Value Date/Time   CHOL 151 03/18/2022 1505   TRIG 132 03/18/2022 1505   HDL 47  03/18/2022 1505   CHOLHDL 4.4 06/05/2020 1058   CHOLHDL 5.4 (H) 10/07/2016 1131   VLDL 55 (H) 10/07/2016 1131   LDLCALC 81 03/18/2022 1505    CBC    Component Value Date/Time   WBC 8.4 07/21/2017 1230   RBC 5.45 (H) 07/21/2017 1230   HGB 13.8 07/21/2017 1230   HCT 42.7 07/21/2017 1230   PLT 238 07/21/2017 1230   MCV 78.3 07/21/2017 1230   MCH 25.3 (L) 07/21/2017 1230   MCHC 32.3 07/21/2017 1230   RDW 12.6 07/21/2017 1230   LYMPHSABS 2,592 10/07/2016 1131   MONOABS 672 10/07/2016 1131   EOSABS 96 10/07/2016 1131   BASOSABS 0 10/07/2016 1131    Lab Results  Component Value Date   HGBA1C 8.2 (A) 09/23/2022    Assessment & Plan:      Type 2 Diabetes Mellitus Improved HbA1c (8.2 from 9.7), but still above target. Inconsistent access to [facility]. Patient reports occasional hypoglycemia, particularly when skipping meals after work. -Continue Mounjaro 15mg , Lantus 62 units nightly, Humalog sliding scale, and Glipizide as prescribed. -Encourage consistent use of Mounjaro when available. -Advise patient to increase Lantus to 64 units if blood glucose consistently above 180. -Recommend carrying a snack for post-work to prevent hypoglycemia.  Hypertension Slightly elevated blood pressure, but improved from last visit. Recently switched from Losartan to Valsartan. -Continue Valsartan as prescribed. -  Recheck blood pressure during this visit-blood pressure still elevated Add on amlodipine 5 mg -Counseled on blood pressure goal of less than 130/80, low-sodium, DASH diet, medication compliance, 150 minutes of moderate intensity exercise per week. Discussed medication compliance, adverse effects.   Anxiety Persistent symptoms despite previous interventions. Patient declined Child psychotherapist intervention. -Initiate Prozac for management of anxiety symptoms. -We have discussed benefits and adverse effects of SSRIs  General Health Maintenance -Order liver function tests. -Encourage  regular eye exams and foot care due to diabetes. -Follow-up visit to monitor response to Prozac and ongoing management of diabetes and hypertension.          Meds ordered this encounter  Medications   FLUoxetine (PROZAC) 20 MG capsule    Sig: Take 1 capsule (20 mg total) by mouth daily.    Dispense:  90 capsule    Refill:  1   insulin glargine (LANTUS SOLOSTAR) 100 UNIT/ML Solostar Pen    Sig: Inject 62 Units into the skin daily. Titrate by 2 units every fourth day until blood sugars are at goal to a maximum of 70 units daily    Dispense:  30 mL    Refill:  6    Dose increase   insulin lispro (HUMALOG KWIKPEN) 100 UNIT/ML KwikPen    Sig: Inject 0-12 Units into the skin 3 (three) times daily. Per sliding scale    Dispense:  15 mL    Refill:  11    For blood sugars 0-150 give 0 units of insulin, 151-200 give 2 units of insulin, 201-250 give 4 units, 251-300 give 6 units, 301-350 give 8 units, 351-400 give 10 units,> 400 give 12 units   tirzepatide (MOUNJARO) 15 MG/0.5ML Pen    Sig: Inject 15 mg into the skin once a week.    Dispense:  6 mL    Refill:  6   valsartan (DIOVAN) 320 MG tablet    Sig: Take 1 tablet (320 mg total) by mouth daily.    Dispense:  90 tablet    Refill:  1    Discontinue Losartan   amLODipine (NORVASC) 5 MG tablet    Sig: Take 1 tablet (5 mg total) by mouth daily.    Dispense:  90 tablet    Refill:  1    Follow-up: Return in about 3 months (around 12/24/2022) for Chronic medical conditions.       Hoy Register, MD, FAAFP. [facility] [place], [place] [phone]   09/23/2022, 6:16 PM

## 2022-09-23 NOTE — Patient Instructions (Signed)
Managing Anxiety, Adult After being diagnosed with anxiety, you may be relieved to know why you have felt or behaved a certain way. You may also feel overwhelmed about the treatment ahead and what it will mean for your life. With care and support, you can manage your anxiety. How to manage lifestyle changes Understanding the difference between stress and anxiety Although stress can play a role in anxiety, it is not the same as anxiety. Stress is your body's reaction to life changes and events, both good and bad. Stress is often caused by something external, such as a deadline, test, or competition. It normally goes away after the event has ended and will last just a few hours. But, stress can be ongoing and can lead to more than just stress. Anxiety is caused by something internal, such as imagining a terrible outcome or worrying that something will go wrong that will greatly upset you. Anxiety often does not go away even after the event is over, and it can become a long-term (chronic) worry. Lowering stress and anxiety Talk with your health care provider or a counselor to learn more about lowering anxiety and stress. They may suggest tension-reduction techniques, such as: Music. Spend time creating or listening to music that you enjoy and that inspires you. Mindfulness-based meditation. Practice being aware of your normal breaths while not trying to control your breathing. It can be done while sitting or walking. Centering prayer. Focus on a word, phrase, or sacred image that means something to you and brings you peace. Deep breathing. Expand your stomach and inhale slowly through your nose. Hold your breath for 3-5 seconds. Then breathe out slowly, letting your stomach muscles relax. Self-talk. Learn to notice and spot thought patterns that lead to anxiety reactions. Change those patterns to thoughts that feel peaceful. Muscle relaxation. Take time to tense muscles and then relax them. Choose a  tension-reduction technique that fits your lifestyle and personality. These techniques take time and practice. Set aside 5-15 minutes a day to do them. Specialized therapists can offer counseling and training in these techniques. The training to help with anxiety may be covered by some insurance plans. Other things you can do to manage stress and anxiety include: Keeping a stress diary. This can help you learn what triggers your reaction and then learn ways to manage your response. Thinking about how you react to certain situations. You may not be able to control everything, but you can control your response. Making time for activities that help you relax and not feeling guilty about spending your time in this way. Doing visual imagery. This involves imagining or creating mental pictures to help you relax. Practicing yoga. Through yoga poses, you can lower tension and relax.  Medicines Medicines for anxiety include: Antidepressant medicines. These are usually prescribed for long-term daily control. Anti-anxiety medicines. These may be added in severe cases, especially when panic attacks occur. When used together, medicines, psychotherapy, and tension-reduction techniques may be the most effective treatment. Relationships Relationships can play a big part in helping you recover. Spend more time connecting with trusted friends and family members. Think about going to couples counseling if you have a partner, taking family education classes, or going to family therapy. Therapy can help you and others better understand your anxiety. How to recognize changes in your anxiety Everyone responds differently to treatment for anxiety. Recovery from anxiety happens when symptoms lessen and stop interfering with your daily life at home or work. This may mean that you   will start to: Have better concentration and focus. Worry will interfere less in your daily thinking. Sleep better. Be less irritable. Have more  energy. Have improved memory. Try to recognize when your condition is getting worse. Contact your provider if your symptoms interfere with home or work and you feel like your condition is not improving. Follow these instructions at home: Activity Exercise. Adults should: Exercise for at least 150 minutes each week. The exercise should increase your heart rate and make you sweat (moderate-intensity exercise). Do strengthening exercises at least twice a week. Get the right amount and quality of sleep. Most adults need 7-9 hours of sleep each night. Lifestyle  Eat a healthy diet that includes plenty of vegetables, fruits, whole grains, low-fat dairy products, and lean protein. Do not eat a lot of foods that are high in fats, added sugars, or salt (sodium). Make choices that simplify your life. Do not use any products that contain nicotine or tobacco. These products include cigarettes, chewing tobacco, and vaping devices, such as e-cigarettes. If you need help quitting, ask your provider. Avoid caffeine, alcohol, and certain over-the-counter cold medicines. These may make you feel worse. Ask your pharmacist which medicines to avoid. General instructions Take over-the-counter and prescription medicines only as told by your provider. Keep all follow-up visits. This is to make sure you are managing your anxiety well or if you need more support. Where to find support You can get help and support from: Self-help groups. Online and community organizations. A trusted spiritual leader. Couples counseling. Family education classes. Family therapy. Where to find more information You may find that joining a support group helps you deal with your anxiety. The following sources can help you find counselors or support groups near you: Mental Health America: mentalhealthamerica.net Anxiety and Depression Association of America (ADAA): adaa.org National Alliance on Mental Illness (NAMI): nami.org Contact  a health care provider if: You have a hard time staying focused or finishing tasks. You spend many hours a day feeling worried about everyday life. You are very tired because you cannot stop worrying. You start to have headaches or often feel tense. You have chronic nausea or diarrhea. Get help right away if: Your heart feels like it is racing. You have shortness of breath. You have thoughts of hurting yourself or others. Get help right away if you feel like you may hurt yourself or others, or have thoughts about taking your own life. Go to your nearest emergency room or: Call 911. Call the National Suicide Prevention Lifeline at 1-800-273-8255 or 988. This is open 24 hours a day. Text the Crisis Text Line at 741741. This information is not intended to replace advice given to you by your health care provider. Make sure you discuss any questions you have with your health care provider. Document Revised: 11/04/2021 Document Reviewed: 05/19/2020 Elsevier Patient Education  2024 Elsevier Inc.  

## 2022-09-24 LAB — CMP14+EGFR
ALT: 19 IU/L (ref 0–32)
AST: 13 IU/L (ref 0–40)
Albumin: 4.3 g/dL (ref 3.9–4.9)
Alkaline Phosphatase: 66 IU/L (ref 44–121)
BUN/Creatinine Ratio: 12 (ref 9–23)
BUN: 9 mg/dL (ref 6–24)
Bilirubin Total: 0.6 mg/dL (ref 0.0–1.2)
CO2: 22 mmol/L (ref 20–29)
Calcium: 9.5 mg/dL (ref 8.7–10.2)
Chloride: 106 mmol/L (ref 96–106)
Creatinine, Ser: 0.77 mg/dL (ref 0.57–1.00)
Globulin, Total: 2.8 g/dL (ref 1.5–4.5)
Glucose: 94 mg/dL (ref 70–99)
Potassium: 3.8 mmol/L (ref 3.5–5.2)
Sodium: 142 mmol/L (ref 134–144)
Total Protein: 7.1 g/dL (ref 6.0–8.5)
eGFR: 97 mL/min/{1.73_m2} (ref >59)

## 2022-10-14 ENCOUNTER — Other Ambulatory Visit: Payer: Self-pay | Admitting: Family Medicine

## 2022-10-14 ENCOUNTER — Telehealth: Payer: Self-pay | Admitting: Family Medicine

## 2022-10-14 DIAGNOSIS — E1169 Type 2 diabetes mellitus with other specified complication: Secondary | ICD-10-CM

## 2022-10-14 MED ORDER — ATORVASTATIN CALCIUM 80 MG PO TABS
80.0000 mg | ORAL_TABLET | Freq: Every day | ORAL | 1 refills | Status: DC
Start: 2022-10-14 — End: 2022-12-30

## 2022-10-14 NOTE — Telephone Encounter (Signed)
Reached to patient to inquire which medications are needed. Refills has been sent to patient's pharmacy.

## 2022-10-14 NOTE — Telephone Encounter (Signed)
Copied from CRM [id number]. Topic: General - Inquiry >> Oct 14, 2022  1:53 PM De Blanch wrote: Reason for CRM: Pt wants to know why she cannot ask for refills on MyChart for her medication.  She stated that medication has a lock on it and cannot request refills and her pharmacy is having a hard time reaching office to request refills.   Please advise.

## 2022-10-14 NOTE — Telephone Encounter (Signed)
Medication Refill - Medication: atorvastatin (LIPITOR) 80 MG tablet   Has the patient contacted their pharmacy? Yes.    (Agent: If yes, when and what did the pharmacy advise?)  Preferred Pharmacy (with phone number or street name):  [facility] - [place], [facility] [place] [address]  Phone: [phone] Fax: [phone]  Hours: Not open 24 hours   Has the patient been seen for an appointment in the last year OR does the patient have an upcoming appointment? Yes.    Agent: Please be advised that RX refills may take up to 3 business days. We ask that you follow-up with your pharmacy.

## 2022-12-16 ENCOUNTER — Other Ambulatory Visit: Payer: Self-pay | Admitting: Family Medicine

## 2022-12-16 DIAGNOSIS — F419 Anxiety disorder, unspecified: Secondary | ICD-10-CM

## 2022-12-16 DIAGNOSIS — E1165 Type 2 diabetes mellitus with hyperglycemia: Secondary | ICD-10-CM

## 2022-12-16 NOTE — Telephone Encounter (Signed)
Medication Refill - Most Recent Primary Care Visit:  Provider: Hoy Register  Department: [facility]  Visit Type: OFFICE VISIT  Date: 09/23/2022  Medication: tirzepatide (MOUNJARO) 15 MG/0.5ML Pen , FLUoxetine (PROZAC) 20 MG capsule   Has the patient contacted their pharmacy? No The patient was using a different pharmacy as her insurance was different. Since her insurance just changed she needs to use a new pharmacy  Has the prescription been filled recently? Yes back in August last  Is the patient out of the medication? Yes back in August last  Has the patient been seen for an appointment in the last year OR does the patient have an upcoming appointment? Yes  Can we respond through MyChart? Yes  The patient uses  [id number] - [place], [place] - [address] AT [facility] [address] Phone: [phone]  Fax: [phone]    Please assist patient further

## 2022-12-17 MED ORDER — FLUOXETINE HCL 20 MG PO CAPS
20.0000 mg | ORAL_CAPSULE | Freq: Every day | ORAL | 0 refills | Status: DC
Start: 2022-12-17 — End: 2022-12-30

## 2022-12-17 NOTE — Telephone Encounter (Signed)
Requested Prescriptions  Pending Prescriptions Disp Refills   tirzepatide (MOUNJARO) 15 MG/0.5ML Pen 6 mL 6    Sig: Inject 15 mg into the skin once a week.     Off-Protocol Failed - 12/16/2022  1:42 PM      Failed - Medication not assigned to a protocol, review manually.      Passed - Valid encounter within last 12 months    Recent Outpatient Visits           2 months ago Type 2 diabetes mellitus with hyperglycemia, without long-term current use of insulin (HCC)   [facility] Dept Of [place]. [facility] Hoy Register, MD   6 months ago Type 2 diabetes mellitus with hyperglycemia, without long-term current use of insulin (HCC)   [facility] - A Dept Of [place]. [facility] Hoy Register, MD   9 months ago Type 2 diabetes mellitus with hyperglycemia, without long-term current use of insulin (HCC)   [facility] - A Dept Of [place]. [facility] Hoy Register, MD   1 year ago Type 2 diabetes mellitus with hyperglycemia, without long-term current use of insulin (HCC)   [facility] - A Dept Of [place]. [facility] Hoy Register, MD   1 year ago Type 2 diabetes mellitus with hyperglycemia, without long-term current use of insulin (HCC)   [facility] - A Dept Of [place]. [facility] Hoy Register, MD       Future Appointments             In 1 week Hoy Register, MD [facility] - A Dept Of [place]. [facility]             FLUoxetine (PROZAC) 20 MG capsule 90 capsule     Sig: Take 1 capsule (20 mg total) by mouth daily.     Psychiatry:  Antidepressants - SSRI Passed - 12/16/2022  1:42 PM      Passed - Valid encounter within last 6 months    Recent Outpatient Visits           2 months ago Type 2 diabetes mellitus with hyperglycemia, without long-term current use of insulin (HCC)   [facility] - A Dept Of [place]. [facility] Hoy Register, MD   6 months ago Type 2 diabetes mellitus with hyperglycemia, without long-term current use of insulin (HCC)   [facility] - A Dept Of [place]. [facility] Hoy Register, MD   9 months ago Type 2 diabetes mellitus with hyperglycemia, without long-term current use of insulin (HCC)   [facility] - A Dept Of [place]. [facility] Hoy Register, MD   1 year ago Type 2 diabetes mellitus with hyperglycemia, without long-term current use of insulin (HCC)   [facility] - A Dept Of [place]. [facility] Hoy Register, MD   1 year ago Type 2 diabetes mellitus with hyperglycemia, without long-term current use of insulin (HCC)   [facility] - A Dept Of . [facility] Hoy Register, MD       Future Appointments             In 1 week Hoy Register, MD [facility] [place] -  A Dept Of [place]. [facility]

## 2022-12-30 ENCOUNTER — Ambulatory Visit: Payer: Managed Care, Other (non HMO) | Attending: Family Medicine | Admitting: Family Medicine

## 2022-12-30 ENCOUNTER — Encounter: Payer: Self-pay | Admitting: Family Medicine

## 2022-12-30 VITALS — BP 148/94 | HR 81 | Ht 67.0 in | Wt 275.4 lb

## 2022-12-30 DIAGNOSIS — E1169 Type 2 diabetes mellitus with other specified complication: Secondary | ICD-10-CM | POA: Diagnosis not present

## 2022-12-30 DIAGNOSIS — F419 Anxiety disorder, unspecified: Secondary | ICD-10-CM

## 2022-12-30 DIAGNOSIS — E1165 Type 2 diabetes mellitus with hyperglycemia: Secondary | ICD-10-CM | POA: Diagnosis not present

## 2022-12-30 DIAGNOSIS — Z7984 Long term (current) use of oral hypoglycemic drugs: Secondary | ICD-10-CM

## 2022-12-30 DIAGNOSIS — I152 Hypertension secondary to endocrine disorders: Secondary | ICD-10-CM

## 2022-12-30 DIAGNOSIS — E1159 Type 2 diabetes mellitus with other circulatory complications: Secondary | ICD-10-CM

## 2022-12-30 DIAGNOSIS — E785 Hyperlipidemia, unspecified: Secondary | ICD-10-CM

## 2022-12-30 DIAGNOSIS — B3731 Acute candidiasis of vulva and vagina: Secondary | ICD-10-CM

## 2022-12-30 LAB — POCT GLYCOSYLATED HEMOGLOBIN (HGB A1C): HbA1c, POC (controlled diabetic range): 7.5 % — AB (ref 0.0–7.0)

## 2022-12-30 MED ORDER — LANTUS SOLOSTAR 100 UNIT/ML ~~LOC~~ SOPN: 62.0000 [IU] | PEN_INJECTOR | Freq: Every day | SUBCUTANEOUS | 6 refills | Status: DC

## 2022-12-30 MED ORDER — VALSARTAN 320 MG PO TABS: 320.0000 mg | ORAL_TABLET | Freq: Every day | ORAL | 1 refills | Status: DC

## 2022-12-30 MED ORDER — AMLODIPINE BESYLATE 5 MG PO TABS
5.0000 mg | ORAL_TABLET | Freq: Every day | ORAL | 1 refills | Status: DC
Start: 2022-12-30 — End: 2023-06-30

## 2022-12-30 MED ORDER — GLIPIZIDE 10 MG PO TABS: 20.0000 mg | ORAL_TABLET | Freq: Two times a day (BID) | ORAL | 1 refills | Status: DC

## 2022-12-30 MED ORDER — ATORVASTATIN CALCIUM 80 MG PO TABS
80.0000 mg | ORAL_TABLET | Freq: Every day | ORAL | 1 refills | Status: DC
Start: 2022-12-30 — End: 2023-06-30

## 2022-12-30 MED ORDER — BUSPIRONE HCL 15 MG PO TABS
15.0000 mg | ORAL_TABLET | Freq: Two times a day (BID) | ORAL | 1 refills | Status: DC
Start: 2022-12-30 — End: 2023-06-30

## 2022-12-30 MED ORDER — INSULIN LISPRO (1 UNIT DIAL) 100 UNIT/ML (KWIKPEN): 0.0000 [IU] | PEN_INJECTOR | Freq: Three times a day (TID) | SUBCUTANEOUS | 11 refills | Status: AC

## 2022-12-30 MED ORDER — FLUOXETINE HCL 40 MG PO CAPS
40.0000 mg | ORAL_CAPSULE | Freq: Every day | ORAL | 1 refills | Status: DC
Start: 2022-12-30 — End: 2023-06-30

## 2022-12-30 MED ORDER — TIRZEPATIDE 15 MG/0.5ML ~~LOC~~ SOAJ: 15.0000 mg | SUBCUTANEOUS | 6 refills | Status: DC

## 2022-12-30 MED ORDER — FLUCONAZOLE 150 MG PO TABS: 150.0000 mg | ORAL_TABLET | Freq: Every day | ORAL | 0 refills | Status: DC | PRN

## 2022-12-30 NOTE — Progress Notes (Signed)
Subjective:  Patient ID: Lynn Anderson, female    DOB: Jul 21, 1980  Age: 42 y.o. MRN: [id number]  CC: Medical Management of Chronic Issues (Out of medications due to insurance)   HPI Lynn Anderson is a 42 y.o. year old female with a history of Type 2 DM  (A1c 7.5). Hyperlipidemia, Hypertension, and obesity who comes in today for follow up for her chronic medical conditions.    Interval History: Discussed the use of AI scribe software for clinical note transcription with the patient, who gave verbal consent to proceed.  She presents for a medication refill. She recently changed jobs and consequently, her insurance and pharmacy. She is in the process of transferring her prescriptions from her old pharmacy to [facility]. She has been without all her medications for approximately three days.  The patient's last A1c was 8.2, but it has improved to 7.5. She attributes this improvement to the addition of amlodipine and Prozac to her regimen, which she believes has helped lower her blood sugars.  The patient also reports anxiety and requests an increase in her Prozac dosage. She has previously tried therapy but did not find it beneficial due to the therapist's tardiness and distractions during sessions. She is open to trying therapy with a different provider.     Past Medical History:  Diagnosis Date   Diabetes mellitus without complication (HCC)    Hyperlipidemia    Hypertension     Past Surgical History:  Procedure Laterality Date   CHOLECYSTECTOMY     Mirena      Inserted 10-16-14    Family History  Problem Relation Age of Onset   Hyperlipidemia Mother    Hypertension Mother    Diabetes Mother    Diabetes Maternal Grandmother    Heart disease Father    Diabetes Maternal Aunt     Social History   Socioeconomic History   Marital status: Single    Spouse name: Not on file   Number of children: Not on file   Years of education: Not on file   Highest education level:  Not on file  Occupational History   Not on file  Tobacco Use   Smoking status: Never   Smokeless tobacco: Never  Vaping Use   Vaping status: Never Used  Substance and Sexual Activity   Alcohol use: No    Alcohol/week: 0.0 standard drinks of alcohol   Drug use: No   Sexual activity: Not Currently    Birth control/protection: I.U.D.    Comment: Mirena  inserted 10-16-14-INTERCOURSE AGE 45,MORE THAN 5  SEXUAL PARTNERS  Other Topics Concern   Not on file  Social History Narrative   Not on file   Social Determinants of Health   Financial Resource Strain: High Risk (12/30/2022)   Overall Financial Resource Strain (CARDIA)    Difficulty of Paying Living Expenses: Very hard  Food Insecurity: Food Insecurity Present (12/30/2022)   Hunger Vital Sign    Worried About Running Out of Food in the Last Year: Sometimes true    Ran Out of Food in the Last Year: Sometimes true  Transportation Needs: No Transportation Needs (12/30/2022)   PRAPARE - Administrator, Civil Service (Medical): No    Lack of Transportation (Non-Medical): No  Physical Activity: Insufficiently Active (12/30/2022)   Exercise Vital Sign    Days of Exercise per Week: 2 days    Minutes of Exercise per Session: 30 min  Stress: Stress Concern Present (12/30/2022)   [place]  [employer] - Occupational Stress Questionnaire    Feeling of Stress : To some extent  Social Connections: Socially Isolated (12/30/2022)   Social Connection and Isolation Panel [NHANES]    Frequency of Communication with Friends and Family: Three times a week    Frequency of Social Gatherings with Friends and Family: Once a week    Attends Religious Services: Never    Database administrator or Organizations: No    Attends Engineer, structural: Never    Marital Status: Never married    Allergies  Allergen Reactions   Metformin And Related Diarrhea    Outpatient Medications Prior to Visit  Medication Sig  Dispense Refill   Blood Glucose Monitoring Suppl (ONE TOUCH ULTRA 2) w/Device KIT 1 each by Does not apply route 3 (three) times daily. 1 each 0   Continuous Blood Gluc Receiver (FREESTYLE LIBRE 2 READER) DEVI Use to check blood sugar three times daily. E11.65 1 each 0   Continuous Blood Gluc Sensor (FREESTYLE LIBRE 2 SENSOR) MISC Use to check blood sugar three times daily. Change sensors once every 2 weeks. E11.65 2 each 3   glucose blood (ONETOUCH VERIO) test strip Use to test blood sugar 2 times daily as instructed 100 each 2   ibuprofen (ADVIL) 800 MG tablet Take 1 tablet (800 mg total) by mouth 3 (three) times daily. 21 tablet 0   Insulin Pen Needle (BD PEN NEEDLE NANO U/F) 32G X 4 MM MISC Use as directed once daily to administer Victoza & Lantus 100 each 3   Lancets (ONETOUCH ULTRASOFT) lancets Use as instructed 100 each 12   levonorgestrel (MIRENA) 20 MCG/24HR IUD 1 each by Intrauterine route once.     ONETOUCH DELICA LANCETS FINE MISC Use to test blood sugar 2 times daily as instructed. 100 each 2   terconazole (TERAZOL 3) 0.8 % vaginal cream Place 1 applicator vaginally at bedtime. 20 g 1   amLODipine (NORVASC) 5 MG tablet Take 1 tablet (5 mg total) by mouth daily. 90 tablet 1   atorvastatin (LIPITOR) 80 MG tablet Take 1 tablet (80 mg total) by mouth daily. 90 tablet 1   busPIRone (BUSPAR) 15 MG tablet Take 1 tablet (15 mg total) by mouth 2 (two) times daily. 180 tablet 1   fluconazole (DIFLUCAN) 150 MG tablet Take 1 tablet (150 mg total) by mouth daily as needed. 15 tablet 0   FLUoxetine (PROZAC) 20 MG capsule Take 1 capsule (20 mg total) by mouth daily. 90 capsule 0   glipiZIDE (GLUCOTROL) 10 MG tablet Take 2 tablets (20 mg total) by mouth 2 (two) times daily before a meal. 360 tablet 0   insulin glargine (LANTUS SOLOSTAR) 100 UNIT/ML Solostar Pen Inject 62 Units into the skin daily. Titrate by 2 units every fourth day until blood sugars are at goal to a maximum of 70 units daily 30 mL 6    insulin lispro (HUMALOG KWIKPEN) 100 UNIT/ML KwikPen Inject 0-12 Units into the skin 3 (three) times daily. Per sliding scale 15 mL 11   tirzepatide (MOUNJARO) 15 MG/0.5ML Pen Inject 15 mg into the skin once a week. 6 mL 6   valsartan (DIOVAN) 320 MG tablet Take 1 tablet (320 mg total) by mouth daily. 90 tablet 1   No facility-administered medications prior to visit.     ROS Review of Systems  Constitutional:  Negative for activity change and appetite change.  HENT:  Negative for sinus pressure and sore throat.  Respiratory:  Negative for chest tightness, shortness of breath and wheezing.   Cardiovascular:  Negative for chest pain and palpitations.  Gastrointestinal:  Negative for abdominal distention, abdominal pain and constipation.  Genitourinary: Negative.   Musculoskeletal: Negative.   Psychiatric/Behavioral:  Negative for behavioral problems and dysphoric mood.     Objective:  BP (!) 148/94   Pulse 81   Ht 5\' 7"  (1.702 m)   Wt 275 lb 6.4 oz (124.9 kg)   SpO2 98%   BMI 43.13 kg/m      12/30/2022    2:32 PM 09/23/2022    4:27 PM 09/23/2022    3:36 PM  BP/Weight  Systolic BP 148 155 137  Diastolic BP 94 99 93  Wt. (Lbs) 275.4  277.8  BMI 43.13 kg/m2  43.51 kg/m2      Physical Exam Constitutional:      Appearance: She is well-developed.  Cardiovascular:     Rate and Rhythm: Normal rate.     Heart sounds: Normal heart sounds. No murmur heard. Pulmonary:     Effort: Pulmonary effort is normal.     Breath sounds: Normal breath sounds. No wheezing or rales.  Chest:     Chest wall: No tenderness.  Abdominal:     General: Bowel sounds are normal. There is no distension.     Palpations: Abdomen is soft. There is no mass.     Tenderness: There is no abdominal tenderness.  Musculoskeletal:        General: Normal range of motion.     Right lower leg: No edema.     Left lower leg: No edema.  Neurological:     Mental Status: She is alert and oriented to person,  place, and time.  Psychiatric:        Mood and Affect: Mood normal.        Latest Ref Rng & Units 09/23/2022    4:50 PM 06/17/2022    4:18 PM 03/18/2022    3:05 PM  CMP  Glucose 70 - 99 mg/dL 94   87   BUN 6 - 24 mg/dL 9   7   Creatinine 7.42 - 1.00 mg/dL 5.95   6.38   Sodium 756 - 144 mmol/L 142   138   Potassium 3.5 - 5.2 mmol/L 3.8  4.4  3.2   Chloride 96 - 106 mmol/L 106   101   CO2 20 - 29 mmol/L 22   20   Calcium 8.7 - 10.2 mg/dL 9.5   8.8   Total Protein 6.0 - 8.5 g/dL 7.1   6.8   Total Bilirubin 0.0 - 1.2 mg/dL 0.6   0.4   Alkaline Phos 44 - 121 IU/L 66   67   AST 0 - 40 IU/L 13   11   ALT 0 - 32 IU/L 19   16     Lipid Panel     Component Value Date/Time   CHOL 151 03/18/2022 1505   TRIG 132 03/18/2022 1505   HDL 47 03/18/2022 1505   CHOLHDL 4.4 06/05/2020 1058   CHOLHDL 5.4 (H) 10/07/2016 1131   VLDL 55 (H) 10/07/2016 1131   LDLCALC 81 03/18/2022 1505    CBC    Component Value Date/Time   WBC 8.4 07/21/2017 1230   RBC 5.45 (H) 07/21/2017 1230   HGB 13.8 07/21/2017 1230   HCT 42.7 07/21/2017 1230   PLT 238 07/21/2017 1230   MCV 78.3 07/21/2017 1230   MCH 25.3 (L)  07/21/2017 1230   MCHC 32.3 07/21/2017 1230   RDW 12.6 07/21/2017 1230   LYMPHSABS 2,592 10/07/2016 1131   MONOABS 672 10/07/2016 1131   EOSABS 96 10/07/2016 1131   BASOSABS 0 10/07/2016 1131    Lab Results  Component Value Date   HGBA1C 7.5 (A) 12/30/2022    Assessment & Plan:      Type 2 Diabetes Mellitus Improved glycemic control with A1C down to 7.5 from 8.2. Currently off medications due to insurance/pharmacy issues. Noted that blood sugars were lower with addition of amlodipine and Prozac. -Refill all medications and send to [facility]. -Increase Lantus to 65 units until [facility] is available, then return to 62 units. -Check fasting labs at next visit in 6 months. -Counseled on Diabetic diet, my plate method, 413 minutes of moderate intensity exercise/week Blood sugar logs with  fasting goals of 80-120 mg/dl, random of less than 244 and in the event of sugars less than 60 mg/dl or greater than 010 mg/dl encouraged to notify the clinic. Advised on the need for annual eye exams, annual foot exams, Pneumonia vaccine.   Hypertension Elevated blood pressure likely due to medication non-adherence due to insurance/pharmacy issues. -Refill amlodipine and monitor blood pressure once medication is resumed. -Counseled on blood pressure goal of less than 130/80, low-sodium, DASH diet, medication compliance, 150 minutes of moderate intensity exercise per week. Discussed medication compliance, adverse effects.   Anxiety Request to increase Prozac dosage. -Increase Prozac to 40mg  daily. -Referral to a different therapist for ongoing management.  Medication Management Difficulty with medication access due to insurance/pharmacy changes. -Refill all medications and send to [facility]. -Advise patient to contact office if any further issues with medication access.  General Health Maintenance -Flu shot received on November 17, 2022. -Follow-up in 6 months or sooner if blood sugars increase or other issues arise.          Meds ordered this encounter  Medications   FLUoxetine (PROZAC) 40 MG capsule    Sig: Take 1 capsule (40 mg total) by mouth daily.    Dispense:  180 capsule    Refill:  1    Dose increase   amLODipine (NORVASC) 5 MG tablet    Sig: Take 1 tablet (5 mg total) by mouth daily.    Dispense:  90 tablet    Refill:  1   atorvastatin (LIPITOR) 80 MG tablet    Sig: Take 1 tablet (80 mg total) by mouth daily.    Dispense:  90 tablet    Refill:  1   busPIRone (BUSPAR) 15 MG tablet    Sig: Take 1 tablet (15 mg total) by mouth 2 (two) times daily.    Dispense:  180 tablet    Refill:  1   fluconazole (DIFLUCAN) 150 MG tablet    Sig: Take 1 tablet (150 mg total) by mouth daily as needed.    Dispense:  15 tablet    Refill:  0   glipiZIDE (GLUCOTROL) 10 MG tablet     Sig: Take 2 tablets (20 mg total) by mouth 2 (two) times daily before a meal.    Dispense:  360 tablet    Refill:  1   insulin glargine (LANTUS SOLOSTAR) 100 UNIT/ML Solostar Pen    Sig: Inject 62 Units into the skin daily. Titrate by 2 units every fourth day until blood sugars are at goal to a maximum of 70 units daily    Dispense:  30 mL    Refill:  6  Dose increase   insulin lispro (HUMALOG KWIKPEN) 100 UNIT/ML KwikPen    Sig: Inject 0-12 Units into the skin 3 (three) times daily. Per sliding scale    Dispense:  15 mL    Refill:  11    For blood sugars 0-150 give 0 units of insulin, 151-200 give 2 units of insulin, 201-250 give 4 units, 251-300 give 6 units, 301-350 give 8 units, 351-400 give 10 units,> 400 give 12 units   tirzepatide (MOUNJARO) 15 MG/0.5ML Pen    Sig: Inject 15 mg into the skin once a week.    Dispense:  6 mL    Refill:  6   valsartan (DIOVAN) 320 MG tablet    Sig: Take 1 tablet (320 mg total) by mouth daily.    Dispense:  90 tablet    Refill:  1    Follow-up: Return in about 6 months (around 06/29/2023) for Chronic medical conditions.       Hoy Register, MD, FAAFP. [facility] [place], [place] [phone]   12/30/2022, 4:38 PM

## 2022-12-30 NOTE — Patient Instructions (Signed)
VISIT SUMMARY:  You came in today for a medication refill and discussed several health concerns, including your diabetes, hypertension, and anxiety. We also talked about the issues you've been having with your new insurance and pharmacy, which have caused you to be without your medications for a few days. Your recent A1c has improved, and we made some adjustments to your medications to help manage your conditions better.  YOUR PLAN:  -TYPE 2 DIABETES MELLITUS: Type 2 Diabetes Mellitus is a condition where your body does not use insulin properly, leading to high blood sugar levels. Your A1c has improved from 8.2 to 7.5, which is a positive change. We will refill all your diabetes medications and send them to [facility]. For now, increase your Lantus to 65 units until [facility] is available, then return to 62 units. We will check your fasting labs at your next visit in 6 months.  -HYPERTENSION: Hypertension, or high blood pressure, can lead to serious health issues if not managed properly. Your blood pressure is elevated likely due to not taking your medications because of the insurance and pharmacy changes. We will refill your amlodipine and you should monitor your blood pressure once you resume taking your medication.  -ANXIETY: Anxiety is a condition that causes excessive worry or fear. You requested an increase in your Prozac dosage, so we will increase it to 40mg  daily. Additionally, we will refer you to a different therapist for ongoing management.  -MEDICATION MANAGEMENT: You have had difficulty accessing your medications due to changes in your insurance and pharmacy. We will refill all your medications and send them to [facility]. Please contact our office if you have any further issues with medication access.  -GENERAL HEALTH MAINTENANCE: You received your flu shot on November 17, 2022. We will follow up in 6 months or sooner if your blood sugars increase or if other issues  arise.  INSTRUCTIONS:  Refill all medications and send them to [facility]. Increase Lantus to 65 units until [facility] is available, then return to 62 units. Monitor your blood pressure once you resume taking amlodipine. Increase Prozac to 40mg  daily. Referral to a different therapist for ongoing management. Follow-up in 6 months or sooner if blood sugars increase or other issues arise.

## 2022-12-31 ENCOUNTER — Telehealth: Payer: Self-pay | Admitting: *Deleted

## 2022-12-31 NOTE — Progress Notes (Signed)
  Care Coordination   Note   12/31/2022 Name: Lynn Anderson MRN: [id number] DOB: 1980/07/11  Lynn Anderson is a 42 y.o. year old female who sees Hoy Register, MD for primary care. I reached out to [employer] by phone today to offer care coordination services.  Ms. Bartoletti was given information about Care Coordination services today including:   The Care Coordination services include support from the care team which includes your Nurse Coordinator, Clinical Social Worker, or Pharmacist.  The Care Coordination team is here to help remove barriers to the health concerns and goals most important to you. Care Coordination services are voluntary, and the patient may decline or stop services at any time by request to their care team member.   Care Coordination Consent Status: Patient agreed to services and verbal consent obtained.     Encounter Outcome:  Patient wants to Call Back to schedule   [facility] Guide  Direct Dial: [phone]

## 2023-01-08 NOTE — Progress Notes (Unsigned)
  Care Coordination  Outreach Note  01/08/2023 Name: Lynn Anderson MRN: [id number] DOB: December 24, 1980   Care Coordination Outreach Attempts: A second unsuccessful outreach was attempted today to offer the patient with information about available care coordination services.  Follow Up Plan:  Additional outreach attempts will be made to offer the patient care coordination information and services.   Encounter Outcome:  No Answer  Gwenevere Ghazi  Care Coordination Care Guide  Direct Dial: [phone]

## 2023-01-11 NOTE — Progress Notes (Signed)
  Care Coordination   Note   01/11/2023 Name: Lynn Anderson MRN: [id number] DOB: March 24, 1980  Lynn Anderson is a 42 y.o. year old female who sees Hoy Register, MD for primary care. I reached out to [employer] by phone today to offer care coordination services.  [name] was given information about Care Coordination services today including:   The Care Coordination services include support from the care team which includes your Nurse Coordinator, Clinical Social Worker, or Pharmacist.  The Care Coordination team is here to help remove barriers to the health concerns and goals most important to you. Care Coordination services are voluntary, and the patient may decline or stop services at any time by request to their care team member.   Care Coordination Consent Status: Patient agreed to services and verbal consent obtained.   Follow up plan:  Telephone appointment with care coordination team member scheduled for:  01/19/23  Encounter Outcome:  Patient Scheduled  [facility] Coordination Care Guide  Direct Dial: [phone]

## 2023-01-11 NOTE — Progress Notes (Signed)
  Care Coordination  Outreach Note  01/11/2023 Name: Lynn Anderson MRN: [id number] DOB: [birth year]   Care Coordination Outreach Attempts: A third unsuccessful outreach was attempted today to offer the patient with information about available care coordination services.  Follow Up Plan:  No further outreach attempts will be made at this time. We have been unable to contact the patient to offer or enroll patient in care coordination services  Encounter Outcome:  No Answer.  [facility]  Care Coordination Care Guide  Direct Dial: [phone]

## 2023-01-19 ENCOUNTER — Ambulatory Visit: Payer: Self-pay | Admitting: Licensed Clinical Social Worker

## 2023-01-19 NOTE — Patient Outreach (Signed)
  Care Coordination   Initial Visit Note   01/19/2023 Name: Lynn Anderson MRN: [id number] DOB: 1980-09-03  Lynn Anderson is a 42 y.o. year old female who sees Hoy Register, MD for primary care. I spoke with  Lynn Anderson by phone today.  What matters to the patients health and wellness today?  Supportive Resources    Goals Addressed             This Visit's Progress    Obtain Supportive Resources-MH Management   On track    Activities and task to complete in order to accomplish goals.   Keep all upcoming appointments discussed today Continue with compliance of taking medication prescribed by Doctor Implement healthy coping skills discussed to assist with management of symptoms Review counseling resources provided and/or discussed         SDOH assessments and interventions completed:  Yes  SDOH Interventions Today    Flowsheet Row Most Recent Value  SDOH Interventions   Food Insecurity Interventions Intervention Not Indicated  Housing Interventions Intervention Not Indicated  Transportation Interventions Intervention Not Indicated        Care Coordination Interventions:  Yes, provided  Interventions Today    Flowsheet Row Most Recent Value  Chronic Disease   Chronic disease during today's visit Hypertension (HTN), Diabetes, Other  [PTSD, Anxiety]  General Interventions   General Interventions Discussed/Reviewed General Interventions Discussed, Programmer, applications, Doctor Visits  Doctor Visits Discussed/Reviewed Doctor Visits Discussed  Mental Health Interventions   Mental Health Discussed/Reviewed Mental Health Discussed, Coping Strategies, Anxiety  Pharmacy Interventions   Pharmacy Dicussed/Reviewed Pharmacy Topics Discussed, Medication Adherence       Follow up plan: Follow up call scheduled for 4 weeks    Encounter Outcome:  Patient Visit Completed   Jenel Lucks, MSW, LCSW [facility] Care Management [facility]  [facility] [email or web address] Phone [phone] 11:03 AM

## 2023-01-19 NOTE — Patient Instructions (Signed)
Visit Information  Thank you for taking time to visit with me today. Please don't hesitate to contact me if I can be of assistance to you.   Following are the goals we discussed today:   Goals Addressed             This Visit's Progress    Obtain Supportive Resources-MH Management   On track    Activities and task to complete in order to accomplish goals.   Keep all upcoming appointments discussed today Continue with compliance of taking medication prescribed by Doctor Implement healthy coping skills discussed to assist with management of symptoms Review counseling resources provided and/or discussed         Our next appointment is by telephone on 01/07 at 3 PM  Please call the care guide team at [phone] if you need to cancel or reschedule your appointment.   If you are experiencing a Mental Health or Behavioral Health Crisis or need someone to talk to, please call the Suicide and Crisis Lifeline: 988 call 911   Patient verbalizes understanding of instructions and care plan provided today and agrees to view in MyChart. Active MyChart status and patient understanding of how to access instructions and care plan via MyChart confirmed with patient.     Jenel Lucks, MSW, LCSW [facility] Care Management [place]  [facility] [email or web address] Phone [phone] 11:03 AM

## 2023-02-16 ENCOUNTER — Telehealth: Payer: Self-pay | Admitting: Licensed Clinical Social Worker

## 2023-02-16 ENCOUNTER — Encounter: Payer: Self-pay | Admitting: Licensed Clinical Social Worker

## 2023-02-16 NOTE — Patient Outreach (Signed)
  Care Coordination   02/16/2023 Name: Lynn Anderson MRN: [id number] DOB: 01/05/1981   Care Coordination Outreach Attempts:  An unsuccessful outreach was attempted for an appointment today.  Follow Up Plan:  Additional outreach attempts will be made to offer the patient complex care management information and services.   Encounter Outcome:  No Answer   Care Coordination Interventions:  No, not indicated    Rolin Kerns, MSW, LCSW [facility] Care Management [place]  [facility] [email or web address] Phone [phone] 4:21 PM

## 2023-04-02 ENCOUNTER — Other Ambulatory Visit: Payer: Self-pay

## 2023-06-16 ENCOUNTER — Other Ambulatory Visit (HOSPITAL_COMMUNITY): Payer: Self-pay

## 2023-06-16 ENCOUNTER — Other Ambulatory Visit: Payer: Self-pay

## 2023-06-16 ENCOUNTER — Other Ambulatory Visit: Payer: Self-pay | Admitting: Family Medicine

## 2023-06-16 DIAGNOSIS — B3731 Acute candidiasis of vulva and vagina: Secondary | ICD-10-CM

## 2023-06-16 MED ORDER — FLUCONAZOLE 150 MG PO TABS
150.0000 mg | ORAL_TABLET | Freq: Every day | ORAL | 0 refills | Status: DC | PRN
  Filled 2023-06-16: qty 15, 15d supply, fill #0

## 2023-06-16 NOTE — Telephone Encounter (Signed)
 Copied from CRM [id number]. Topic: Clinical - Medication Refill >> Jun 16, 2023 11:35 AM Sasha H wrote: Medication: terconazole  (TERAZOL 3 ) 0.8 % vaginal cream  Has the patient contacted their pharmacy? No (Agent: If no, request that the patient contact the pharmacy for the refill. If patient does not wish to contact the pharmacy document the reason why and proceed with request.) (Agent: If yes, when and what did the pharmacy advise?)  This is the patient's preferred pharmacy:  [facility] - [place], [place] - [address]  Is this the correct pharmacy for this prescription? Yes If no, delete pharmacy and type the correct one.   Has the prescription been filled recently? Yes  Is the patient out of the medication? Yes  Has the patient been seen for an appointment in the last year OR does the patient have an upcoming appointment? Yes  Can we respond through MyChart? Yes  Agent: Please be advised that Rx refills may take up to 3 business days. We ask that you follow-up with your pharmacy.

## 2023-06-17 ENCOUNTER — Other Ambulatory Visit: Payer: Self-pay

## 2023-06-17 ENCOUNTER — Other Ambulatory Visit: Payer: Self-pay | Admitting: Family Medicine

## 2023-06-17 DIAGNOSIS — B3731 Acute candidiasis of vulva and vagina: Secondary | ICD-10-CM

## 2023-06-17 NOTE — Telephone Encounter (Signed)
 Copied from CRM [id number]. Topic: Clinical - Medication Refill >> Jun 17, 2023  9:49 AM Lotus Round B wrote: Medication: fluconazole  (DIFLUCAN ) 150 MG tablet . terconazole  (TERAZOL 3 ) 0.8 % vaginal cream   Has the patient contacted their pharmacy? Yes (Agent: If no, request that the patient contact the pharmacy for the refill. If patient does not wish to contact the pharmacy document the reason why and proceed with request.) (Agent: If yes, when and what did the pharmacy advise?)  This is the patient's preferred pharmacy:  [facility] - [place], [place] - [address]  Is this the correct pharmacy for this prescription? Yes If no, delete pharmacy and type the correct one.   Has the prescription been filled recently? No  Is the patient out of the medication? Yes  Has the patient been seen for an appointment in the last year OR does the patient have an upcoming appointment? Yes  Can we respond through MyChart? Yes  Agent: Please be advised that Rx refills may take up to 3 business days. We ask that you follow-up with your pharmacy.

## 2023-06-18 ENCOUNTER — Telehealth: Payer: Self-pay | Admitting: Family Medicine

## 2023-06-18 MED ORDER — TERCONAZOLE 0.8 % VA CREA: 1.0000 | TOPICAL_CREAM | Freq: Every day | VAGINAL | 1 refills | Status: DC

## 2023-06-18 NOTE — Telephone Encounter (Signed)
 Error

## 2023-06-18 NOTE — Telephone Encounter (Signed)
 Requested medications are due for refill today.  yes  Requested medications are on the active medications list.  yes  Last refill. 08/27/2021 20 g 1 rf  Future visit scheduled.   yes  Notes to clinic.  Medication not assigned to a protocol. Please review for refill.    Requested Prescriptions  Pending Prescriptions Disp Refills   terconazole  (TERAZOL 3 ) 0.8 % vaginal cream 20 g 1    Sig: Place 1 applicator vaginally at bedtime.     Off-Protocol Failed - 06/18/2023 11:47 AM      Failed - Medication not assigned to a protocol, review manually.      Passed - Valid encounter within last 12 months    Recent Outpatient Visits           5 months ago Type 2 diabetes mellitus with hyperglycemia, without long-term current use of insulin  (HCC)   [facility] Dept Of [place]. [facility] Joaquin Mulberry, MD   8 months ago Type 2 diabetes mellitus with hyperglycemia, without long-term current use of insulin  [facility])   [facility] - [facility] Dept Of [place]. [facility] Joaquin Mulberry, MD   1 year ago Type 2 diabetes mellitus with hyperglycemia, without long-term current use of insulin  [facility])   [facility] - A Dept Of [place]. [facility] Joaquin Mulberry, MD   1 year ago Type 2 diabetes mellitus with hyperglycemia, without long-term current use of insulin  [facility])   [facility] - A Dept Of [place]. [facility] Joaquin Mulberry, MD   1 year ago Type 2 diabetes mellitus with hyperglycemia, without long-term current use of insulin  [facility])    [facility] - A Dept Of [place]. [facility] Joaquin Mulberry, MD       Future Appointments             In 1 week Joaquin Mulberry, MD [facility] - A Dept Of [place]. [facility]

## 2023-06-18 NOTE — Telephone Encounter (Signed)
 Requested Prescriptions  Refused Prescriptions Disp Refills   fluconazole  (DIFLUCAN ) 150 MG tablet 15 tablet 0    Sig: Take 1 tablet (150 mg total) by mouth daily as needed.     Off-Protocol Failed - 06/18/2023  5:09 PM      Failed - Medication not assigned to a protocol, review manually.      Passed - Valid encounter within last 12 months    Recent Outpatient Visits           5 months ago Type 2 diabetes mellitus with hyperglycemia, without long-term current use of insulin  (HCC)   [facility] - A Dept Of [place]. [facility] Joaquin Mulberry, MD   8 months ago Type 2 diabetes mellitus with hyperglycemia, without long-term current use of insulin  [facility])   [facility] [place] [facility] - A Dept Of [place]. [facility] Joaquin Mulberry, MD   1 year ago Type 2 diabetes mellitus with hyperglycemia, without long-term current use of insulin  [facility])   [place] [facility] - A Dept Of [place]. [facility] Joaquin Mulberry, MD   1 year ago Type 2 diabetes mellitus with hyperglycemia, without long-term current use of insulin  [facility])   [facility] [facility] - A Dept Of [place]. [facility] Joaquin Mulberry, MD   1 year ago Type 2 diabetes mellitus with hyperglycemia, without long-term current use of insulin  [facility])    [facility] - A Dept Of . [facility] Joaquin Mulberry, MD       Future Appointments             In 1 week Joaquin Mulberry, MD [facility] [place] - A Dept Of [place]. [facility]

## 2023-06-18 NOTE — Telephone Encounter (Signed)
 Pt called office t oget her refill ( DIFLUCAN  )sent to   [facility]   - [place], [facility] [address] [address] [place] [address] Phone: [phone]  Fax: [phone]

## 2023-06-21 ENCOUNTER — Other Ambulatory Visit (HOSPITAL_COMMUNITY): Payer: Self-pay

## 2023-06-21 ENCOUNTER — Other Ambulatory Visit: Payer: Self-pay

## 2023-06-21 DIAGNOSIS — B3731 Acute candidiasis of vulva and vagina: Secondary | ICD-10-CM

## 2023-06-21 MED ORDER — FLUCONAZOLE 150 MG PO TABS: 150.0000 mg | ORAL_TABLET | Freq: Every day | ORAL | 0 refills | Status: DC | PRN

## 2023-06-21 NOTE — Telephone Encounter (Signed)
 Medication has been sent to pharmacy.

## 2023-06-22 ENCOUNTER — Other Ambulatory Visit: Payer: Self-pay

## 2023-06-30 ENCOUNTER — Ambulatory Visit: Payer: Managed Care, Other (non HMO) | Attending: Family Medicine | Admitting: Family Medicine

## 2023-06-30 ENCOUNTER — Encounter: Payer: Self-pay | Admitting: Family Medicine

## 2023-06-30 VITALS — BP 115/77 | HR 103 | Ht 67.0 in | Wt 268.2 lb

## 2023-06-30 DIAGNOSIS — F419 Anxiety disorder, unspecified: Secondary | ICD-10-CM | POA: Diagnosis not present

## 2023-06-30 DIAGNOSIS — I152 Hypertension secondary to endocrine disorders: Secondary | ICD-10-CM

## 2023-06-30 DIAGNOSIS — Z7984 Long term (current) use of oral hypoglycemic drugs: Secondary | ICD-10-CM

## 2023-06-30 DIAGNOSIS — E785 Hyperlipidemia, unspecified: Secondary | ICD-10-CM

## 2023-06-30 DIAGNOSIS — Z7985 Long-term (current) use of injectable non-insulin antidiabetic drugs: Secondary | ICD-10-CM

## 2023-06-30 DIAGNOSIS — Z794 Long term (current) use of insulin: Secondary | ICD-10-CM

## 2023-06-30 DIAGNOSIS — E1169 Type 2 diabetes mellitus with other specified complication: Secondary | ICD-10-CM

## 2023-06-30 DIAGNOSIS — E1159 Type 2 diabetes mellitus with other circulatory complications: Secondary | ICD-10-CM

## 2023-06-30 DIAGNOSIS — E1165 Type 2 diabetes mellitus with hyperglycemia: Secondary | ICD-10-CM

## 2023-06-30 LAB — POCT GLYCOSYLATED HEMOGLOBIN (HGB A1C): HbA1c, POC (controlled diabetic range): 6.1 % (ref 0.0–7.0)

## 2023-06-30 MED ORDER — LANTUS SOLOSTAR 100 UNIT/ML ~~LOC~~ SOPN: 62.0000 [IU] | PEN_INJECTOR | Freq: Every day | SUBCUTANEOUS | 6 refills | Status: DC

## 2023-06-30 MED ORDER — TIRZEPATIDE 15 MG/0.5ML ~~LOC~~ SOAJ: 15.0000 mg | SUBCUTANEOUS | 6 refills | Status: DC

## 2023-06-30 MED ORDER — BUSPIRONE HCL 15 MG PO TABS: 15.0000 mg | ORAL_TABLET | Freq: Two times a day (BID) | ORAL | 1 refills | Status: DC

## 2023-06-30 MED ORDER — GLIPIZIDE 10 MG PO TABS: 20.0000 mg | ORAL_TABLET | Freq: Two times a day (BID) | ORAL | 1 refills | Status: DC

## 2023-06-30 MED ORDER — ATORVASTATIN CALCIUM 80 MG PO TABS: 80.0000 mg | ORAL_TABLET | Freq: Every day | ORAL | 1 refills | Status: DC

## 2023-06-30 MED ORDER — AMLODIPINE BESYLATE 5 MG PO TABS: 5.0000 mg | ORAL_TABLET | Freq: Every day | ORAL | 1 refills | Status: DC

## 2023-06-30 MED ORDER — FLUOXETINE HCL 40 MG PO CAPS: 40.0000 mg | ORAL_CAPSULE | Freq: Every day | ORAL | 1 refills | Status: DC

## 2023-06-30 MED ORDER — VALSARTAN 320 MG PO TABS: 320.0000 mg | ORAL_TABLET | Freq: Every day | ORAL | 1 refills | Status: DC

## 2023-06-30 NOTE — Progress Notes (Signed)
 Subjective:  Patient ID: Lynn Anderson, female    DOB: 10-Feb-1980  Age: 43 y.o. MRN: [id number]  CC: Medical Management of Chronic Issues (No concerns)     Discussed the use of AI scribe software for clinical note transcription with the patient, who gave verbal consent to proceed.  History of Present Illness Lynn Anderson "Beauford Bounds" is a 43 year old female with diabetes and anxiety who presents for routine follow-up.  Her diabetes management has improved, with A1c decreasing from 7.5 to 6.1. She experiences no hypoglycemia. Her regimen includes 62 units of Lantus , Humalog  as needed, per side and Mounjaro  15 mg, which has contributed to weight loss.  Anxiety is well-controlled with Buspar  and Prozac , with no new concerns.  She has no numbness or tingling in her extremities and no blurry vision. Her last eye exam was a year ago, and she wears glasses. Occasional foot swelling occurs when standing for long periods in the OR, managed with compression stockings. No leg pain is present.    Past Medical History:  Diagnosis Date   Diabetes mellitus without complication (HCC)    Hyperlipidemia    Hypertension     Past Surgical History:  Procedure Laterality Date   CHOLECYSTECTOMY     Mirena       Inserted 10-16-14    Family History  Problem Relation Age of Onset   Hyperlipidemia Mother    Hypertension Mother    Diabetes Mother    Diabetes Maternal Grandmother    Heart disease Father    Diabetes Maternal Aunt     Social History   Socioeconomic History   Marital status: Single    Spouse name: Not on file   Number of children: Not on file   Years of education: Not on file   Highest education level: Not on file  Occupational History   Not on file  Tobacco Use   Smoking status: Never   Smokeless tobacco: Never  Vaping Use   Vaping status: Never Used  Substance and Sexual Activity   Alcohol use: No    Alcohol/week: 0.0 standard drinks of alcohol   Drug use: No    Sexual activity: Not Currently    Birth control/protection: I.U.D.    Comment: Mirena   inserted 10-16-14-INTERCOURSE AGE 41,MORE THAN 5  SEXUAL PARTNERS  Other Topics Concern   Not on file  Social History Narrative   Not on file   Social Drivers of Health   Financial Resource Strain: High Risk (12/30/2022)   Overall Financial Resource Strain (CARDIA)    Difficulty of Paying Living Expenses: Very hard  Food Insecurity: No Food Insecurity (01/19/2023)   Hunger Vital Sign    Worried About Running Out of Food in the Last Year: Never true    Ran Out of Food in the Last Year: Never true  Recent Concern: Food Insecurity - Food Insecurity Present (12/30/2022)   Hunger Vital Sign    Worried About Running Out of Food in the Last Year: Sometimes true    Ran Out of Food in the Last Year: Sometimes true  Transportation Needs: No Transportation Needs (01/19/2023)   PRAPARE - Administrator, Civil Service (Medical): No    Lack of Transportation (Non-Medical): No  Physical Activity: Insufficiently Active (12/30/2022)   Exercise Vital Sign    Days of Exercise per Week: 2 days    Minutes of Exercise per Session: 30 min  Stress: Stress Concern Present (12/30/2022)   [employer] of Occupational  Health - Occupational Stress Questionnaire    Feeling of Stress : To some extent  Social Connections: Socially Isolated (12/30/2022)   Social Connection and Isolation Panel [NHANES]    Frequency of Communication with Friends and Family: Three times a week    Frequency of Social Gatherings with Friends and Family: Once a week    Attends Religious Services: Never    Database administrator or Organizations: No    Attends Engineer, structural: Never    Marital Status: Never married    Allergies  Allergen Reactions   Metformin  And Related Diarrhea    Outpatient Medications Prior to Visit  Medication Sig Dispense Refill   Blood Glucose Monitoring Suppl (ONE TOUCH ULTRA 2)  w/Device KIT 1 each by Does not apply route 3 (three) times daily. 1 each 0   Continuous Blood Gluc Receiver (FREESTYLE LIBRE 2 READER) DEVI Use to check blood sugar three times daily. E11.65 1 each 0   Continuous Blood Gluc Sensor (FREESTYLE LIBRE 2 SENSOR) MISC Use to check blood sugar three times daily. Change sensors once every 2 weeks. E11.65 2 each 3   fluconazole  (DIFLUCAN ) 150 MG tablet Take 1 tablet (150 mg total) by mouth daily as needed. 15 tablet 0   glucose blood (ONETOUCH VERIO) test strip Use to test blood sugar 2 times daily as instructed 100 each 2   ibuprofen  (ADVIL ) 800 MG tablet Take 1 tablet (800 mg total) by mouth 3 (three) times daily. 21 tablet 0   insulin  lispro (HUMALOG  KWIKPEN) 100 UNIT/ML KwikPen Inject 0-12 Units into the skin 3 (three) times daily. Per sliding scale 15 mL 11   Insulin  Pen Needle (BD PEN NEEDLE NANO U/F) 32G X 4 MM MISC Use as directed once daily to administer Victoza  & Lantus  100 each 3   Lancets (ONETOUCH ULTRASOFT) lancets Use as instructed 100 each 12   levonorgestrel  (MIRENA ) 20 MCG/24HR IUD 1 each by Intrauterine route once.     ONETOUCH DELICA LANCETS FINE MISC Use to test blood sugar 2 times daily as instructed. 100 each 2   terconazole  (TERAZOL 3 ) 0.8 % vaginal cream Place 1 applicator vaginally at bedtime. 20 g 1   amLODipine  (NORVASC ) 5 MG tablet Take 1 tablet (5 mg total) by mouth daily. 90 tablet 1   atorvastatin  (LIPITOR) 80 MG tablet Take 1 tablet (80 mg total) by mouth daily. 90 tablet 1   busPIRone  (BUSPAR ) 15 MG tablet Take 1 tablet (15 mg total) by mouth 2 (two) times daily. 180 tablet 1   FLUoxetine  (PROZAC ) 40 MG capsule Take 1 capsule (40 mg total) by mouth daily. 180 capsule 1   glipiZIDE  (GLUCOTROL ) 10 MG tablet Take 2 tablets (20 mg total) by mouth 2 (two) times daily before a meal. 360 tablet 1   insulin  glargine (LANTUS  SOLOSTAR) 100 UNIT/ML Solostar Pen Inject 62 Units into the skin daily. Titrate by 2 units every fourth day  until blood sugars are at goal to a maximum of 70 units daily 30 mL 6   tirzepatide  (MOUNJARO ) 15 MG/0.5ML Pen Inject 15 mg into the skin once a week. 6 mL 6   valsartan  (DIOVAN ) 320 MG tablet Take 1 tablet (320 mg total) by mouth daily. 90 tablet 1   No facility-administered medications prior to visit.     ROS Review of Systems  Constitutional:  Negative for activity change and appetite change.  HENT:  Negative for sinus pressure and sore throat.   Respiratory:  Negative for chest tightness, shortness of breath and wheezing.   Cardiovascular:  Negative for chest pain and palpitations.  Gastrointestinal:  Negative for abdominal distention, abdominal pain and constipation.  Genitourinary: Negative.   Musculoskeletal: Negative.   Psychiatric/Behavioral:  Negative for behavioral problems and dysphoric mood.     Objective:  BP 115/77   Pulse (!) 103   Ht 5\' 7"  (1.702 m)   Wt 268 lb 3.2 oz (121.7 kg)   SpO2 98%   BMI 42.01 kg/m      06/30/2023    3:07 PM 12/30/2022    2:32 PM 09/23/2022    4:27 PM  BP/Weight  Systolic BP 115 148 155  Diastolic BP 77 94 99  Wt. (Lbs) 268.2 275.4   BMI 42.01 kg/m2 43.13 kg/m2     Wt Readings from Last 3 Encounters:  06/30/23 268 lb 3.2 oz (121.7 kg)  12/30/22 275 lb 6.4 oz (124.9 kg)  09/23/22 277 lb 12.8 oz (126 kg)      Physical Exam Constitutional:      Appearance: She is well-developed.  Cardiovascular:     Rate and Rhythm: Tachycardia present.     Heart sounds: Normal heart sounds. No murmur heard. Pulmonary:     Effort: Pulmonary effort is normal.     Breath sounds: Normal breath sounds. No wheezing or rales.  Chest:     Chest wall: No tenderness.  Abdominal:     General: Bowel sounds are normal. There is no distension.     Palpations: Abdomen is soft. There is no mass.     Tenderness: There is no abdominal tenderness.  Musculoskeletal:        General: Normal range of motion.     Right lower leg: No edema.     Left  lower leg: No edema.  Neurological:     Mental Status: She is alert and oriented to person, place, and time.  Psychiatric:        Mood and Affect: Mood normal.        Latest Ref Rng & Units 09/23/2022    4:50 PM 06/17/2022    4:18 PM 03/18/2022    3:05 PM  CMP  Glucose 70 - 99 mg/dL 94   87   BUN 6 - 24 mg/dL 9   7   Creatinine 1.30 - 1.00 mg/dL 8.65   7.84   Sodium 696 - 144 mmol/L 142   138   Potassium 3.5 - 5.2 mmol/L 3.8  4.4  3.2   Chloride 96 - 106 mmol/L 106   101   CO2 20 - 29 mmol/L 22   20   Calcium  8.7 - 10.2 mg/dL 9.5   8.8   Total Protein 6.0 - 8.5 g/dL 7.1   6.8   Total Bilirubin 0.0 - 1.2 mg/dL 0.6   0.4   Alkaline Phos 44 - 121 IU/L 66   67   AST 0 - 40 IU/L 13   11   ALT 0 - 32 IU/L 19   16     Lipid Panel     Component Value Date/Time   CHOL 151 03/18/2022 1505   TRIG 132 03/18/2022 1505   HDL 47 03/18/2022 1505   CHOLHDL 4.4 06/05/2020 1058   CHOLHDL 5.4 (H) 10/07/2016 1131   VLDL 55 (H) 10/07/2016 1131   LDLCALC 81 03/18/2022 1505    CBC    Component Value Date/Time   WBC 8.4 07/21/2017 1230   RBC 5.45 (H)  07/21/2017 1230   HGB 13.8 07/21/2017 1230   HCT 42.7 07/21/2017 1230   PLT 238 07/21/2017 1230   MCV 78.3 07/21/2017 1230   MCH 25.3 (L) 07/21/2017 1230   MCHC 32.3 07/21/2017 1230   RDW 12.6 07/21/2017 1230   LYMPHSABS 2,592 10/07/2016 1131   MONOABS 672 10/07/2016 1131   EOSABS 96 10/07/2016 1131   BASOSABS 0 10/07/2016 1131    Lab Results  Component Value Date   HGBA1C 6.1 06/30/2023    Lab Results  Component Value Date   HGBA1C 6.1 06/30/2023   HGBA1C 7.5 (A) 12/30/2022   HGBA1C 8.2 (A) 09/23/2022       1. Type 2 diabetes mellitus with hyperglycemia, without long-term current use of insulin  (HCC) (Primary) Controlled with A1c of 6.1 Continue current regimen Counseled on Diabetic diet, my plate method, 098 minutes of moderate intensity exercise/week Blood sugar logs with fasting goals of 80-120 mg/dl, random of less  than 119 and in the event of sugars less than 60 mg/dl or greater than 147 mg/dl encouraged to notify the clinic. Advised on the need for annual eye exams, annual foot exams, Pneumonia vaccine. - POCT glycosylated hemoglobin (Hb A1C) - Microalbumin/Creatinine Ratio, Urine - CMP14+EGFR - LP+Non-HDL Cholesterol - CBC with Differential/Platelet - glipiZIDE  (GLUCOTROL ) 10 MG tablet; Take 2 tablets (20 mg total) by mouth 2 (two) times daily before a meal.  Dispense: 360 tablet; Refill: 1 - insulin  glargine (LANTUS  SOLOSTAR) 100 UNIT/ML Solostar Pen; Inject 62 Units into the skin daily. Titrate by 2 units every fourth day until blood sugars are at goal to a maximum of 70 units daily  Dispense: 30 mL; Refill: 6 - tirzepatide  (MOUNJARO ) 15 MG/0.5ML Pen; Inject 15 mg into the skin once a week.  Dispense: 6 mL; Refill: 6  2. Hypertension associated with diabetes (HCC) Controlled Counseled on blood pressure goal of less than 130/80, low-sodium, DASH diet, medication compliance, 150 minutes of moderate intensity exercise per week. Discussed medication compliance, adverse effects. - amLODipine  (NORVASC ) 5 MG tablet; Take 1 tablet (5 mg total) by mouth daily.  Dispense: 90 tablet; Refill: 1 - valsartan  (DIOVAN ) 320 MG tablet; Take 1 tablet (320 mg total) by mouth daily.  Dispense: 90 tablet; Refill: 1  3. Hyperlipidemia associated with type 2 diabetes mellitus (HCC) Controlled Low-cholesterol diet - atorvastatin  (LIPITOR) 80 MG tablet; Take 1 tablet (80 mg total) by mouth daily.  Dispense: 90 tablet; Refill: 1  4. Anxiety Controlled - busPIRone  (BUSPAR ) 15 MG tablet; Take 1 tablet (15 mg total) by mouth 2 (two) times daily.  Dispense: 180 tablet; Refill: 1 - FLUoxetine  (PROZAC ) 40 MG capsule; Take 1 capsule (40 mg total) by mouth daily.  Dispense: 180 capsule; Refill: 1  5. Long term current use of insulin  (HCC) - insulin  glargine (LANTUS  SOLOSTAR) 100 UNIT/ML Solostar Pen; Inject 62 Units into the  skin daily. Titrate by 2 units every fourth day until blood sugars are at goal to a maximum of 70 units daily  Dispense: 30 mL; Refill: 6  6. Long-term current use of injectable noninsulin antidiabetic medication - tirzepatide  (MOUNJARO ) 15 MG/0.5ML Pen; Inject 15 mg into the skin once a week.  Dispense: 6 mL; Refill: 6  7. Long term current use of oral hypoglycemic drug - glipiZIDE  (GLUCOTROL ) 10 MG tablet; Take 2 tablets (20 mg total) by mouth 2 (two) times daily before a meal.  Dispense: 360 tablet; Refill: 1  Meds ordered this encounter  Medications   amLODipine  (NORVASC ) 5  MG tablet    Sig: Take 1 tablet (5 mg total) by mouth daily.    Dispense:  90 tablet    Refill:  1   atorvastatin  (LIPITOR) 80 MG tablet    Sig: Take 1 tablet (80 mg total) by mouth daily.    Dispense:  90 tablet    Refill:  1   busPIRone  (BUSPAR ) 15 MG tablet    Sig: Take 1 tablet (15 mg total) by mouth 2 (two) times daily.    Dispense:  180 tablet    Refill:  1   FLUoxetine  (PROZAC ) 40 MG capsule    Sig: Take 1 capsule (40 mg total) by mouth daily.    Dispense:  180 capsule    Refill:  1   glipiZIDE  (GLUCOTROL ) 10 MG tablet    Sig: Take 2 tablets (20 mg total) by mouth 2 (two) times daily before a meal.    Dispense:  360 tablet    Refill:  1   insulin  glargine (LANTUS  SOLOSTAR) 100 UNIT/ML Solostar Pen    Sig: Inject 62 Units into the skin daily. Titrate by 2 units every fourth day until blood sugars are at goal to a maximum of 70 units daily    Dispense:  30 mL    Refill:  6    Dose increase   tirzepatide  (MOUNJARO ) 15 MG/0.5ML Pen    Sig: Inject 15 mg into the skin once a week.    Dispense:  6 mL    Refill:  6   valsartan  (DIOVAN ) 320 MG tablet    Sig: Take 1 tablet (320 mg total) by mouth daily.    Dispense:  90 tablet    Refill:  1    Follow-up: Return in about 6 months (around 12/31/2023) for Chronic medical conditions.       Joaquin Mulberry, MD, FAAFP. [facility] [place], [place] [phone]   06/30/2023, 3:45 PM

## 2023-06-30 NOTE — Patient Instructions (Signed)
 VISIT SUMMARY:  Today, you came in for a routine follow-up appointment to manage your diabetes and anxiety. We reviewed your current health status and made plans to continue your treatment regimen.  YOUR PLAN:  -TYPE 2 DIABETES MELLITUS: Your diabetes is well-controlled with an A1c of 6.1, which is a measure of your average blood sugar levels over the past three months. You are not experiencing low blood sugar episodes. Continue taking Lantus , Humalog  as needed, and Mounjaro  15 mg. Keep monitoring your blood glucose regularly and let us  know if you experience any symptoms of low blood sugar.  -HYPERTENSION: Your blood pressure is well-controlled with your current medications. Continue taking your antihypertensive medications as prescribed.  -ANXIETY: Your anxiety is well-managed with Buspar  and Prozac , and you have no new symptoms. Continue taking Buspar  and Prozac  as prescribed.  INSTRUCTIONS:  We will perform blood work to check your cholesterol, urine analysis, and complete blood count (CBC). We will also recheck your heart rate due to an initial elevation. Please schedule follow-up appointments as needed, and we will refill your medications for six months.

## 2023-07-01 ENCOUNTER — Ambulatory Visit: Payer: Self-pay | Admitting: Family Medicine

## 2023-07-01 LAB — CMP14+EGFR
ALT: 18 IU/L (ref 0–32)
AST: 15 IU/L (ref 0–40)
Albumin: 4.2 g/dL (ref 3.9–4.9)
Alkaline Phosphatase: 65 IU/L (ref 44–121)
BUN/Creatinine Ratio: 11 (ref 9–23)
BUN: 9 mg/dL (ref 6–24)
Bilirubin Total: 0.5 mg/dL (ref 0.0–1.2)
CO2: 19 mmol/L — ABNORMAL LOW (ref 20–29)
Calcium: 9.1 mg/dL (ref 8.7–10.2)
Chloride: 107 mmol/L — ABNORMAL HIGH (ref 96–106)
Creatinine, Ser: 0.85 mg/dL (ref 0.57–1.00)
Globulin, Total: 2.6 g/dL (ref 1.5–4.5)
Glucose: 51 mg/dL — ABNORMAL LOW (ref 70–99)
Potassium: 4 mmol/L (ref 3.5–5.2)
Sodium: 140 mmol/L (ref 134–144)
Total Protein: 6.8 g/dL (ref 6.0–8.5)
eGFR: 87 mL/min/{1.73_m2} (ref >59)

## 2023-07-01 LAB — LP+NON-HDL CHOLESTEROL
Cholesterol, Total: 147 mg/dL (ref 100–199)
HDL: 52 mg/dL (ref >39)
LDL Chol Calc (NIH): 77 mg/dL (ref 0–99)
Total Non-HDL-Chol (LDL+VLDL): 95 mg/dL (ref 0–129)
Triglycerides: 94 mg/dL (ref 0–149)
VLDL Cholesterol Cal: 18 mg/dL (ref 5–40)

## 2023-07-01 LAB — CBC WITH DIFFERENTIAL/PLATELET
Basophils Absolute: 0.1 10*3/uL (ref 0.0–0.2)
Basos: 1 %
EOS (ABSOLUTE): 0 10*3/uL (ref 0.0–0.4)
Eos: 0 %
Hematocrit: 39.2 % (ref 34.0–46.6)
Hemoglobin: 12.7 g/dL (ref 11.1–15.9)
Immature Grans (Abs): 0 10*3/uL (ref 0.0–0.1)
Immature Granulocytes: 0 %
Lymphocytes Absolute: 1.5 10*3/uL (ref 0.7–3.1)
Lymphs: 15 %
MCH: 25.9 pg — ABNORMAL LOW (ref 26.6–33.0)
MCHC: 32.4 g/dL (ref 31.5–35.7)
MCV: 80 fL (ref 79–97)
Monocytes Absolute: 0.5 10*3/uL (ref 0.1–0.9)
Monocytes: 5 %
Neutrophils Absolute: 7.8 10*3/uL — ABNORMAL HIGH (ref 1.4–7.0)
Neutrophils: 79 %
Platelets: 307 10*3/uL (ref 150–450)
RBC: 4.9 x10E6/uL (ref 3.77–5.28)
RDW: 13.5 % (ref 11.7–15.4)
WBC: 10 10*3/uL (ref 3.4–10.8)

## 2023-07-01 LAB — MICROALBUMIN / CREATININE URINE RATIO
Creatinine, Urine: 204.9 mg/dL
Microalb/Creat Ratio: 7 mg/g{creat} (ref 0–29)
Microalbumin, Urine: 14.5 ug/mL

## 2023-11-17 ENCOUNTER — Other Ambulatory Visit: Payer: Self-pay | Admitting: Family Medicine

## 2023-11-17 DIAGNOSIS — B3731 Acute candidiasis of vulva and vagina: Secondary | ICD-10-CM

## 2023-12-20 ENCOUNTER — Other Ambulatory Visit: Payer: Self-pay | Admitting: Family Medicine

## 2023-12-20 DIAGNOSIS — I152 Hypertension secondary to endocrine disorders: Secondary | ICD-10-CM

## 2024-01-05 ENCOUNTER — Encounter: Payer: Self-pay | Admitting: Family Medicine

## 2024-01-05 ENCOUNTER — Ambulatory Visit: Attending: Family Medicine | Admitting: Family Medicine

## 2024-01-05 VITALS — BP 135/87 | HR 99 | Temp 98.2°F | Ht 67.0 in | Wt 275.2 lb

## 2024-01-05 DIAGNOSIS — E1159 Type 2 diabetes mellitus with other circulatory complications: Secondary | ICD-10-CM | POA: Diagnosis not present

## 2024-01-05 DIAGNOSIS — I152 Hypertension secondary to endocrine disorders: Secondary | ICD-10-CM

## 2024-01-05 DIAGNOSIS — B3731 Acute candidiasis of vulva and vagina: Secondary | ICD-10-CM | POA: Diagnosis not present

## 2024-01-05 DIAGNOSIS — Z7985 Long-term (current) use of injectable non-insulin antidiabetic drugs: Secondary | ICD-10-CM

## 2024-01-05 DIAGNOSIS — Z1211 Encounter for screening for malignant neoplasm of colon: Secondary | ICD-10-CM

## 2024-01-05 DIAGNOSIS — Z7984 Long term (current) use of oral hypoglycemic drugs: Secondary | ICD-10-CM

## 2024-01-05 DIAGNOSIS — E1169 Type 2 diabetes mellitus with other specified complication: Secondary | ICD-10-CM | POA: Diagnosis not present

## 2024-01-05 DIAGNOSIS — Z794 Long term (current) use of insulin: Secondary | ICD-10-CM

## 2024-01-05 DIAGNOSIS — Z1231 Encounter for screening mammogram for malignant neoplasm of breast: Secondary | ICD-10-CM

## 2024-01-05 DIAGNOSIS — E785 Hyperlipidemia, unspecified: Secondary | ICD-10-CM

## 2024-01-05 DIAGNOSIS — F419 Anxiety disorder, unspecified: Secondary | ICD-10-CM

## 2024-01-05 LAB — POCT GLYCOSYLATED HEMOGLOBIN (HGB A1C): HbA1c, POC (controlled diabetic range): 7 % (ref 0.0–7.0)

## 2024-01-05 MED ORDER — VALSARTAN 320 MG PO TABS: 320.0000 mg | ORAL_TABLET | Freq: Every day | ORAL | 0 refills | Status: DC

## 2024-01-05 MED ORDER — BUSPIRONE HCL 15 MG PO TABS: 15.0000 mg | ORAL_TABLET | Freq: Two times a day (BID) | ORAL | 1 refills | Status: AC

## 2024-01-05 MED ORDER — TERCONAZOLE 0.8 % VA CREA
1.0000 | TOPICAL_CREAM | Freq: Every day | VAGINAL | 1 refills | Status: AC
Start: 2024-01-05 — End: ?

## 2024-01-05 MED ORDER — FLUOXETINE HCL 40 MG PO CAPS
40.0000 mg | ORAL_CAPSULE | Freq: Every day | ORAL | 1 refills | Status: AC
Start: 2024-01-05 — End: ?

## 2024-01-05 MED ORDER — AMLODIPINE BESYLATE 5 MG PO TABS: 5.0000 mg | ORAL_TABLET | Freq: Every day | ORAL | 0 refills | Status: DC

## 2024-01-05 MED ORDER — LANTUS SOLOSTAR 100 UNIT/ML ~~LOC~~ SOPN: 62.0000 [IU] | PEN_INJECTOR | Freq: Every day | SUBCUTANEOUS | 6 refills | Status: AC

## 2024-01-05 MED ORDER — ATORVASTATIN CALCIUM 80 MG PO TABS: 80.0000 mg | ORAL_TABLET | Freq: Every day | ORAL | 1 refills | Status: AC

## 2024-01-05 MED ORDER — FLUCONAZOLE 150 MG PO TABS: 150.0000 mg | ORAL_TABLET | Freq: Every day | ORAL | 1 refills | Status: AC | PRN

## 2024-01-05 MED ORDER — TIRZEPATIDE 15 MG/0.5ML ~~LOC~~ SOAJ: 15.0000 mg | SUBCUTANEOUS | 6 refills | Status: AC

## 2024-01-05 MED ORDER — GLIPIZIDE 10 MG PO TABS: 20.0000 mg | ORAL_TABLET | Freq: Two times a day (BID) | ORAL | 1 refills | Status: AC

## 2024-01-05 NOTE — Progress Notes (Signed)
 Subjective:  Patient ID: Lynn Anderson, female    DOB: 1980/12/20  Age: 43 y.o. MRN: [id number]  CC: Medical Management of Chronic Issues     Discussed the use of AI scribe software for clinical note transcription with the patient, who gave verbal consent to proceed.  History of Present Illness Lynn Anderson is a 43 year old female with a history of Type 2 DM, Hyperlipidemia, Hypertension, and obesity who presents for routine follow-up.  Her A1c increased to 7.0 from 6.1. She is taking Mounjaro  15 mg weekly, Lantus  62 units daily, glipizide  20 mg twice a day, and rarely needs Humalog . She has gained about seven pounds since May. Her appetite is decreased for about three days after each Mounjaro  injection and then increases before the next dose.  She uses fluconazole  as needed for yeast infections with a regimen of three consecutive days then weekly for three months if symptoms recur, and also uses triconazole vaginal cream. She is taking Prozac , Buspar , and atorvastatin  without problems.  She has occasional constipation that she attributes to medications. She manages this with prune juice and Activia yogurt and does not use laxatives. She has not seen a podiatrist and trims her own toenails.  She is due for mammogram, colonoscopy, and Pap smear. Her last eye exam was six months ago.    Past Medical History:  Diagnosis Date   Diabetes mellitus without complication (HCC)    Hyperlipidemia    Hypertension     Past Surgical History:  Procedure Laterality Date   CHOLECYSTECTOMY     Mirena       Inserted 10-16-14    Family History  Problem Relation Age of Onset   Hyperlipidemia Mother    Hypertension Mother    Diabetes Mother    Diabetes Maternal Grandmother    Heart disease Father    Diabetes Maternal Aunt     Social History   Socioeconomic History   Marital status: Single    Spouse name: Not on file   Number of children: Not on file   Years of  education: Not on file   Highest education level: Not on file  Occupational History   Not on file  Tobacco Use   Smoking status: Never   Smokeless tobacco: Never  Vaping Use   Vaping status: Never Used  Substance and Sexual Activity   Alcohol use: No    Alcohol/week: 0.0 standard drinks of alcohol   Drug use: No   Sexual activity: Not Currently    Birth control/protection: I.U.D.    Comment: Mirena   inserted 10-16-14-INTERCOURSE AGE 49,MORE THAN 5  SEXUAL PARTNERS  Other Topics Concern   Not on file  Social History Narrative   Not on file   Social Drivers of Health   Financial Resource Strain: High Risk (12/30/2022)   Overall Financial Resource Strain (CARDIA)    Difficulty of Paying Living Expenses: Very hard  Food Insecurity: No Food Insecurity (01/19/2023)   Hunger Vital Sign    Worried About Running Out of Food in the Last Year: Never true    Ran Out of Food in the Last Year: Never true  Recent Concern: Food Insecurity - Food Insecurity Present (12/30/2022)   Hunger Vital Sign    Worried About Running Out of Food in the Last Year: Sometimes true    Ran Out of Food in the Last Year: Sometimes true  Transportation Needs: No Transportation Needs (01/19/2023)   PRAPARE - Transportation    Lack  of Transportation (Medical): No    Lack of Transportation (Non-Medical): No  Physical Activity: Insufficiently Active (12/30/2022)   Exercise Vital Sign    Days of Exercise per Week: 2 days    Minutes of Exercise per Session: 30 min  Stress: Stress Concern Present (12/30/2022)   [employer] of Occupational Health - Occupational Stress Questionnaire    Feeling of Stress : To some extent  Social Connections: Socially Isolated (12/30/2022)   Social Connection and Isolation Panel    Frequency of Communication with Friends and Family: Three times a week    Frequency of Social Gatherings with Friends and Family: Once a week    Attends Religious Services: Never    Doctor, General Practice or Organizations: No    Attends Engineer, Structural: Never    Marital Status: Never married    Allergies  Allergen Reactions   Metformin  And Related Diarrhea    Outpatient Medications Prior to Visit  Medication Sig Dispense Refill   Blood Glucose Monitoring Suppl (ONE TOUCH ULTRA 2) w/Device KIT 1 each by Does not apply route 3 (three) times daily. 1 each 0   Continuous Blood Gluc Receiver (FREESTYLE LIBRE 2 READER) DEVI Use to check blood sugar three times daily. E11.65 1 each 0   Continuous Blood Gluc Sensor (FREESTYLE LIBRE 2 SENSOR) MISC Use to check blood sugar three times daily. Change sensors once every 2 weeks. E11.65 2 each 3   glucose blood (ONETOUCH VERIO) test strip Use to test blood sugar 2 times daily as instructed 100 each 2   ibuprofen  (ADVIL ) 800 MG tablet Take 1 tablet (800 mg total) by mouth 3 (three) times daily. 21 tablet 0   insulin  lispro (HUMALOG  KWIKPEN) 100 UNIT/ML KwikPen Inject 0-12 Units into the skin 3 (three) times daily. Per sliding scale 15 mL 11   Insulin  Pen Needle (BD PEN NEEDLE NANO U/F) 32G X 4 MM MISC Use as directed once daily to administer Victoza  & Lantus  100 each 3   Lancets (ONETOUCH ULTRASOFT) lancets Use as instructed 100 each 12   levonorgestrel  (MIRENA ) 20 MCG/24HR IUD 1 each by Intrauterine route once.     ONETOUCH DELICA LANCETS FINE MISC Use to test blood sugar 2 times daily as instructed. 100 each 2   amLODipine  (NORVASC ) 5 MG tablet Take 1 tablet (5 mg total) by mouth daily. 30 tablet 0   atorvastatin  (LIPITOR) 80 MG tablet Take 1 tablet (80 mg total) by mouth daily. 90 tablet 1   busPIRone  (BUSPAR ) 15 MG tablet Take 1 tablet (15 mg total) by mouth 2 (two) times daily. 180 tablet 1   fluconazole  (DIFLUCAN ) 150 MG tablet Take 1 tablet (150 mg total) by mouth daily as needed. 5 tablet 0   FLUoxetine  (PROZAC ) 40 MG capsule Take 1 capsule (40 mg total) by mouth daily. 180 capsule 1   glipiZIDE  (GLUCOTROL ) 10 MG tablet Take 2  tablets (20 mg total) by mouth 2 (two) times daily before a meal. 360 tablet 1   insulin  glargine (LANTUS  SOLOSTAR) 100 UNIT/ML Solostar Pen Inject 62 Units into the skin daily. Titrate by 2 units every fourth day until blood sugars are at goal to a maximum of 70 units daily 30 mL 6   terconazole  (TERAZOL 3 ) 0.8 % vaginal cream Place 1 applicator vaginally at bedtime. 20 g 1   tirzepatide  (MOUNJARO ) 15 MG/0.5ML Pen Inject 15 mg into the skin once a week. 6 mL 6   valsartan  (DIOVAN ) 320  MG tablet Take 1 tablet (320 mg total) by mouth daily. 30 tablet 0   No facility-administered medications prior to visit.     ROS Review of Systems  Constitutional:  Negative for activity change and appetite change.  HENT:  Negative for sinus pressure and sore throat.   Respiratory:  Negative for chest tightness, shortness of breath and wheezing.   Cardiovascular:  Negative for chest pain and palpitations.  Gastrointestinal:  Positive for constipation. Negative for abdominal distention and abdominal pain.  Genitourinary: Negative.   Musculoskeletal: Negative.   Psychiatric/Behavioral:  Negative for behavioral problems and dysphoric mood.     Objective:  BP 135/87   Pulse 99   Temp 98.2 F (36.8 C) (Oral)   Ht 5' 7 (1.702 m)   Wt 275 lb 3.2 oz (124.8 kg)   SpO2 99%   BMI 43.10 kg/m      01/05/2024    2:22 PM 06/30/2023    3:07 PM 12/30/2022    2:32 PM  BP/Weight  Systolic BP 135 115 148  Diastolic BP 87 77 94  Wt. (Lbs) 275.2 268.2 275.4  BMI 43.1 kg/m2 42.01 kg/m2 43.13 kg/m2      Physical Exam Constitutional:      Appearance: She is well-developed.  Cardiovascular:     Rate and Rhythm: Normal rate.     Heart sounds: Normal heart sounds. No murmur heard. Pulmonary:     Effort: Pulmonary effort is normal.     Breath sounds: Normal breath sounds. No wheezing or rales.  Chest:     Chest wall: No tenderness.  Abdominal:     General: Bowel sounds are normal. There is no distension.      Palpations: Abdomen is soft. There is no mass.     Tenderness: There is no abdominal tenderness.  Musculoskeletal:        General: Normal range of motion.     Right lower leg: No edema.     Left lower leg: No edema.  Neurological:     Mental Status: She is alert and oriented to person, place, and time.  Psychiatric:        Mood and Affect: Mood normal.        Latest Ref Rng & Units 06/30/2023    3:59 PM 09/23/2022    4:50 PM 06/17/2022    4:18 PM  CMP  Glucose 70 - 99 mg/dL 51  94    BUN 6 - 24 mg/dL 9  9    Creatinine 9.42 - 1.00 mg/dL 9.14  9.22    Sodium 865 - 144 mmol/L 140  142    Potassium 3.5 - 5.2 mmol/L 4.0  3.8  4.4   Chloride 96 - 106 mmol/L 107  106    CO2 20 - 29 mmol/L 19  22    Calcium  8.7 - 10.2 mg/dL 9.1  9.5    Total Protein 6.0 - 8.5 g/dL 6.8  7.1    Total Bilirubin 0.0 - 1.2 mg/dL 0.5  0.6    Alkaline Phos 44 - 121 IU/L 65  66    AST 0 - 40 IU/L 15  13    ALT 0 - 32 IU/L 18  19      Lipid Panel     Component Value Date/Time   CHOL 147 06/30/2023 1559   TRIG 94 06/30/2023 1559   HDL 52 06/30/2023 1559   CHOLHDL 4.4 06/05/2020 1058   CHOLHDL 5.4 (H) 10/07/2016 1131   VLDL  55 (H) 10/07/2016 1131   LDLCALC 77 06/30/2023 1559    CBC    Component Value Date/Time   WBC 10.0 06/30/2023 1559   WBC 8.4 07/21/2017 1230   RBC 4.90 06/30/2023 1559   RBC 5.45 (H) 07/21/2017 1230   HGB 12.7 06/30/2023 1559   HCT 39.2 06/30/2023 1559   PLT 307 06/30/2023 1559   MCV 80 06/30/2023 1559   MCH 25.9 (L) 06/30/2023 1559   MCH 25.3 (L) 07/21/2017 1230   MCHC 32.4 06/30/2023 1559   MCHC 32.3 07/21/2017 1230   RDW 13.5 06/30/2023 1559   LYMPHSABS 1.5 06/30/2023 1559   MONOABS 672 10/07/2016 1131   EOSABS 0.0 06/30/2023 1559   BASOSABS 0.1 06/30/2023 1559    Lab Results  Component Value Date   HGBA1C 7.0 01/05/2024    Lab Results  Component Value Date   HGBA1C 7.0 01/05/2024   HGBA1C 6.1 06/30/2023   HGBA1C 7.5 (A) 12/30/2022        Assessment & Plan Type 2 diabetes mellitus with other specified complication A1c increased to 7.0, weight gain noted, appetite fluctuates with Mounjaro . Medications and insurance coverage stable. - Continue Mounjaro  15 mg subcutaneously weekly. - Continue Lantus  62 units subcutaneously daily. - Continue Glipizide  20 mg orally twice daily. - Monitor blood glucose levels regularly. - Report any medication access or insurance issues.  Hyperlipidemia associated with type 2 diabetes mellitus Cholesterol levels normal in May, no repeat lipid panel needed. - Continue atorvastatin  80 mg orally daily. - Ordered comprehensive metabolic panel.  Candidiasis of vulva and vagina Requests refills for management of yeast infections. - Refilled fluconazole  150 mg orally as needed. - Refilled terconazole  0.8% vaginal cream at bedtime.  Anxiety Continues on current medications. - Continue fluoxetine  40 mg orally daily. - Continue buspirone  15 mg orally twice daily.   Hypertension associated with type 2 diabetes mellitus Controlled - Continue antihypertensives -Counseled on blood pressure goal of less than 130/80, low-sodium, DASH diet, medication compliance, 150 minutes of moderate intensity exercise per week. Discussed medication compliance, adverse effects.    General Health Maintenance Due for mammogram and colonoscopy. Pap smear not up to date. - Ordered mammogram. - Scheduled colonoscopy with nurse visit for preparation. - Advised to schedule Pap smear with GYN.      Meds ordered this encounter  Medications   amLODipine  (NORVASC ) 5 MG tablet    Sig: Take 1 tablet (5 mg total) by mouth daily.    Dispense:  30 tablet    Refill:  0    Keep upcoming office visit for additional refills.   fluconazole  (DIFLUCAN ) 150 MG tablet    Sig: Take 1 tablet (150 mg total) by mouth daily as needed.    Dispense:  15 tablet    Refill:  1   valsartan  (DIOVAN ) 320 MG tablet    Sig: Take 1  tablet (320 mg total) by mouth daily.    Dispense:  30 tablet    Refill:  0    Keep upcoming office visit for additional refills.   atorvastatin  (LIPITOR) 80 MG tablet    Sig: Take 1 tablet (80 mg total) by mouth daily.    Dispense:  90 tablet    Refill:  1   busPIRone  (BUSPAR ) 15 MG tablet    Sig: Take 1 tablet (15 mg total) by mouth 2 (two) times daily.    Dispense:  180 tablet    Refill:  1   FLUoxetine  (PROZAC ) 40 MG capsule  Sig: Take 1 capsule (40 mg total) by mouth daily.    Dispense:  180 capsule    Refill:  1   glipiZIDE  (GLUCOTROL ) 10 MG tablet    Sig: Take 2 tablets (20 mg total) by mouth 2 (two) times daily before a meal.    Dispense:  360 tablet    Refill:  1   insulin  glargine (LANTUS  SOLOSTAR) 100 UNIT/ML Solostar Pen    Sig: Inject 62 Units into the skin daily. Titrate by 2 units every fourth day until blood sugars are at goal to a maximum of 70 units daily    Dispense:  30 mL    Refill:  6    Dose increase   terconazole  (TERAZOL 3 ) 0.8 % vaginal cream    Sig: Place 1 applicator vaginally at bedtime.    Dispense:  20 g    Refill:  1   tirzepatide  (MOUNJARO ) 15 MG/0.5ML Pen    Sig: Inject 15 mg into the skin once a week.    Dispense:  6 mL    Refill:  6    Follow-up: Return in about 6 months (around 07/04/2024).       Corrina Sabin, MD, FAAFP. [facility] [place], [place] [phone]   01/05/2024, 4:59 PM

## 2024-01-05 NOTE — Patient Instructions (Signed)
 VISIT SUMMARY:  Today, you came in for a routine follow-up for your type 2 diabetes. We discussed your recent lab results, medication regimen, and some other health concerns. We also reviewed your general health maintenance and scheduled necessary screenings.  YOUR PLAN:  -TYPE 2 DIABETES MELLITUS WITH HYPERGLYCEMIA: Your A1c level has increased to 7.0, indicating that your blood sugar levels have been higher than desired. We will continue your current medications: Mounjaro  15 mg weekly, Lantus  62 units daily, and Glipizide  20 mg twice daily. Please monitor your blood glucose levels regularly and report any issues with medication access or insurance.  -HYPERLIPIDEMIA ASSOCIATED WITH TYPE 2 DIABETES MELLITUS: Your cholesterol levels were normal in May, so no repeat lipid panel is needed at this time. Continue taking atorvastatin  80 mg daily. We have ordered a comprehensive metabolic panel to monitor your overall health.  -ACUTE CANDIDIASIS OF VULVA AND VAGINA: You have been experiencing yeast infections. We have refilled your prescriptions for fluconazole  150 mg as needed and terconazole  0.8% vaginal cream to use at bedtime.  -ANXIETY: You are continuing with your current medications for anxiety: fluoxetine  40 mg daily and buspirone  15 mg twice daily. No changes were made to this regimen.  -CONSTIPATION: You have occasional constipation, which you manage with prune juice and Activia yogurt. If needed, you can also use Miralax as an over-the-counter option.  -GENERAL HEALTH MAINTENANCE: You are due for a mammogram, colonoscopy, and Pap smear. We have ordered a mammogram and scheduled a colonoscopy with a nurse visit for preparation. Please schedule a Pap smear with your gynecologist.  INSTRUCTIONS:  Please monitor your blood glucose levels regularly and report any issues with medication access or insurance. We have ordered a mammogram and scheduled a colonoscopy with a nurse visit for preparation.  Please also schedule a Pap smear with your gynecologist.

## 2024-01-06 LAB — CMP14+EGFR
ALT: 19 [IU]/L (ref 0–32)
AST: 17 [IU]/L (ref 0–40)
Albumin: 4 g/dL (ref 3.9–4.9)
Alkaline Phosphatase: 64 [IU]/L (ref 41–116)
BUN/Creatinine Ratio: 14 (ref 9–23)
BUN: 10 mg/dL (ref 6–24)
Bilirubin Total: 0.4 mg/dL (ref 0.0–1.2)
CO2: 22 mmol/L (ref 20–29)
Calcium: 9.3 mg/dL (ref 8.7–10.2)
Chloride: 107 mmol/L — ABNORMAL HIGH (ref 96–106)
Creatinine, Ser: 0.74 mg/dL (ref 0.57–1.00)
Globulin, Total: 2.8 g/dL (ref 1.5–4.5)
Glucose: 61 mg/dL — ABNORMAL LOW (ref 70–99)
Potassium: 3.9 mmol/L (ref 3.5–5.2)
Sodium: 142 mmol/L (ref 134–144)
Total Protein: 6.8 g/dL (ref 6.0–8.5)
eGFR: 102 mL/min/{1.73_m2}

## 2024-01-07 ENCOUNTER — Ambulatory Visit: Payer: Self-pay | Admitting: Family Medicine

## 2024-01-31 ENCOUNTER — Encounter: Payer: Self-pay | Admitting: Family Medicine

## 2024-02-02 ENCOUNTER — Telehealth: Payer: Self-pay | Admitting: Family Medicine

## 2024-02-02 NOTE — Telephone Encounter (Signed)
 Contacted pt left vm to confirmed appt

## 2024-02-04 ENCOUNTER — Ambulatory Visit: Payer: Self-pay | Attending: *Deleted | Admitting: *Deleted

## 2024-02-04 ENCOUNTER — Encounter: Payer: Self-pay | Admitting: *Deleted

## 2024-02-04 ENCOUNTER — Other Ambulatory Visit: Payer: Self-pay

## 2024-02-04 VITALS — BP 140/85 | HR 84 | Ht 67.0 in | Wt 276.0 lb

## 2024-02-04 DIAGNOSIS — R102 Pelvic and perineal pain unspecified side: Secondary | ICD-10-CM

## 2024-02-04 DIAGNOSIS — M546 Pain in thoracic spine: Secondary | ICD-10-CM

## 2024-02-04 MED ORDER — IBUPROFEN 800 MG PO TABS
800.0000 mg | ORAL_TABLET | Freq: Three times a day (TID) | ORAL | 0 refills | Status: AC | PRN
  Filled 2024-02-04: qty 30, 10d supply, fill #0

## 2024-02-04 MED ORDER — CYCLOBENZAPRINE HCL 10 MG PO TABS
10.0000 mg | ORAL_TABLET | Freq: Every day | ORAL | 0 refills | Status: AC
  Filled 2024-02-04: qty 30, 30d supply, fill #0

## 2024-02-04 NOTE — Progress Notes (Signed)
 "   Patient ID: Lynn Anderson, female    DOB: Jun 13, 1980  MRN: [id number]  CC: Abdominal Pain (Abdominal, back & right sided flank pain X2 weeks)   Subjective: Lynn Anderson is a 43 y.o. female who presents for evaluation of right upper chest wall pain that radiates to the mid back (thoracic) and pelvic cramping (IUD use) She denies obvious injury to the chest wall or mid back She has tried over-the-counter ibuprofen  (200 mg) without improvement.  She has not tried a muscle relaxer or heat. She  reports her pain is brought on by sitting.   She denies persistent severe nighttime pain, numbness or tingling, weakness, instability, limited joint motion, significant swelling or warmth, unexplained deformity or loss of bowel or bladder function   Her pelvic cramping is intermittent .   She is not aware of what brings the cramping on. It is not associated with fever, chills, nausea, vomiting, diarrhea, constipation, vaginal discharge, itching or burning. It reminds her of the cramps that she used to have with her period.  She has not had a period since having her IUD placed.  Her concerns today include:  Type 2 diabetes  Hypertension associated with diabetes Hyperlipidemia associated with type 2 diabetes mellitus Vaginal candidiasis Anxiety Long-term current use of insulin     Patient Active Problem List   Diagnosis Date Noted   PTSD (post-traumatic stress disorder) 06/25/2022   Anxiety 08/27/2021   Class 3 severe obesity due to excess calories with serious comorbidity and body mass index (BMI) of 40.0 to 44.9 in adult [facility]) 11/22/2017   Hyperlipidemia 08/20/2017   Hypertension 08/20/2017   Type 2 diabetes mellitus with hyperglycemia (HCC) 11/05/2014   IUD (intrauterine device) in place 10/16/2014     Medications Ordered Prior to Encounter[1]  Allergies[2]  Social History   Socioeconomic History   Marital status: Single    Spouse name: Not on file   Number of children:  Not on file   Years of education: Not on file   Highest education level: Not on file  Occupational History   Not on file  Tobacco Use   Smoking status: Never   Smokeless tobacco: Never  Vaping Use   Vaping status: Never Used  Substance and Sexual Activity   Alcohol use: No    Alcohol/week: 0.0 standard drinks of alcohol   Drug use: No   Sexual activity: Not Currently    Birth control/protection: I.U.D.    Comment: Mirena   inserted 10-16-14-INTERCOURSE AGE 43,MORE THAN 5  SEXUAL PARTNERS  Other Topics Concern   Not on file  Social History Narrative   Not on file   Social Drivers of Health   Tobacco Use: Low Risk (01/05/2024)   Patient History    Smoking Tobacco Use: Never    Smokeless Tobacco Use: Never    Passive Exposure: Not on file  Financial Resource Strain: High Risk (12/30/2022)   Overall Financial Resource Strain (CARDIA)    Difficulty of Paying Living Expenses: Very hard  Food Insecurity: No Food Insecurity (01/19/2023)   Hunger Vital Sign    Worried About Running Out of Food in the Last Year: Never true    Ran Out of Food in the Last Year: Never true  Recent Concern: Food Insecurity - Food Insecurity Present (12/30/2022)   Hunger Vital Sign    Worried About Running Out of Food in the Last Year: Sometimes true    Ran Out of Food in the Last Year: Sometimes true  Transportation  Needs: No Transportation Needs (01/19/2023)   PRAPARE - Administrator, Civil Service (Medical): No    Lack of Transportation (Non-Medical): No  Physical Activity: Insufficiently Active (12/30/2022)   Exercise Vital Sign    Days of Exercise per Week: 2 days    Minutes of Exercise per Session: 30 min  Stress: Stress Concern Present (12/30/2022)   [employer] of Occupational Health - Occupational Stress Questionnaire    Feeling of Stress : To some extent  Social Connections: Socially Isolated (12/30/2022)   Social Connection and Isolation Panel    Frequency of  Communication with Friends and Family: Three times a week    Frequency of Social Gatherings with Friends and Family: Once a week    Attends Religious Services: Never    Database Administrator or Organizations: No    Attends Banker Meetings: Never    Marital Status: Never married  Intimate Partner Violence: Not At Risk (12/30/2022)   Humiliation, Afraid, Rape, and Kick questionnaire    Fear of Current or Ex-Partner: No    Emotionally Abused: No    Physically Abused: No    Sexually Abused: No  Depression (PHQ2-9): Medium Risk (01/05/2024)   Depression (PHQ2-9)    PHQ-2 Score: 5  Alcohol Screen: Low Risk (12/30/2022)   Alcohol Screen    Last Alcohol Screening Score (AUDIT): 0  Housing: Low Risk (01/19/2023)   Housing    Last Housing Risk Score: 0  Utilities: Not At Risk (12/30/2022)   AHC Utilities    Threatened with loss of utilities: No  Health Literacy: Adequate Health Literacy (12/30/2022)   B1300 Health Literacy    Frequency of need for help with medical instructions: Never    Family History  Problem Relation Age of Onset   Hyperlipidemia Mother    Hypertension Mother    Diabetes Mother    Diabetes Maternal Grandmother    Heart disease Father    Diabetes Maternal Aunt     Past Surgical History:  Procedure Laterality Date   CHOLECYSTECTOMY     Mirena       Inserted 10-16-14    ROS: Review of Systems Negative except as stated above  PHYSICAL EXAM: BP (!) 140/85 (BP Location: Left Arm, Patient Position: Sitting, Cuff Size: Normal)   Pulse 84   Ht 5' 7 (1.702 m)   Wt 276 lb (125.2 kg)   SpO2 100%   BMI 43.23 kg/m   Physical Exam Vitals and nursing note reviewed.  Constitutional:      Appearance: Normal appearance. She is well-developed.  Cardiovascular:     Rate and Rhythm: Normal rate and regular rhythm.  Pulmonary:     Effort: Pulmonary effort is normal.     Breath sounds: Normal breath sounds.  Abdominal:     Comments: Benign with bowel  sounds present all 4 quadrants.  No masses or going to megaly.  No rebound or peritonitis.  No CVA tenderness or suprapubic tenderness  Musculoskeletal:     Comments: No pain with forward or lateral flexion  Skin:    General: Skin is warm and dry.  Neurological:     Mental Status: She is alert and oriented to person, place, and time. Mental status is at baseline.     Gait: Gait normal.     Comments: Negative straight leg raise          Latest Ref Rng & Units 01/05/2024    3:07 PM 06/30/2023    3:59  PM 09/23/2022    4:50 PM  CMP  Glucose 70 - 99 mg/dL 61  51  94   BUN 6 - 24 mg/dL 10  9  9    Creatinine 0.57 - 1.00 mg/dL 9.25  9.14  9.22   Sodium 134 - 144 mmol/L 142  140  142   Potassium 3.5 - 5.2 mmol/L 3.9  4.0  3.8   Chloride 96 - 106 mmol/L 107  107  106   CO2 20 - 29 mmol/L 22  19  22    Calcium  8.7 - 10.2 mg/dL 9.3  9.1  9.5   Total Protein 6.0 - 8.5 g/dL 6.8  6.8  7.1   Total Bilirubin 0.0 - 1.2 mg/dL 0.4  0.5  0.6   Alkaline Phos 41 - 116 IU/L 64  65  66   AST 0 - 40 IU/L 17  15  13    ALT 0 - 32 IU/L 19  18  19     Lipid Panel     Component Value Date/Time   CHOL 147 06/30/2023 1559   TRIG 94 06/30/2023 1559   HDL 52 06/30/2023 1559   CHOLHDL 4.4 06/05/2020 1058   CHOLHDL 5.4 (H) 10/07/2016 1131   VLDL 55 (H) 10/07/2016 1131   LDLCALC 77 06/30/2023 1559    CBC    Component Value Date/Time   WBC 10.0 06/30/2023 1559   WBC 8.4 07/21/2017 1230   RBC 4.90 06/30/2023 1559   RBC 5.45 (H) 07/21/2017 1230   HGB 12.7 06/30/2023 1559   HCT 39.2 06/30/2023 1559   PLT 307 06/30/2023 1559   MCV 80 06/30/2023 1559   MCH 25.9 (L) 06/30/2023 1559   MCH 25.3 (L) 07/21/2017 1230   MCHC 32.4 06/30/2023 1559   MCHC 32.3 07/21/2017 1230   RDW 13.5 06/30/2023 1559   LYMPHSABS 1.5 06/30/2023 1559   MONOABS 672 10/07/2016 1131   EOSABS 0.0 06/30/2023 1559   BASOSABS 0.1 06/30/2023 1559    Results for orders placed or performed in visit on 01/05/24  POCT glycosylated  hemoglobin (Hb A1C)   Collection Time: 01/05/24  2:31 PM  Result Value Ref Range   Hemoglobin A1C     HbA1c POC (<> result, manual entry)     HbA1c, POC (prediabetic range)     HbA1c, POC (controlled diabetic range) 7.0 0.0 - 7.0 %  CMP14+EGFR   Collection Time: 01/05/24  3:07 PM  Result Value Ref Range   Glucose 61 (L) 70 - 99 mg/dL   BUN 10 6 - 24 mg/dL   Creatinine, Ser 9.25 0.57 - 1.00 mg/dL   eGFR 897 >40 fO/fpw/8.26   BUN/Creatinine Ratio 14 9 - 23   Sodium 142 134 - 144 mmol/L   Potassium 3.9 3.5 - 5.2 mmol/L   Chloride 107 (H) 96 - 106 mmol/L   CO2 22 20 - 29 mmol/L   Calcium  9.3 8.7 - 10.2 mg/dL   Total Protein 6.8 6.0 - 8.5 g/dL   Albumin 4.0 3.9 - 4.9 g/dL   Globulin, Total 2.8 1.5 - 4.5 g/dL   Bilirubin Total 0.4 0.0 - 1.2 mg/dL   Alkaline Phosphatase 64 41 - 116 IU/L   AST 17 0 - 40 IU/L   ALT 19 0 - 32 IU/L     ASSESSMENT AND PLAN:  Assessment & Plan Acute right-sided thoracic back pain Brought on by sitting.  No injury to the chest wall or mid back.  No improvement with ibuprofen  (200 mg).  Has not tried a muscle relaxer or heat.  No persistent severe nighttime pain, numbness or tingling, weakness, instability, limited joint motion, significant swelling or warmth, unexplained deformity or loss of bowel or bladder function  Most likely musculoskeletal in nature.  Will try ibuprofen  800 mg 3 times a day as needed with food and cyclobenzaprine  10 mg at at bedtime as needed. She is encouraged to use a heating pad or ice whichever is more comfortable and to notify the clinic if her symptoms persist or worsen Orders:   ibuprofen  (ADVIL ) 800 MG tablet; Take 1 tablet (800 mg total) by mouth every 8 (eight) hours as needed.   cyclobenzaprine  (FLEXERIL ) 10 MG tablet; Take 1 tablet (10 mg total) by mouth at bedtime.  Pelvic cramping intermittent without exacerbating factors.  These symptoms remind her of the cramps that she used to have with her period. No period since  having her IUD placed. They are not associated with fever, chills, nausea, vomiting, diarrhea, constipation, vaginal discharge, itching or burning. She was encouraged to continue to watch the symptoms and notify her GYN if they persist I think she will probably benefit from the ibuprofen  that she is using for her back pain and I think the heating pad would be fine as well        Patient was given the opportunity to ask questions.  Patient verbalized understanding of the plan and was able to repeat key elements of the plan.   This documentation was completed using Paediatric nurse.  Any transcriptional errors are unintentional.     Requested Prescriptions   Signed Prescriptions Disp Refills   ibuprofen  (ADVIL ) 800 MG tablet 30 tablet 0    Sig: Take 1 tablet (800 mg total) by mouth every 8 (eight) hours as needed.   cyclobenzaprine  (FLEXERIL ) 10 MG tablet 30 tablet 0    Sig: Take 1 tablet (10 mg total) by mouth at bedtime.    No follow-ups on file.  Monti Villers H, NP     [1]  Current Outpatient Medications on File Prior to Visit  Medication Sig Dispense Refill   amLODipine  (NORVASC ) 5 MG tablet Take 1 tablet (5 mg total) by mouth daily. 30 tablet 0   atorvastatin  (LIPITOR) 80 MG tablet Take 1 tablet (80 mg total) by mouth daily. 90 tablet 1   Blood Glucose Monitoring Suppl (ONE TOUCH ULTRA 2) w/Device KIT 1 each by Does not apply route 3 (three) times daily. 1 each 0   busPIRone  (BUSPAR ) 15 MG tablet Take 1 tablet (15 mg total) by mouth 2 (two) times daily. 180 tablet 1   Continuous Blood Gluc Receiver (FREESTYLE LIBRE 2 READER) DEVI Use to check blood sugar three times daily. E11.65 1 each 0   Continuous Blood Gluc Sensor (FREESTYLE LIBRE 2 SENSOR) MISC Use to check blood sugar three times daily. Change sensors once every 2 weeks. E11.65 2 each 3   fluconazole  (DIFLUCAN ) 150 MG tablet Take 1 tablet (150 mg total) by mouth daily as needed. 15 tablet 1    FLUoxetine  (PROZAC ) 40 MG capsule Take 1 capsule (40 mg total) by mouth daily. 180 capsule 1   glipiZIDE  (GLUCOTROL ) 10 MG tablet Take 2 tablets (20 mg total) by mouth 2 (two) times daily before a meal. 360 tablet 1   glucose blood (ONETOUCH VERIO) test strip Use to test blood sugar 2 times daily as instructed 100 each 2   insulin  glargine (LANTUS  SOLOSTAR) 100 UNIT/ML Solostar Pen Inject 62 Units  into the skin daily. Titrate by 2 units every fourth day until blood sugars are at goal to a maximum of 70 units daily 30 mL 6   insulin  lispro (HUMALOG  KWIKPEN) 100 UNIT/ML KwikPen Inject 0-12 Units into the skin 3 (three) times daily. Per sliding scale 15 mL 11   Insulin  Pen Needle (BD PEN NEEDLE NANO U/F) 32G X 4 MM MISC Use as directed once daily to administer Victoza  & Lantus  100 each 3   Lancets (ONETOUCH ULTRASOFT) lancets Use as instructed 100 each 12   levonorgestrel  (MIRENA ) 20 MCG/24HR IUD 1 each by Intrauterine route once.     ONETOUCH DELICA LANCETS FINE MISC Use to test blood sugar 2 times daily as instructed. 100 each 2   terconazole  (TERAZOL 3 ) 0.8 % vaginal cream Place 1 applicator vaginally at bedtime. 20 g 1   tirzepatide  (MOUNJARO ) 15 MG/0.5ML Pen Inject 15 mg into the skin once a week. 6 mL 6   valsartan  (DIOVAN ) 320 MG tablet Take 1 tablet (320 mg total) by mouth daily. 30 tablet 0   No current facility-administered medications on file prior to visit.  [2]  Allergies Allergen Reactions   Metformin  And Related Diarrhea   "

## 2024-02-04 NOTE — Patient Instructions (Signed)
 We discussed your right rib pain radiating to the mid back that has been a problem for about 2 weeks.   There was no related injury.  It hurts worse when you lean back in your chair but are able to do your activities of daily living without difficulty.  No urinary frequency which usually is associated with your UTIs Your urine specimen was normal today. We decided that your pain may be related to musculoskeletal strain or sprain and will try prescription strength ibuprofen  and a muscle relaxer as well as heat or ice whichever is more comfortable We also discussed pelvic cramping while on your IUD.  This symptom is very common.   If your symptoms become more severe or associated with heavy bleeding that would signal a complication like displacement of the IUD and you would need to be seen by GYN Okay to try ibuprofen  prescription strength for this as well along with a heating pad. Call the clinic for worsening or persistent symptoms, or proceed to the emergency room or urgent care for further evaluation

## 2024-02-25 ENCOUNTER — Other Ambulatory Visit: Payer: Self-pay | Admitting: Family Medicine

## 2024-02-25 DIAGNOSIS — E1159 Type 2 diabetes mellitus with other circulatory complications: Secondary | ICD-10-CM

## 2024-07-05 ENCOUNTER — Ambulatory Visit: Admitting: Family Medicine
